# Patient Record
Sex: Male | Born: 1970 | Race: Black or African American | Hispanic: No | Marital: Single | Smoking: Current every day smoker
Health system: Southern US, Community
[De-identification: ages and names within clinical notes are randomized; demographics above are authoritative.]

## PROBLEM LIST (undated history)

## (undated) ENCOUNTER — Emergency Department (HOSPITAL_COMMUNITY): Payer: No Typology Code available for payment source

## (undated) DIAGNOSIS — F1910 Other psychoactive substance abuse, uncomplicated: Secondary | ICD-10-CM

## (undated) DIAGNOSIS — I10 Essential (primary) hypertension: Secondary | ICD-10-CM

## (undated) DIAGNOSIS — F1010 Alcohol abuse, uncomplicated: Secondary | ICD-10-CM

## (undated) HISTORY — DX: Essential (primary) hypertension: I10

## (undated) HISTORY — DX: Other psychoactive substance abuse, uncomplicated: F19.10

---

## 1999-04-01 ENCOUNTER — Encounter: Payer: Self-pay | Admitting: Emergency Medicine

## 1999-04-01 ENCOUNTER — Emergency Department (HOSPITAL_COMMUNITY): Admission: EM | Admit: 1999-04-01 | Discharge: 1999-04-01 | Payer: Self-pay | Admitting: Emergency Medicine

## 1999-04-02 ENCOUNTER — Ambulatory Visit (HOSPITAL_BASED_OUTPATIENT_CLINIC_OR_DEPARTMENT_OTHER): Admission: RE | Admit: 1999-04-02 | Discharge: 1999-04-02 | Payer: Self-pay | Admitting: Oral Surgery

## 2004-02-14 ENCOUNTER — Emergency Department: Payer: Self-pay | Admitting: Emergency Medicine

## 2008-06-18 ENCOUNTER — Emergency Department (HOSPITAL_COMMUNITY): Admission: EM | Admit: 2008-06-18 | Discharge: 2008-06-18 | Payer: Self-pay | Admitting: Emergency Medicine

## 2008-09-09 ENCOUNTER — Emergency Department (HOSPITAL_COMMUNITY): Admission: EM | Admit: 2008-09-09 | Discharge: 2008-09-09 | Payer: Self-pay | Admitting: Emergency Medicine

## 2009-12-02 ENCOUNTER — Emergency Department (HOSPITAL_COMMUNITY): Admission: AC | Admit: 2009-12-02 | Discharge: 2009-12-02 | Payer: Self-pay

## 2010-05-01 LAB — COMPREHENSIVE METABOLIC PANEL
ALT: 21 U/L (ref 0–53)
AST: 24 U/L (ref 0–37)
Albumin: 4.2 g/dL (ref 3.5–5.2)
Alkaline Phosphatase: 54 U/L (ref 39–117)
CO2: 23 meq/L (ref 19–32)
Chloride: 111 meq/L (ref 96–112)
Creatinine, Ser: 1.01 mg/dL (ref 0.4–1.5)
GFR calc Af Amer: >60 mL/min (ref >60)
GFR calc non Af Amer: >60 mL/min (ref >60)
Potassium: 3.5 meq/L (ref 3.5–5.1)
Total Bilirubin: 0.5 mg/dL (ref 0.3–1.2)

## 2010-05-01 LAB — POCT I-STAT, CHEM 8
BUN: 9 mg/dL (ref 6–23)
Calcium, Ion: 1.06 mmol/L — ABNORMAL LOW (ref 1.12–1.32)
Chloride: 110 meq/L (ref 96–112)
Creatinine, Ser: 1.3 mg/dL (ref 0.4–1.5)
Glucose, Bld: 101 mg/dL — ABNORMAL HIGH (ref 70–99)
HCT: 39 % (ref 39.0–52.0)
Potassium: 3.6 meq/L (ref 3.5–5.1)

## 2010-05-01 LAB — TYPE AND SCREEN
ABO/RH(D): O POS
Antibody Screen: NEGATIVE
Unit division: 0
Unit division: 0

## 2010-05-01 LAB — CBC
Hemoglobin: 12.9 g/dL — ABNORMAL LOW (ref 13.0–17.0)
MCH: 29.7 pg (ref 26.0–34.0)
Platelets: 287 10*3/uL (ref 150–400)
RBC: 4.35 MIL/uL (ref 4.22–5.81)
WBC: 4.2 10*3/uL (ref 4.0–10.5)

## 2010-05-26 LAB — RAPID URINE DRUG SCREEN, HOSP PERFORMED
Amphetamines: NOT DETECTED
Benzodiazepines: NOT DETECTED
Cocaine: POSITIVE — AB

## 2010-05-26 LAB — POCT CARDIAC MARKERS
CKMB, poc: 1.6 ng/mL (ref 1.0–8.0)
Myoglobin, poc: 233 ng/mL (ref 12–200)

## 2010-05-28 LAB — DIFFERENTIAL
Basophils Relative: 1 % (ref 0–1)
Monocytes Relative: 8 % (ref 3–12)
Neutro Abs: 3.4 10*3/uL (ref 1.7–7.7)
Neutrophils Relative %: 62 % (ref 43–77)

## 2010-05-28 LAB — CBC
MCHC: 35.2 g/dL (ref 30.0–36.0)
RBC: 4.53 MIL/uL (ref 4.22–5.81)
WBC: 5.4 10*3/uL (ref 4.0–10.5)

## 2010-05-28 LAB — POCT CARDIAC MARKERS
CKMB, poc: 1.4 ng/mL (ref 1.0–8.0)
Myoglobin, poc: 305 ng/mL (ref 12–200)
Troponin i, poc: <0.05 ng/mL (ref 0.00–0.09)

## 2010-05-28 LAB — POCT I-STAT, CHEM 8
Chloride: 109 mEq/L (ref 96–112)
Glucose, Bld: 105 mg/dL — ABNORMAL HIGH (ref 70–99)
HCT: 39 % (ref 39.0–52.0)
Potassium: 3.7 mEq/L (ref 3.5–5.1)

## 2010-05-28 LAB — CK TOTAL AND CKMB (NOT AT ARMC): Relative Index: 0.4 (ref 0.0–2.5)
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
## 2010-07-05 NOTE — Op Note (Signed)
[place]  Patient:    Mark Thornton, Mark Thornton                    MRN: [id number] Proc. Date: 04/02/99 Adm. Date:  [id number] Attending:  Rebeca Alert                           Operative Report  PREOPERATIVE DIAGNOSES: 1. Fracture of right body of mandible. 2. Three abscessed teeth, #21, #30, and the fractured tooth in the fractured site,    #32.  PROCEDURES: 1. Surgical removal of teeth #21, #30, and #32. 2. Closed reduction of fractured right body of mandible with Erich arch bars and    intermaxillary fixation.  ANESTHESIA:  General.  SURGEON:  Hinton Dyer, D.D.S.  ESTIMATED BLOOD LOSS:  Approximately 100 cc.  CONDITION AT THE END OF SURGERY:  Good.  DESCRIPTION OF PROCEDURE:  Following preoperative medication, the patient was brought to the operating room in a supine position, which he remained in throughout the whole procedure.  He was intubated by a left nasal endotracheal tube and then prepped and draped in the usual fashion for an oral procedure.  The mouth was suctioned.  A 4 x 4, open, moist gauze was placed around the endotracheal tube, and the mouth was prepared with Betadine scrub and paint.  Then 1.5 cc of 2% Xylocaine with 1:100,000 epinephrine was infiltrated in the medial aspect of the mandible on the right and left side, and attention was then turned to tooth #21.  A flap was elevated as the tooth was below the gingival margin.  Some bone was removed using a round bur and copious irrigation, and a purchase point was placed into the root. The tooth was gently removed from the socket using a ______ elevator.  The socket was curetted out, including a large, periapical area beneath it.  After copious  irrigation, the area was closed with 3-0 chromic sutures.  Attention was then turned to the right side.  A ______ elevator was placed around tooth #30, and it was removed from the socket with gentle pressure,  irrigating the socket out, and then curettaging the large periapical defects below the roots.  Then copious irrigation was used, and it was closed with 3-0 chromic sutures. Attention was hen turned to the mandibular third molar, which had been split through the fracture  site, and a the lower universal forceps were used to remove the tooth gently. he tooth came out in two pieces, removing the mesial root and then the distal root, then the area was irrigated out, and closed with multiple 3-0 chromic sutures. The mandible was check for integrity and found that the fracture had not displaced.  Attention was then turned to the maxilla, and a 26 gauge stainless steel wire and two curved clamps were used to measure the arch.  An Erich arch bar was then cut to fit the area from tooth #3 to tooth #14.  The Erich arch bar was then fitted to the area, and 24 gauge stainless steel wires were passed around teeth #3, #5, #6, #7, #8, #9, #10, #11, #12, #13, #14, and around the [facility] arch bar, and the tails were rotated into placed, turning them clockwise in the mucobuccal fold.  At that point, attention was then turned to the mandible, and again, a stainless steel wire was used to measure the mandibular arch, extending from tooth #31 to tooth #  18. Once fitted to the area, the wire was passed around teeth #18, #21, #22, #23, #24, #25, #26, #27, #28, #29, and #31.  The wires were then tied around the [facility] arch bar, turned into the mucobuccal fold, and cut appropriately.  The arch bar was checked for stability, and the patient was placed into intermaxillary fixation after removing the throat pack.  Four intermaxillary fixation wires were used, and the mouth was irrigated out.  Also, a [place] sump was placed just to check the integrity that the patient was n.p.o., and then the patient was then extubated on the table, and returned to the recovery room in good condition.  He is being  discharged to  home with a prescription for Lortab elixir x 300 cc, 1-2 tsp q.4h. p.r.n. pain nd Keflex elixir 250 mg/5 cc x 300 cc 2 tsp q.i.d.  He will be followed up by me in my office and will have postoperative x-rays in the office. DD:  04/02/99 TD:  04/02/99 Job: [id number]

## 2012-09-26 ENCOUNTER — Emergency Department (HOSPITAL_COMMUNITY)
Admission: EM | Admit: 2012-09-26 | Discharge: 2012-09-26 | Disposition: A | Discharge status: Home / Self Care | Payer: Self-pay | Attending: Emergency Medicine | Admitting: Emergency Medicine

## 2012-09-26 ENCOUNTER — Encounter (HOSPITAL_COMMUNITY): Payer: Self-pay | Admitting: Emergency Medicine

## 2012-09-26 DIAGNOSIS — K1370 Unspecified lesions of oral mucosa: Secondary | ICD-10-CM | POA: Insufficient documentation

## 2012-09-26 DIAGNOSIS — F17200 Nicotine dependence, unspecified, uncomplicated: Secondary | ICD-10-CM | POA: Insufficient documentation

## 2012-09-26 DIAGNOSIS — K047 Periapical abscess without sinus: Secondary | ICD-10-CM | POA: Insufficient documentation

## 2012-09-26 MED ORDER — AMOXICILLIN 500 MG PO CAPS: 500.0000 mg | ORAL_CAPSULE | Freq: Three times a day (TID) | ORAL | Status: DC

## 2012-09-26 MED ORDER — OXYCODONE-ACETAMINOPHEN 5-325 MG PO TABS
1.0000 | ORAL_TABLET | Freq: Once | ORAL | Status: AC
  Administered 2012-09-26: 1 via ORAL
  Filled 2012-09-26: qty 1

## 2012-09-26 MED ORDER — AMOXICILLIN 500 MG PO CAPS
500.0000 mg | ORAL_CAPSULE | Freq: Once | ORAL | Status: AC
  Administered 2012-09-26: 500 mg via ORAL
  Filled 2012-09-26: qty 1

## 2012-09-26 MED ORDER — OXYCODONE-ACETAMINOPHEN 5-325 MG PO TABS: ORAL_TABLET | ORAL | Status: DC

## 2012-09-26 NOTE — ED Notes (Signed)
Patient presents to ED with c/o left sided mouth pain for 2 days now.

## 2012-09-26 NOTE — ED Provider Notes (Signed)
CSN: [id number]     Arrival date & time 09/26/12  1037 History     First MD Initiated Contact with Patient 09/26/12 1114     Chief Complaint  Patient presents with  . Dental Pain   (Consider location/radiation/quality/duration/timing/severity/associated sxs/prior Treatment) HPI  Mark Thornton is a 42 y.o. male complaining of left-sided mouth pain with swelling worsening over the course of 2 days. Patient has had multiple issues with his teeth and multiple dental abscesses he says that he thinks he is an abscess right now. Patient denies fever, trismus, difficulty swallowing secretions, shortness of breath. Wheezes pain at 10 out of 10, and is exacerbated by chewing and opening jaw. Has been taking Motrin at home with no relief.  History reviewed. No pertinent past medical history. History reviewed. No pertinent past surgical history. History reviewed. No pertinent family history. History  Substance Use Topics  . Smoking status: Current Every Day Smoker    Types: Cigarettes  . Smokeless tobacco: Not on file  . Alcohol Use: Yes    Review of Systems 10 systems reviewed and found to be negative, except as noted in the HPI  Allergies  Review of patient's allergies indicates no known allergies.  Home Medications  No current outpatient prescriptions on file. BP 163/107  Pulse 93  Temp(Src) 98.1 F (36.7 C) (Oral)  Resp 20  SpO2 96% Physical Exam  Nursing note and vitals reviewed. Constitutional: He is oriented to person, place, and time. He appears well-developed and well-nourished. No distress.  HENT:  Head: Normocephalic.  Mouth/Throat: Oropharynx is clear and moist.  Generally very poor dentition, gross abscess to left sided hard palate. Patient is handling their secretions. There is no tenderness to palpation or firmness underneath tongue bilaterally. No trismus.    Eyes: Conjunctivae and EOM are normal. Pupils are equal, round, and reactive to light.  Neck: Normal  range of motion.  Cardiovascular: Normal rate.   Pulmonary/Chest: Effort normal. No stridor.  Abdominal: Soft.  Musculoskeletal: Normal range of motion.  Neurological: He is alert and oriented to person, place, and time.  Psychiatric: He has a normal mood and affect.    ED Course   Procedures (including critical care time)  INCISION AND DRAINAGE Performed by: Wynetta Emery Consent: Verbal consent obtained. Risks and benefits: risks, benefits and alternatives were discussed Type: abscess  Body area: Left heart pallet  Anesthesia: local infiltration  Incision was made with a scalpel.  Local anesthetic: lidocaine 2% with epinephrine  Anesthetic total: 2 ml  Complexity: complex Blunt dissection to break up loculations  Drainage: purulent  Drainage amount: 2 mils   Packing material: None   Patient tolerance: Patient tolerated the procedure well with no immediate complications.     Labs Reviewed - No data to display No results found. 1. Dental abscess     MDM   Filed Vitals:   09/26/12 1047  BP: 163/107  Pulse: 93  Temp: 98.1 F (36.7 C)  TempSrc: Oral  Resp: 20  SpO2: 96%     Mark Thornton is a 42 y.o. male Patient with toothache.  In gross abscess, incision and drainage performed.  Exam unconcerning for Ludwig's angina or spread of infection.  Will treat with penicillin and pain medicine.  Urged patient to follow-up with dentist.     Medications  oxyCODONE-acetaminophen (PERCOCET/ROXICET) 5-325 MG per tablet 1 tablet (1 tablet Oral Given 09/26/12 1153)  amoxicillin (AMOXIL) capsule 500 mg (500 mg Oral Given 09/26/12 1153)    Pt  is hemodynamically stable, appropriate for, and amenable to discharge at this time. Pt verbalized understanding and agrees with care plan. All questions answered. Outpatient follow-up and specific return precautions discussed.    Discharge Medication List as of 09/26/2012 11:48 AM    START taking these medications    Details  amoxicillin (AMOXIL) 500 MG capsule Take 1 capsule (500 mg total) by mouth 3 (three) times daily., Starting 09/26/2012, Until Discontinued, Print    oxyCODONE-acetaminophen (PERCOCET/ROXICET) 5-325 MG per tablet 1 to 2 tabs PO q6hrs  PRN for pain, Print        Note: Portions of this report may have been transcribed using voice recognition software. Every effort was made to ensure accuracy; however, inadvertent computerized transcription errors may be present    Wynetta Emery, PA-C 09/27/12 1104

## 2012-09-27 NOTE — ED Provider Notes (Signed)
Medical screening examination/treatment/procedure(s) were performed by non-physician practitioner and as supervising physician I was immediately available for consultation/collaboration.  Flint Melter, MD 09/27/12 1110

## 2013-02-27 ENCOUNTER — Encounter (HOSPITAL_COMMUNITY): Payer: Self-pay | Admitting: Emergency Medicine

## 2013-02-27 ENCOUNTER — Emergency Department (HOSPITAL_COMMUNITY)
Admission: EM | Admit: 2013-02-27 | Discharge: 2013-02-27 | Disposition: A | Discharge status: Home / Self Care | Payer: Self-pay | Attending: Emergency Medicine | Admitting: Emergency Medicine

## 2013-02-27 DIAGNOSIS — K047 Periapical abscess without sinus: Secondary | ICD-10-CM | POA: Insufficient documentation

## 2013-02-27 DIAGNOSIS — F17200 Nicotine dependence, unspecified, uncomplicated: Secondary | ICD-10-CM | POA: Insufficient documentation

## 2013-02-27 MED ORDER — HYDROMORPHONE HCL PF 1 MG/ML IJ SOLN
2.0000 mg | Freq: Once | INTRAMUSCULAR | Status: AC
  Administered 2013-02-27: 2 mg via INTRAMUSCULAR
  Filled 2013-02-27: qty 2

## 2013-02-27 MED ORDER — PENICILLIN V POTASSIUM 500 MG PO TABS: 500.0000 mg | ORAL_TABLET | Freq: Three times a day (TID) | ORAL | Status: AC

## 2013-02-27 MED ORDER — PENICILLIN G BENZATHINE 1200000 UNIT/2ML IM SUSP
1.2000 10*6.[IU] | Freq: Once | INTRAMUSCULAR | Status: AC
  Administered 2013-02-27: 1.2 10*6.[IU] via INTRAMUSCULAR
  Filled 2013-02-27: qty 2

## 2013-02-27 MED ORDER — OXYCODONE-ACETAMINOPHEN 10-325 MG PO TABS: 0.5000 | ORAL_TABLET | ORAL | Status: DC | PRN

## 2013-02-27 NOTE — ED Notes (Signed)
Paged Dr. Barbette MerinoJensen to [phone]

## 2013-02-27 NOTE — Discharge Instructions (Signed)
You have been diagnosed with an abscess of your hard palate. I have spoken with Dr. Barbette MerinoJensen who is the oral surgeon on call.  He asked that he be given antibiotics.  He was given a shot of penicillin here in the emergency department.  He must fill a prescription for penicillin.  And begin taking it tomorrow morning.  He may also fill the prescription for pain medicine.  Please call Dr. Randa EvensJensen's office first thing tomorrow morning to set up an appointment for the afternoon.  He will see you in his office to drain your abscess.  Please return to the emergency department if you've (increasing swelling, difficulty swallowing, pain with swallowing, difficulty breathing.  Dental Abscess A dental abscess is a collection of infected fluid (pus) from a bacterial infection in the inner part of the tooth (pulp). It usually occurs at the end of the tooth's root.  CAUSES   Severe tooth decay.  Trauma to the tooth that allows bacteria to enter into the pulp, such as a broken or chipped tooth. SYMPTOMS   Severe pain in and around the infected tooth.  Swelling and redness around the abscessed tooth or in the mouth or face.  Tenderness.  Pus drainage.  Bad breath.  Bitter taste in the mouth.  Difficulty swallowing.  Difficulty opening the mouth.  Nausea.  Vomiting.  Chills.  Swollen neck glands. DIAGNOSIS   A medical and dental history will be taken.  An examination will be performed by tapping on the abscessed tooth.  X-rays may be taken of the tooth to identify the abscess. TREATMENT The goal of treatment is to eliminate the infection. You may be prescribed antibiotic medicine to stop the infection from spreading. A root canal may be performed to save the tooth. If the tooth cannot be saved, it may be pulled (extracted) and the abscess may be drained.  HOME CARE INSTRUCTIONS  Only take over-the-counter or prescription medicines for pain, fever, or discomfort as directed by your  caregiver.  Rinse your mouth (gargle) often with salt water ( tsp salt in 8 oz [250 ml] of warm water) to relieve pain or swelling.  Do not drive after taking pain medicine (narcotics).  Do not apply heat to the outside of your face.  Return to your dentist for further treatment as directed. SEEK MEDICAL CARE IF:  Your pain is not helped by medicine.  Your pain is getting worse instead of better. SEEK IMMEDIATE MEDICAL CARE IF:  You have a fever or persistent symptoms for more than 2 3 days.  You have a fever and your symptoms suddenly get worse.  You have chills or a very bad headache.  You have problems breathing or swallowing.  You have trouble opening your mouth.  You have swelling in the neck or around the eye. Document Released: 02/03/2005 Document Revised: 10/29/2011 Document Reviewed: 05/14/2010 [facility] Patient Information 2014 [place], [place].

## 2013-02-27 NOTE — ED Notes (Signed)
Pt is here with left upper dental pain and thinks has abscess.  Pt reports drinking last nite and thinks he may be dehydrated from it.  No vomiting

## 2013-02-27 NOTE — ED Provider Notes (Signed)
CSN: [id number]     Arrival date & time 02/27/13  0848 History   First MD Initiated Contact with Patient 02/27/13 1114     Chief Complaint  Patient presents with  . Dental Problem   (Consider location/radiation/quality/duration/timing/severity/associated sxs/prior Treatment) HPI Mr. Langenbach is a 43 year old male who presents the emergency department chief complaint of dental abscess.  The patient has had these previously.  He states that yesterday he began to notice swelling in his palate.  It has progressively worsened and gotten progressively more swollen.  The patient states that his pain is 10 out of 10.  He's had decreased oral intake.  He denies any pain with swallowing or difficulty breathing.  He is a current daily smoker.  He denies any fever, chills, nausea, myalgias.  History reviewed. No pertinent past medical history. History reviewed. No pertinent past surgical history. No family history on file. History  Substance Use Topics  . Smoking status: Current Every Day Smoker    Types: Cigarettes  . Smokeless tobacco: Not on file  . Alcohol Use: Yes     Comment: daily    Review of Systems Ten systems reviewed and are negative for acute change, except as noted in the HPI. \  Allergies  Review of patient's allergies indicates no known allergies.  Home Medications   Current Outpatient Rx  Name  Route  Sig  Dispense  Refill  . acetaminophen (TYLENOL) 500 MG tablet   Oral   Take 1,000 mg by mouth every 6 (six) hours as needed for moderate pain.          BP 181/101  Pulse 103  Temp(Src) 99.7 F (37.6 C) (Oral)  Resp 18  Wt 195 lb (88.451 kg)  SpO2 99% Physical Exam  Nursing note and vitals reviewed. Constitutional: He appears well-developed and well-nourished. No distress.  Appears older than stated age  HENT:  Head: Normocephalic and atraumatic.  Mouth/Throat: Oropharynx is clear and moist. No oropharyngeal exudate.    Eyes: Conjunctivae and EOM are normal.  Pupils are equal, round, and reactive to light. No scleral icterus.  Neck: Normal range of motion. Neck supple.  Cardiovascular: Normal rate, regular rhythm and normal heart sounds.   Pulmonary/Chest: Effort normal and breath sounds normal. No respiratory distress.  Abdominal: Soft. There is no tenderness.  Musculoskeletal: He exhibits no edema.  Lymphadenopathy:    He has no cervical adenopathy.  Neurological: He is alert.  Skin: Skin is warm and dry. He is not diaphoretic.  Psychiatric: His behavior is normal.    ED Course  Procedures (including critical care time) Labs Review Labs Reviewed - No data to display Imaging Review No results found.  EKG Interpretation   None       MDM   1. Dental abscess    Patient seen in shared visit with Dr. Redgie Grayer.  Large palatine abscess. I have placed call to oral surgery.    1:11 PM Filed Vitals:   02/27/13 0855 02/27/13 0857 02/27/13 1211  BP:  178/115 181/101  Pulse: 105  103  Temp: 98 F (36.7 C)  99.7 F (37.6 C)  TempSrc: Oral  Oral  Resp: 20  18  Weight: 195 lb (88.451 kg)    SpO2: 100%  99%  I've spoken with Dr. Barbette Merino who recommends that the patient be given IM penicillin.  Discharged on amoxicillin.  He is to followup at Dr. Randa Evens office tomorrow afternoon for drainage of the abscess. The patient is given strong warning  precautions to include increasing swelling, pain with swallowing, difficulty breathing.  If any of these occur he is to return to the emergency department immediately.  Patient is without any airway compromise at this time.      Arthor CaptainAbigail Blease Capaldi, PA-C 03/02/13 1100

## 2013-03-02 NOTE — ED Provider Notes (Signed)
Medical screening examination/treatment/procedure(s) were conducted as a shared visit with non-physician practitioner(s) and myself.  I personally evaluated the patient during the encounter.  EKG Interpretation   None       43 year old male with a dental abscess which is on the hard palate.  This is an odontogenic abscess. Patient has poor dentition. Vital signs are stable. He is afebrile. He has no airway compromise. Is oropharynx is otherwise unremarkable for soft tissue mass, abscess.  The floor of his mouth is soft. His tongue is normal. Tonsils and uvula are unremarkable. No voice issues. No airway issues. Patient is to followup with dental and the next morning based on discussion with dental.  Darlys Galesavid Branden Shallenberger, MD 03/02/13 [phone]

## 2013-07-05 ENCOUNTER — Emergency Department (HOSPITAL_COMMUNITY): Payer: No Typology Code available for payment source

## 2013-07-05 ENCOUNTER — Encounter (HOSPITAL_COMMUNITY): Payer: Self-pay | Admitting: Emergency Medicine

## 2013-07-05 ENCOUNTER — Emergency Department (HOSPITAL_COMMUNITY)
Admission: EM | Admit: 2013-07-05 | Discharge: 2013-07-06 | Disposition: A | Discharge status: Home / Self Care | Payer: No Typology Code available for payment source | Attending: Emergency Medicine | Admitting: Emergency Medicine

## 2013-07-05 DIAGNOSIS — IMO0002 Reserved for concepts with insufficient information to code with codable children: Secondary | ICD-10-CM | POA: Insufficient documentation

## 2013-07-05 DIAGNOSIS — S199XXA Unspecified injury of neck, initial encounter: Principal | ICD-10-CM

## 2013-07-05 DIAGNOSIS — Y9389 Activity, other specified: Secondary | ICD-10-CM | POA: Insufficient documentation

## 2013-07-05 DIAGNOSIS — R9389 Abnormal findings on diagnostic imaging of other specified body structures: Secondary | ICD-10-CM | POA: Insufficient documentation

## 2013-07-05 DIAGNOSIS — F17200 Nicotine dependence, unspecified, uncomplicated: Secondary | ICD-10-CM | POA: Insufficient documentation

## 2013-07-05 DIAGNOSIS — Y92410 Unspecified street and highway as the place of occurrence of the external cause: Secondary | ICD-10-CM | POA: Insufficient documentation

## 2013-07-05 DIAGNOSIS — M542 Cervicalgia: Secondary | ICD-10-CM

## 2013-07-05 DIAGNOSIS — S0993XA Unspecified injury of face, initial encounter: Secondary | ICD-10-CM | POA: Insufficient documentation

## 2013-07-05 MED ORDER — OXYCODONE-ACETAMINOPHEN 5-325 MG PO TABS
1.0000 | ORAL_TABLET | Freq: Once | ORAL | Status: AC
  Administered 2013-07-05: 1 via ORAL
  Filled 2013-07-05: qty 1
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
## 2013-07-05 NOTE — ED Notes (Signed)
Pt returned to room from CT

## 2013-07-06 MED ORDER — OXYCODONE-ACETAMINOPHEN 5-325 MG PO TABS: 1.0000 | ORAL_TABLET | ORAL | Status: DC | PRN

## 2013-07-06 MED ORDER — CYCLOBENZAPRINE HCL 10 MG PO TABS: 10.0000 mg | ORAL_TABLET | Freq: Two times a day (BID) | ORAL | Status: DC | PRN

## 2013-07-06 NOTE — Discharge Instructions (Signed)
Cervical Sprain °A cervical sprain is an injury in the neck in which the strong, fibrous tissues (ligaments) that connect your neck bones stretch or tear. Cervical sprains can range from mild to severe. Severe cervical sprains can cause the neck vertebrae to be unstable. This can lead to damage of the spinal cord and can result in serious nervous system problems. The amount of time it takes for a cervical sprain to get better depends on the cause and extent of the injury. Most cervical sprains heal in 1 to 3 weeks. °CAUSES  °Severe cervical sprains may be caused by:  °· Contact sport injuries (such as from football, rugby, wrestling, hockey, auto racing, gymnastics, diving, martial arts, or boxing).   °· Motor vehicle collisions.   °· Whiplash injuries. This is an injury from a sudden forward-and backward whipping movement of the head and neck.  °· Falls.   °Mild cervical sprains may be caused by:  °· Being in an awkward position, such as while cradling a telephone between your ear and shoulder.   °· Sitting in a chair that does not offer proper support.   °· Working at a poorly designed computer station.   °· Looking up or down for long periods of time.   °SYMPTOMS  °· Pain, soreness, stiffness, or a burning sensation in the front, back, or sides of the neck. This discomfort may develop immediately after the injury or slowly, 24 hours or more after the injury.   °· Pain or tenderness directly in the middle of the back of the neck.   °· Shoulder or upper back pain.   °· Limited ability to move the neck.   °· Headache.   °· Dizziness.   °· Weakness, numbness, or tingling in the hands or arms.   °· Muscle spasms.   °· Difficulty swallowing or chewing.   °· Tenderness and swelling of the neck.   °DIAGNOSIS  °Most of the time your health care provider can diagnose a cervical sprain by taking your history and doing a physical exam. Your health care provider will ask about previous neck injuries and any known neck  problems, such as arthritis in the neck. X-rays may be taken to find out if there are any other problems, such as with the bones of the neck. Other tests, such as a CT scan or MRI, may also be needed.  °TREATMENT  °Treatment depends on the severity of the cervical sprain. Mild sprains can be treated with rest, keeping the neck in place (immobilization), and pain medicines. Severe cervical sprains are immediately immobilized. Further treatment is done to help with pain, muscle spasms, and other symptoms and may include: °· Medicines, such as pain relievers, numbing medicines, or muscle relaxants.   °· Physical therapy. This may involve stretching exercises, strengthening exercises, and posture training. Exercises and improved posture can help stabilize the neck, strengthen muscles, and help stop symptoms from returning.   °HOME CARE INSTRUCTIONS  °· Put ice on the injured area.   °· Put ice in a plastic bag.   °· Place a towel between your skin and the bag.   °· Leave the ice on for 15 20 minutes, 3 4 times a day.   °· If your injury was severe, you may have been given a cervical collar to wear. A cervical collar is a two-piece collar designed to keep your neck from moving while it heals. °· Do not remove the collar unless instructed by your health care provider. °· If you have long hair, keep it outside of the collar. °· Ask your health care provider before making any adjustments to your collar.   Minor adjustments may be required over time to improve comfort and reduce pressure on your chin or on the back of your head.  Ifyou are allowed to remove the collar for cleaning or bathing, follow your health care provider's instructions on how to do so safely.  Keep your collar clean by wiping it with mild soap and water and drying it completely. If the collar you have been given includes removable pads, remove them every 1 2 days and hand wash them with soap and water. Allow them to air dry. They should be completely  dry before you wear them in the collar.  If you are allowed to remove the collar for cleaning and bathing, wash and dry the skin of your neck. Check your skin for irritation or sores. If you see any, tell your health care provider.  Do not drive while wearing the collar.   Only take over-the-counter or prescription medicines for pain, discomfort, or fever as directed by your health care provider.   Keep all follow-up appointments as directed by your health care provider.   Keep all physical therapy appointments as directed by your health care provider.   Make any needed adjustments to your workstation to promote good posture.   Avoid positions and activities that make your symptoms worse.   Warm up and stretch before being active to help prevent problems.  SEEK MEDICAL CARE IF:   Your pain is not controlled with medicine.   You are unable to decrease your pain medicine over time as planned.   Your activity level is not improving as expected.  SEEK IMMEDIATE MEDICAL CARE IF:   You develop any bleeding.  You develop stomach upset.  You have signs of an allergic reaction to your medicine.   Your symptoms get worse.   You develop new, unexplained symptoms.   You have numbness, tingling, weakness, or paralysis in any part of your body.  MAKE SURE YOU:   Understand these instructions.  Will watch your condition.  Will get help right away if you are not doing well or get worse. Document Released: 12/01/2006 Document Revised: 11/24/2012 Document Reviewed: 08/11/2012 [facility] Patient Information 2014 [place], [place]. Cryotherapy Cryotherapy means treatment with cold. Ice or gel packs can be used to reduce both pain and swelling. Ice is the most helpful within the first 24 to 48 hours after an injury or flareup from overusing a muscle or joint. Sprains, strains, spasms, burning pain, shooting pain, and aches can all be eased with ice. Ice can also be used when  recovering from surgery. Ice is effective, has very few side effects, and is safe for most people to use. PRECAUTIONS  Ice is not a safe treatment option for people with:  Raynaud's phenomenon. This is a condition affecting small blood vessels in the extremities. Exposure to cold may cause your problems to return.  Cold hypersensitivity. There are many forms of cold hypersensitivity, including:  Cold urticaria. Red, itchy hives appear on the skin when the tissues begin to warm after being iced.  Cold erythema. This is a red, itchy rash caused by exposure to cold.  Cold hemoglobinuria. Red blood cells break down when the tissues begin to warm after being iced. The hemoglobin that carry oxygen are passed into the urine because they cannot combine with blood proteins fast enough.  Numbness or altered sensitivity in the area being iced. If you have any of the following conditions, do not use ice until you have discussed cryotherapy with your  your caregiver: °· Heart conditions, such as arrhythmia, angina, or chronic heart disease. °· High blood pressure. °· Healing wounds or open skin in the area being iced. °· Current infections. °· Rheumatoid arthritis. °· Poor circulation. °· Diabetes. °Ice slows the blood flow in the region it is applied. This is beneficial when trying to stop inflamed tissues from spreading irritating chemicals to surrounding tissues. However, if you expose your skin to cold temperatures for too long or without the proper protection, you can damage your skin or nerves. Watch for signs of skin damage due to cold. °HOME CARE INSTRUCTIONS °Follow these tips to use ice and cold packs safely. °· Place a dry or damp towel between the ice and skin. A damp towel will cool the skin more quickly, so you may need to shorten the time that the ice is used. °· For a more rapid response, add gentle compression to the ice. °· Ice for no more than 10 to 20 minutes at a time. The bonier the area you are  icing, the less time it will take to get the benefits of ice. °· Check your skin after 5 minutes to make sure there are no signs of a poor response to cold or skin damage. °· Rest 20 minutes or more in between uses. °· Once your skin is numb, you can end your treatment. You can test numbness by very lightly touching your skin. The touch should be so light that you do not see the skin dimple from the pressure of your fingertip. When using ice, most people will feel these normal sensations in this order: cold, burning, aching, and numbness. °· Do not use ice on someone who cannot communicate their responses to pain, such as small children or people with dementia. °HOW TO MAKE AN ICE PACK °Ice packs are the most common way to use ice therapy. Other methods include ice massage, ice baths, and cryo-sprays. Muscle creams that cause a cold, tingly feeling do not offer the same benefits that ice offers and should not be used as a substitute unless recommended by your caregiver. °To make an ice pack, do one of the following: °· Place crushed ice or a bag of frozen vegetables in a sealable plastic bag. Squeeze out the excess air. Place this bag inside another plastic bag. Slide the bag into a pillowcase or place a damp towel between your skin and the bag. °· Mix 3 parts water with 1 part rubbing alcohol. Freeze the mixture in a sealable plastic bag. When you remove the mixture from the freezer, it will be slushy. Squeeze out the excess air. Place this bag inside another plastic bag. Slide the bag into a pillowcase or place a damp towel between your skin and the bag. °SEEK MEDICAL CARE IF: °· You develop white spots on your skin. This may give the skin a blotchy (mottled) appearance. °· Your skin turns blue or pale. °· Your skin becomes waxy or hard. °· Your swelling gets worse. °MAKE SURE YOU:  °· Understand these instructions. °· Will watch your condition. °· Will get help right away if you are not doing well or get  worse. °Document Released: 09/30/2010 Document Revised: 04/28/2011 Document Reviewed: 09/30/2010 °[employer]® Patient Information ©2014 [employer]. ° °

## 2013-07-06 NOTE — ED Provider Notes (Signed)
Medical screening examination/treatment/procedure(s) were performed by non-physician practitioner and as supervising physician I was immediately available for consultation/collaboration.   EKG Interpretation None        [place] S Jaymes Revels, MD 07/06/13 2101 

## 2013-10-20 ENCOUNTER — Emergency Department (HOSPITAL_COMMUNITY)
Admission: EM | Admit: 2013-10-20 | Discharge: 2013-10-20 | Disposition: A | Discharge status: Home / Self Care | Payer: No Typology Code available for payment source | Attending: Emergency Medicine | Admitting: Emergency Medicine

## 2013-10-20 ENCOUNTER — Encounter (HOSPITAL_COMMUNITY): Payer: Self-pay | Admitting: Emergency Medicine

## 2013-10-20 DIAGNOSIS — K029 Dental caries, unspecified: Secondary | ICD-10-CM | POA: Insufficient documentation

## 2013-10-20 DIAGNOSIS — K089 Disorder of teeth and supporting structures, unspecified: Secondary | ICD-10-CM | POA: Insufficient documentation

## 2013-10-20 DIAGNOSIS — K122 Cellulitis and abscess of mouth: Secondary | ICD-10-CM | POA: Insufficient documentation

## 2013-10-20 DIAGNOSIS — F17200 Nicotine dependence, unspecified, uncomplicated: Secondary | ICD-10-CM | POA: Insufficient documentation

## 2013-10-20 MED ORDER — OXYCODONE-ACETAMINOPHEN 5-325 MG PO TABS
2.0000 | ORAL_TABLET | Freq: Once | ORAL | Status: AC
  Administered 2013-10-20: 2 via ORAL
  Filled 2013-10-20: qty 2

## 2013-10-20 MED ORDER — HYDROCODONE-ACETAMINOPHEN 5-325 MG PO TABS: 1.0000 | ORAL_TABLET | Freq: Four times a day (QID) | ORAL | Status: DC | PRN

## 2013-10-20 MED ORDER — AMOXICILLIN-POT CLAVULANATE 875-125 MG PO TABS: 1.0000 | ORAL_TABLET | Freq: Two times a day (BID) | ORAL | Status: DC

## 2013-10-20 NOTE — Discharge Instructions (Signed)
Abscessed Tooth °An abscessed tooth is an infection around your tooth. It may be caused by holes or damage to the tooth (cavity) or a dental disease. An abscessed tooth causes mild to very bad pain in and around the tooth. See your dentist right away if you have tooth or gum pain. °HOME CARE °· Take your medicine as told. Finish it even if you start to feel better. °· Do not drive after taking pain medicine. °· Rinse your mouth (gargle) often with salt water (¼ teaspoon salt in 8 ounces of warm water). °· Do not apply heat to the outside of your face. °GET HELP RIGHT AWAY IF:  °· You have a temperature by mouth above 102° F (38.9° C), not controlled by medicine. °· You have chills and a very bad headache. °· You have problems breathing or swallowing. °· Your mouth will not open. °· You develop puffiness (swelling) on the neck or around the eye. °· Your pain is not helped by medicine. °· Your pain is getting worse instead of better. °MAKE SURE YOU:  °· Understand these instructions. °· Will watch your condition. °· Will get help right away if you are not doing well or get worse. °Document Released: 07/23/2007 Document Revised: 04/28/2011 Document Reviewed: 05/14/2010 °ExitCare® Patient Information ©2015 [employer]. This information is not intended to replace advice given to you by your health care provider. Make sure you discuss any questions you have with your health care provider. ° °Emergency Department Resource Guide °1) Find a Doctor and Pay Out of Pocket °Although you won't have to find out who is covered by your insurance plan, it is a good idea to ask around and get recommendations. You will then need to call the office and see if the doctor you have chosen will accept you as a new patient and what types of options they offer for patients who are self-pay. Some doctors offer discounts or will set up payment plans for their patients who do not have insurance, but you will need to ask so you aren't surprised  when you get to your appointment. ° °2) Contact Your Local Health Department °Not all health departments have doctors that can see patients for sick visits, but many do, so it is worth a call to see if yours does. If you don't know where your local health department is, you can check in your phone book. The CDC also has a tool to help you locate your state's health department, and many state websites also have listings of all of their local health departments. ° °3) Find a Walk-in Clinic °If your illness is not likely to be very severe or complicated, you may want to try a walk in clinic. These are popping up all over the country in pharmacies, drugstores, and shopping centers. They're usually staffed by nurse practitioners or physician assistants that have been trained to treat common illnesses and complaints. They're usually fairly quick and inexpensive. However, if you have serious medical issues or chronic medical problems, these are probably not your best option. ° °No Primary Care Doctor: °- Call [facility] at  832-8000 - they can help you locate a primary care doctor that  accepts your insurance, provides certain services, etc. °- Physician Referral Service- 1-800-533-3463 ° °Chronic Pain Problems: °Organization         Address  Phone   Notes  °[facility]  (336) 297-2271 Patients need to be referred by their primary care doctor.  ° °  Medication Assistance: °Organization         Address  Phone   Notes  °[place] [facility] [address] °[place], [place] [address] (336) 641-8030 --Must be a resident of [place] °-- Must have NO insurance coverage whatsoever (no Medicaid/ Medicare, etc.) °-- The pt. MUST have a primary care doctor that directs their care regularly and follows them in the community °  °[facility]  (866) 331-1348   °[employer]  (888) 892-1162   ° °Agencies that provide inexpensive medical care: °Organization          Address  Phone   Notes  °[facility]  (336) 832-8035   °[facility]    (336) 832-7272   °[facility] [address] °[place], [place] [address] (336) 832-4777   °[facility] [address], °[place] (336) 271-4999   °[facility]    (336) 373-0678   °[facility]    (336) 272-1050   °[facility] ° [address], [place] Phone:  (336) 832-4444, Fax:  (336) 832-4440 Hours of Operation:  9 am - 6 pm, M-F.  Also accepts Medicaid/Medicare and self-pay.  °[facility] ° [address], [place] Phone: (336) 832-3150, Fax: (336) 832-3151. Hours of Operation:  8:30 am - 5:30 pm, M-F.  Also accepts Medicaid and self-pay.  °[facility] [place] [address], [place] Phone: (336) 878-6027   °[facility] [address], [place], [place] (336)723-1848, Ext. 123 Mondays & Thursdays: 7-9 AM.  First 15 patients are seen on a first come, first serve basis. °  ° °Medicaid-accepting [place] Providers: ° °Organization         Address  Phone   Notes  °[facility] [address], [place] (336) 641-2100 Also accepts self-pay patients.  °[facility] [address], [place] ° (336) 856-9996   °[facility] [address], [place] (336) 288-8857   °[facility] [address] [place] [address], [place] (336) 299-7000   °Veita Bland [address], [place]  ° (336) 373-1557 Only accepts [place] Access Medicaid patients after they have their name applied to their card.  ° °Self-Pay (no insurance) in [place]: ° °Organization         Address  Phone   Notes  °Sickle Cell Patients, [facility] [address], [place] (336) 832-1970   °[facility] [address], [place] (336) 832-4400    °[facility] ° [address], [place] (336) 992-4800   °[facility]/Dr. Osei-Bonsu ° [address] [place] [address], [place] or [address], [place] (336) 841-8500 Phone number for both [place] and [place] locations is the same.  °[facility] [address], [place] (336) 299-0000   °[facility] [address] [place] [address], [place] or [address] (336) 852-7530 °(336) 878-2260   °[facility] [address], [place] (336) 350-1642, phone; (336) 294-5005, fax Sees patients 1st and 3rd Saturday of every month.  Must not qualify for public or private insurance (i.e. Medicaid, Medicare, [place] Health Choice, Veterans' Benefits) • Household income should be no more than 200% of the poverty level •The   clinic cannot treat you if you are pregnant or think you are pregnant • Sexually transmitted diseases are not treated at the clinic.  ° ° °Dental Care: °Organization         Address  Phone  Notes  °[facility] [address], [place] (336) 641-6152 Accepts children up to age 21 who are enrolled in Medicaid or [place] Health Choice; pregnant women with a Medicaid card; and children who have applied for Medicaid or [place] Health Choice, but were declined, whose parents can pay a reduced fee at time of service.  °[facility] [place]  [address], [place] (336) 641-7733 Accepts children up to age 21 who are enrolled in Medicaid or [place] Health Choice; pregnant women with a Medicaid card; and children who have applied for Medicaid or [place] Health Choice, but were declined, whose parents can pay a reduced fee at time of service.  °[facility] ° [address], [place] (336) 641-4533 Patients are seen by appointment only. Walk-ins are not accepted. [facility] will see patients 18  years of age and older. °Monday - Tuesday (8am-5pm) °Most Wednesdays (8:30-5pm) °$30 per visit, cash only  °[facility] ° [address], [place] (336) 641-4533 Patients are seen by appointment only. Walk-ins are not accepted. [facility] will see patients 18 years of age and older. °One Wednesday Evening (Monthly: Volunteer Based).  $30 per visit, cash only  °[facility]  (919) 537-3737 for adults; Children under age 4, call Graduate Pediatric Dentistry at (919) [phone]. Children aged 4-14, please call (919) [phone] to request a pediatric application. ° Dental services are provided in all areas of dental care including fillings, crowns and bridges, complete and partial dentures, implants, gum treatment, root canals, and extractions. Preventive care is also provided. Treatment is provided to both adults and children. °Patients are selected via a lottery and there is often a waiting list. °  °[facility] [address], °[place] ° (336) 763-8833 [email or web address] °  °[facility] [address], [place], [place] (336)723-1848, Ext. 123 Second and Fourth Thursday of each month, opens at 6:30 AM; Clinic ends at 9 AM.  Patients are seen on a first-come first-served basis, and a limited number are seen during each clinic.  ° °[facility] ° [address], [place], [place] (336) 723-7904   Eligibility Requirements °You must have lived in [place], [place], or [place] counties for at least the last three months. °  You cannot be eligible for state or federal sponsored healthcare insurance, including Veterans Administration, Medicaid, or Medicare. °  You generally cannot be eligible for healthcare insurance through your employer.  °  How to apply: °Eligibility screenings are held every Tuesday and Wednesday afternoon from 1:00 pm until 4:00 pm. You do not need an appointment for the interview!  °[facility]  [address], [place], [place] 336-631-2330   °[facility]  336-342-8273   °[facility]  336-703-3100   °[facility]  336-570-6415   ° °Behavioral Health Resources in the Community: °Intensive Outpatient Programs °Organization         Address  Phone  Notes  °[facility] [address], [place], [place] 336-878-6098   °[facility] [address], [place],   [place] 336-832-9800   °[facility] [address], [place], [place] ° 336-882-2125   °[facility] [address],  °[place], [place] 1-800-853-5163 or 336-641-4981   °Substance Abuse Resources °Organization         Address  Phone  Notes  °[facility]  336-882-2125   °[facility]  336-784-9470   °[facility]  336-285-9073   °[facility]  336-845-3988   °Residential & Outpatient Substance Abuse Program  1-800-659-3381   °Psychological Services °Organization         Address  Phone  Notes  °[facility]  336- 832-9600   °[facility]  336- 378-7881   °[facility] [address], [place] 1-800-853-5163 or 336-641-4981   ° °Mobile Crisis Teams °Organization         Address  Phone  Notes  °[facility], Mobile Crisis Care Unit  1-877-626-1772   °Assertive °[facility] ° [address] [place], [place] 336-834-9664   °Sharon DeEsch [address] °[place] 336-554-5454   ° °Self-Help/Support Groups °Organization         Address  Phone             Notes  °[facility] - variety of support groups  336- 373-1402 Call for more information  °Narcotics Anonymous (NA), [facility] [address], °[place]  2 meetings at this location  ° °Residential Treatment Programs °Organization         Address  Phone  Notes  °[facility] [address],    °[place]   1-866-801-8205   °[facility] ° [address], [place], [place] 704-293-8524   °[facility] [address], [place] 336-845-3988 Admissions: 8am-3pm M-F  °[facility] [address]    °[place], [place] 336-841-1104   °[facility] [address], [place], [place] 336-379-7146   °[facility] [address]  °[place], [place] 336-285-9073   °[facility] Intensive Outpatient [address], [place], [place] 336-852-3033   °[facility] [address]  °[place], [place] 1-877-615-2722 or 336-784-9470   °[facility] [address] [place], [place] 336-227-7417 Accepts Medicaid  °[facility] [address]  °[place] 1-800-659-3381 Substance Abuse/Addiction Treatment  ° °[place] Behavioral Health Resources °Organization         Address  Phone  Notes  °[facility]  (888) 581-9988   °Julie Brannon, PhD [address], [place]   (336) 349-5553 or (336) 951-0000   °[facility]   [address] °[place], [place] (336) 349-4454   °[facility] [address], [place], [place] (336) 342-8316 Insurance/Medicaid/sponsorship through [facility]  °[facility] [address]                                    [place], [place] (336) 342-8316 Therapy/tele-psych/case  °[facility] [address]  ° [place], [place] (336) 349-2233    °Dr. Arfeen  (336) 349-4544   °[facility] [place]  [employer] [place] [facility] 1) [address], [place] °2) [address], [place] °3)  [address], [place] (336) 349-3220 °(336) 342-7768 ° °(  336) 342-8140   °[place] [facility] (336) 342-1394 or (336) 342-3537 (After Hours)    ° ° °

## 2013-10-20 NOTE — ED Provider Notes (Signed)
CSN: [id number]     Arrival date & time 10/20/13  2157 History   First MD Initiated Contact with Patient 10/20/13 2234     Chief Complaint  Patient presents with  . Dental Pain     (Consider location/radiation/quality/duration/timing/severity/associated sxs/prior Treatment) HPI Comments: Them reporting return of oral abscess on his hard palate, which has been seen here twice previously for same complaint.  After his last visit.  He was given antibiotics and advised to followup with dental/oral surgery.  He reports taking the antibiotics, but not following up with dentistry as his pain resolved.  His symptoms are completely resolved until yesterday morning when he began noticing return of similar pain that progressed today with return of hard palate swelling.   Patient is a 43 y.o. male presenting with tooth pain. The history is provided by the patient.  Dental Pain Location:  Upper Quality:  Pulsating Severity:  Severe Onset quality:  Gradual Duration:  1 day Progression:  Worsening Chronicity:  Recurrent Context: abscess, dental caries and poor dentition   Relieved by:  Nothing Worsened by:  Cold food/drink, touching and pressure Ineffective treatments:  Acetaminophen and NSAIDs Associated symptoms: no congestion, no difficulty swallowing, no drooling, no facial pain, no facial swelling, no fever, no headaches, no neck pain, no neck swelling and no trismus   Risk factors: periodontal disease   Risk factors: no immunosuppression     History reviewed. No pertinent past medical history. History reviewed. No pertinent past surgical history. History reviewed. No pertinent family history. History  Substance Use Topics  . Smoking status: Current Every Day Smoker    Types: Cigarettes  . Smokeless tobacco: Not on file  . Alcohol Use: Yes     Comment: daily    Review of Systems  Constitutional: Negative for fever and chills.  HENT: Positive for dental problem. Negative for  congestion, drooling, facial swelling and trouble swallowing.   Respiratory: Negative for choking, shortness of breath and stridor.   Musculoskeletal: Negative for neck pain.  Neurological: Negative for headaches.      Allergies  Review of patient's allergies indicates no known allergies.  Home Medications   Prior to Admission medications   Medication Sig Start Date End Date Taking? Authorizing Provider  amoxicillin-clavulanate (AUGMENTIN) 875-125 MG per tablet Take 1 tablet by mouth 2 (two) times daily. 10/20/13   Jamal Collin, MD  cyclobenzaprine (FLEXERIL) 10 MG tablet Take 1 tablet (10 mg total) by mouth 2 (two) times daily as needed for muscle spasms. 07/06/13   Shari A Upstill, PA-C  HYDROcodone-acetaminophen (NORCO) 5-325 MG per tablet Take 1 tablet by mouth every 6 (six) hours as needed for moderate pain. 10/20/13   Jamal Collin, MD  oxyCODONE-acetaminophen (PERCOCET/ROXICET) 5-325 MG per tablet Take 1-2 tablets by mouth every 4 (four) hours as needed for severe pain. 07/06/13   Shari A Upstill, PA-C   BP 147/87  Pulse 81  Temp(Src) 97.3 F (36.3 C)  Resp 20  Wt 186 lb 6 oz (84.539 kg)  SpO2 100% Physical Exam  HENT:  Nose: Nose normal.  Mouth/Throat: No trismus in the jaw. Abnormal dentition. Dental abscesses and dental caries present. No uvula swelling.    Neck: Neck supple.  Cardiovascular: Normal rate and regular rhythm.   Pulmonary/Chest: Effort normal. No stridor.    ED Course  Procedures (including critical care time) Labs Review Labs Reviewed - No data to display  Imaging Review No results found.   EKG Interpretation None  MDM   Final diagnoses:  Abscess of oral tissue   Hard palate abscess - Augmentin x 7 days - Norco # 30 for pain - Advised to follow up with a dentist this week as previously recommended - Resources provided    Jamal Collin, MD 10/20/13 2304

## 2013-10-20 NOTE — ED Notes (Signed)
Pt in c/o toothache since yesterday, denies fever

## 2013-10-22 NOTE — ED Provider Notes (Signed)
I saw and evaluated the patient, reviewed the resident's note and I agree with the findings and plan. Patient is a 43 year old male who presents with complaints of pain and swelling to the roof of his mouth. He has a history of a palate abscess that presented similar to this.  On exam, vitals are stable and the patient is afebrile. He is nontoxic-appearing. Head is atraumatic, normocephalic. Neck is supple without adenopathy. He does have a fluctuant, swollen area to the roof of his mouth. He has poor dentition throughout. There is no facial swelling.  This appears to be an abscess to the hard palate. I was able to apply pressure and expressed pus. He will be treated with antibiotics, pain medication, and followup with dentistry.       Geoffery Lyons, MD 10/22/13 [phone]

## 2013-11-30 ENCOUNTER — Encounter (HOSPITAL_COMMUNITY): Payer: Self-pay | Admitting: Emergency Medicine

## 2013-11-30 ENCOUNTER — Emergency Department (HOSPITAL_COMMUNITY)
Admission: EM | Admit: 2013-11-30 | Discharge: 2013-11-30 | Disposition: A | Discharge status: Home / Self Care | Payer: No Typology Code available for payment source | Attending: Emergency Medicine | Admitting: Emergency Medicine

## 2013-11-30 DIAGNOSIS — Z720 Tobacco use: Secondary | ICD-10-CM | POA: Insufficient documentation

## 2013-11-30 DIAGNOSIS — K602 Anal fissure, unspecified: Secondary | ICD-10-CM | POA: Insufficient documentation

## 2013-11-30 LAB — COMPREHENSIVE METABOLIC PANEL
ALT: 19 U/L (ref 0–53)
AST: 26 U/L (ref 0–37)
Albumin: 4 g/dL (ref 3.5–5.2)
Alkaline Phosphatase: 69 U/L (ref 39–117)
Anion gap: 17 — ABNORMAL HIGH (ref 5–15)
BUN: 8 mg/dL (ref 6–23)
CO2: 24 meq/L (ref 19–32)
Calcium: 9.3 mg/dL (ref 8.4–10.5)
Chloride: 96 mEq/L (ref 96–112)
Creatinine, Ser: 0.83 mg/dL (ref 0.50–1.35)
GFR calc Af Amer: >90 mL/min (ref >90)
GFR calc non Af Amer: >90 mL/min (ref >90)
Glucose, Bld: 131 mg/dL — ABNORMAL HIGH (ref 70–99)
Potassium: 3.5 mEq/L — ABNORMAL LOW (ref 3.7–5.3)
SODIUM: 137 meq/L (ref 137–147)
TOTAL PROTEIN: 8.9 g/dL — AB (ref 6.0–8.3)
Total Bilirubin: 0.4 mg/dL (ref 0.3–1.2)

## 2013-11-30 LAB — CBC WITH DIFFERENTIAL/PLATELET
BASOS ABS: 0 10*3/uL (ref 0.0–0.1)
Basophils Relative: 0 % (ref 0–1)
EOS PCT: 1 % (ref 0–5)
Eosinophils Absolute: 0.1 10*3/uL (ref 0.0–0.7)
HCT: 36.5 % — ABNORMAL LOW (ref 39.0–52.0)
Hemoglobin: 13.2 g/dL (ref 13.0–17.0)
LYMPHS PCT: 13 % (ref 12–46)
Lymphs Abs: 1.3 10*3/uL (ref 0.7–4.0)
MCH: 30 pg (ref 26.0–34.0)
MCHC: 36.2 g/dL — ABNORMAL HIGH (ref 30.0–36.0)
MCV: 83 fL (ref 78.0–100.0)
Monocytes Absolute: 1.1 10*3/uL — ABNORMAL HIGH (ref 0.1–1.0)
Monocytes Relative: 12 % (ref 3–12)
NEUTROS PCT: 74 % (ref 43–77)
Neutro Abs: 7.4 10*3/uL (ref 1.7–7.7)
PLATELETS: 232 10*3/uL (ref 150–400)
RBC: 4.4 MIL/uL (ref 4.22–5.81)
RDW: 11.6 % (ref 11.5–15.5)
WBC: 10 10*3/uL (ref 4.0–10.5)

## 2013-11-30 MED ORDER — HYDROCODONE-ACETAMINOPHEN 5-325 MG PO TABS
1.0000 | ORAL_TABLET | Freq: Once | ORAL | Status: AC
  Administered 2013-11-30: 1 via ORAL
  Filled 2013-11-30: qty 1

## 2013-11-30 MED ORDER — POTASSIUM CHLORIDE CRYS ER 20 MEQ PO TBCR
40.0000 meq | EXTENDED_RELEASE_TABLET | Freq: Once | ORAL | Status: AC
  Administered 2013-11-30: 40 meq via ORAL
  Filled 2013-11-30: qty 2

## 2013-11-30 MED ORDER — HYDROCODONE-ACETAMINOPHEN 5-325 MG PO TABS: 1.0000 | ORAL_TABLET | Freq: Four times a day (QID) | ORAL | Status: DC | PRN

## 2013-11-30 MED ORDER — DOCUSATE SODIUM 100 MG PO CAPS: 100.0000 mg | ORAL_CAPSULE | Freq: Two times a day (BID) | ORAL | Status: DC

## 2013-11-30 NOTE — ED Provider Notes (Signed)
CSN: [id number]     Arrival date & time 11/30/13  2029 History   First MD Initiated Contact with Patient 11/30/13 2130     Chief Complaint  Patient presents with  . Rectal Pain     (Consider location/radiation/quality/duration/timing/severity/associated sxs/prior Treatment) HPI Patient complains of painful bowel movements for the past 2 days.pain is at anus, he denies any blood per rectum since 2 months no abdominal pain no other associated symptoms. No treatment prior to coming here. Pain worse with bowel movements oral worse with lying on her not improved by anything History reviewed. No pertinent past medical history. History reviewed. No pertinent past surgical history. No family history on file. History  Substance Use Topics  . Smoking status: Current Every Day Smoker    Types: Cigarettes  . Smokeless tobacco: Not on file  . Alcohol Use: Yes     Comment: daily   positive marijuana use  Review of Systems  Gastrointestinal: Positive for rectal pain.  All other systems reviewed and are negative.     Allergies  Review of patient's allergies indicates no known allergies.  Home Medications   Prior to Admission medications   Not on File   BP 141/79  Pulse 100  Temp(Src) 98.6 F (37 C) (Oral)  Resp 18  Ht 5\' 10"  (1.778 m)  Wt 205 lb (92.987 kg)  BMI 29.41 kg/m2  SpO2 97% Physical Exam  Nursing note and vitals reviewed. Constitutional: He appears well-developed and well-nourished.  HENT:  Head: Normocephalic and atraumatic.  Eyes: Conjunctivae are normal. Pupils are equal, round, and reactive to light.  Neck: Neck supple. No tracheal deviation present. No thyromegaly present.  Cardiovascular: Normal rate and regular rhythm.   No murmur heard. Pulmonary/Chest: Effort normal and breath sounds normal.  Abdominal: Soft. Bowel sounds are normal. He exhibits no distension. There is no tenderness.  Genitourinary: Penis normal.  Anal fissure noted at 12:00   Musculoskeletal: Normal range of motion. He exhibits no edema and no tenderness.  Neurological: He is alert. Coordination normal.  Skin: Skin is warm and dry. No rash noted.  Psychiatric: He has a normal mood and affect.    ED Course  Procedures (including critical care time) Labs Review Labs Reviewed  CBC WITH DIFFERENTIAL - Abnormal; Notable for the following:    HCT 36.5 (*)    MCHC 36.2 (*)    Monocytes Absolute 1.1 (*)    All other components within normal limits  COMPREHENSIVE METABOLIC PANEL    Imaging Review No results found.   EKG Interpretation None     Results for orders placed during the hospital encounter of 11/30/13  CBC WITH DIFFERENTIAL      Result Value Ref Range   WBC 10.0  4.0 - 10.5 K/uL   RBC 4.40  4.22 - 5.81 MIL/uL   Hemoglobin 13.2  13.0 - 17.0 g/dL   HCT 82.936.5 (*) 56.239.0 - 13.052.0 %   MCV 83.0  78.0 - 100.0 fL   MCH 30.0  26.0 - 34.0 pg   MCHC 36.2 (*) 30.0 - 36.0 g/dL   RDW 86.511.6  78.411.5 - 69.615.5 %   Platelets 232  150 - 400 K/uL   Neutrophils Relative % 74  43 - 77 %   Neutro Abs 7.4  1.7 - 7.7 K/uL   Lymphocytes Relative 13  12 - 46 %   Lymphs Abs 1.3  0.7 - 4.0 K/uL   Monocytes Relative 12  3 - 12 %  Monocytes Absolute 1.1 (*) 0.1 - 1.0 K/uL   Eosinophils Relative 1  0 - 5 %   Eosinophils Absolute 0.1  0.0 - 0.7 K/uL   Basophils Relative 0  0 - 1 %   Basophils Absolute 0.0  0.0 - 0.1 K/uL  COMPREHENSIVE METABOLIC PANEL      Result Value Ref Range   Sodium 137  137 - 147 mEq/L   Potassium 3.5 (*) 3.7 - 5.3 mEq/L   Chloride 96  96 - 112 mEq/L   CO2 24  19 - 32 mEq/L   Glucose, Bld 131 (*) 70 - 99 mg/dL   BUN 8  6 - 23 mg/dL   Creatinine, Ser 1.910.83  0.50 - 1.35 mg/dL   Calcium 9.3  8.4 - 47.810.5 mg/dL   Total Protein 8.9 (*) 6.0 - 8.3 g/dL   Albumin 4.0  3.5 - 5.2 g/dL   AST 26  0 - 37 U/L   ALT 19  0 - 53 U/L   Alkaline Phosphatase 69  39 - 117 U/L   Total Bilirubin 0.4  0.3 - 1.2 mg/dL   GFR calc non Af Amer >90  >90 mL/min   GFR calc Af  Amer >90  >90 mL/min   Anion gap 17 (*) 5 - 15   No results found.  MDM  Plan sitz baths, prescriptions Colace, Norco. Gastroenterology referral if not better in a week.referral resource guide to get primary md  Final diagnoses:  None   diagnosis #1anal fissure #2 hypokalemia #3hypergycemia     Doug SouSam Brondon Wann, MD 11/30/13 2207

## 2013-11-30 NOTE — ED Notes (Signed)
Pt. reports rectal pain for several days  with intermittent bleeding  , denies injury , no fever or chills/ no constipation .

## 2013-11-30 NOTE — Discharge Instructions (Signed)
Anal Fissure, Adult Sit in warm bathtub 4 times daily for 30 minutes at a time to help with discomfort. Call [facility] office at [phone] to schedule an office visit if you're not better in a week. Your blood sugar was mildly elevated today at 131. You may be borderline diabetic. Call and he of the numbers on the resource guide to get a primary care physician. Ask your new physician to order a test called a hemoglobin A1c to check for diabetes An anal fissure is a small tear or crack in the skin around the anus. Bleeding from a fissure usually stops on its own within a few minutes. However, bleeding will often reoccur with each bowel movement until the crack heals.  CAUSES   Passing large, hard stools.  Frequent diarrheal stools.  Constipation.  Inflammatory bowel disease (Crohn's disease or ulcerative colitis).  Infections.  Anal sex. SYMPTOMS   Small amounts of blood seen on your stools, on toilet paper, or in the toilet after a bowel movement.  Rectal bleeding.  Painful bowel movements.  Itching or irritation around the anus. DIAGNOSIS Your caregiver will examine the anal area. An anal fissure can usually be seen with careful inspection. A rectal exam may be performed and a short tube (anoscope) may be used to examine the anal canal. TREATMENT   You may be instructed to take fiber supplements. These supplements can soften your stool to help make bowel movements easier.  Sitz baths may be recommended to help heal the tear. Do not use soap in the sitz baths.  A medicated cream or ointment may be prescribed to lessen discomfort. HOME CARE INSTRUCTIONS   Maintain a diet high in fruits, whole grains, and vegetables. Avoid constipating foods like bananas and dairy products.  Take sitz baths as directed by your caregiver.  Drink enough fluids to keep your urine clear or pale yellow.  Only take over-the-counter or prescription medicines for pain, discomfort, or fever  as directed by your caregiver. Do not take aspirin as this may increase bleeding.  Do not use ointments containing numbing medications (anesthetics) or hydrocortisone. They could slow healing. SEEK MEDICAL CARE IF:   Your fissure is not completely healed within 3 days.  You have further bleeding.  You have a fever.  You have diarrhea mixed with blood.  You have pain.  Your problem is getting worse rather than better. MAKE SURE YOU:   Understand these instructions.  Will watch your condition.  Will get help right away if you are not doing well or get worse. Document Released: 02/03/2005 Document Revised: 04/28/2011 Document Reviewed: 07/21/2010 [facility] Patient Information 2015 [place], [place]. This information is not intended to replace advice given to you by your health care provider. Make sure you discuss any questions you have with your health care provider.  Emergency Department Resource Guide 1) Find a Doctor and Pay Out of Pocket Although you won't have to find out who is covered by your insurance plan, it is a good idea to ask around and get recommendations. You will then need to call the office and see if the doctor you have chosen will accept you as a new patient and what types of options they offer for patients who are self-pay. Some doctors offer discounts or will set up payment plans for their patients who do not have insurance, but you will need to ask so you aren't surprised when you get to your appointment.  2) Contact Your Local Health Department Not all  health departments have doctors that can see patients for sick visits, but many do, so it is worth a call to see if yours does. If you don't know where your local health department is, you can check in your phone book. The CDC also has a tool to help you locate your state's health department, and many state websites also have listings of all of their local health departments.  3) Find a Walk-in Clinic If your illness  is not likely to be very severe or complicated, you may want to try a walk in clinic. These are popping up all over the country in pharmacies, drugstores, and shopping centers. They're usually staffed by nurse practitioners or physician assistants that have been trained to treat common illnesses and complaints. They're usually fairly quick and inexpensive. However, if you have serious medical issues or chronic medical problems, these are probably not your best option.  No Primary Care Doctor: - Call Health Connect at  [phone] - they can help you locate a primary care doctor that  accepts your insurance, provides certain services, etc. - Physician Referral Service- [phone]  Chronic Pain Problems: Organization         Address  Phone   Notes  [facility]  [phone] Patients need to be referred by their primary care doctor.   Medication Assistance: Organization         Address  Phone   Notes  [facility] Medication [facility] [address] [place], [place] [address] [phone]) [phone] --Must be a resident of [place] [place] -- Must have NO insurance coverage whatsoever (no Medicaid/ Medicare, etc.) -- The pt. MUST have a primary care doctor that directs their care regularly and follows them in the community   MedAssist  [phone]   [employer]  [phone]    Agencies that provide inexpensive medical care: Organization         Address  Phone   Notes  [facility]  [phone]   [facility]    [phone]) [phone]   [facility] [address] [place], [place] [address] [phone]) [phone]   [facility] [address], [place] [phone]   Planned Parenthood    [phone]) [phone]   [facility]    [phone]   [facility]  [address], [place] Phone:  [phone], Fax:  [phone]) [phone] Hours of Operation:  9 am - 6  pm, M-F.  Also accepts Medicaid/Medicare and self-pay.  [facility]  [address], [place] Phone: [phone], Fax: [phone]. Hours of Operation:  8:30 am - 5:30 pm, M-F.  Also accepts Medicaid and self-pay.  [facility] [place] [address], [place] Phone: [phone]) [phone]   [facility] [address], [place], [place] [phone], Ext. [phone] Mondays & Thursdays: 7-9 AM.  First 15 patients are seen on a first come, first serve basis.    Medicaid-accepting [facility] Providers:  Organization         Address  Phone   Notes  [facility] [address], [place] [phone] Also accepts self-pay patients.  [facility] [address], [place]  [phone]   [facility] [address], [place] [phone]) [phone]   [facility] [address], [place] [phone]   Renaye Rakers [address], [place]   [phone] Only accepts [place] patients after they have their name applied to their card.   Self-Pay (no insurance) in [facility]:  Organization         Address  Phone   Notes  Sickle Cell Patients, [facility] [address] [place], [place] [phone]   [facility] [address] [place], [place] [phone]   [facility] [place]  [address], [place] [phone]   [facility]/Dr. Osei-Bonsu  [address] [place] or [address], [place] [phone] Phone number for both [place] and [place] locations is the same.  [facility] [address] [place] [phone]   [facility] [address], [place] or [address] [phone]   [facility] [address] [place] [phone], phone; [phone], fax Sees patients 1st and 3rd Saturday of every month.  Must not qualify for public or private insurance (i.e. Medicaid, Medicare, [place] Health Choice, Veterans' Benefits)  Household income should be no more than 200% of the poverty level The clinic cannot treat you if you are pregnant or think you are pregnant  Sexually transmitted diseases are not treated at the clinic.    Dental Care: Organization         Address  Phone  Notes  [facility] [address] [place], [place] [phone] Accepts children up to age 58 who are enrolled in [place] or [place] Health Choice; pregnant women with a Medicaid card; and children who have applied for Medicaid or [place] Health Choice, but were declined, whose parents can pay a reduced fee at time of service.  [facility] Department of [facility]  [address], [place] [phone] Accepts children up to age 28 who are enrolled in [place] or [place] Health Choice; pregnant women with a Medicaid card; and children who have applied for Medicaid or [place] Health Choice, but were declined, whose parents can pay a reduced fee at time of service.  [facility]  [address] [place], [place] [phone] Patients are seen by appointment only. Walk-ins are not accepted. [facility] will see patients 38 years of age and older. Monday - Tuesday (8am-5pm) Most Wednesdays (8:30-5pm) $30 per visit, cash only  [facility] Adult Dental Access PROGRAM  [address], [facility] [phone] Patients are seen by appointment only. Walk-ins are not accepted. [facility] will see patients 68 years of age and older. One Wednesday Evening (Monthly: Volunteer Based).  $30 per visit, cash only  [employer]  [phone] for adults; Children under age 55, call Graduate Pediatric Dentistry at [phone]. Children aged 21-14, please call [phone] to request a pediatric application.  Dental services are provided in all areas of dental care including fillings, crowns and bridges, complete and partial dentures, implants, gum treatment, root canals, and extractions. Preventive care is also provided. Treatment is provided to both adults and children. Patients are selected via a lottery and there is often a waiting list.   [facility] [address] [place]  [phone] [email or web address]   [facility] [address], [place], [place] [phone], Ext. [phone] Second and Fourth Thursday of each month, opens at 6:30 AM; Clinic ends at 9 AM.  Patients are seen on a first-come first-served basis, and a limited number are seen during each clinic.   [facility]  [address], [place], [place] [phone]) [phone]   Eligibility Requirements You must have lived in [place], [place], or [place] counties for at least the last three months.   You cannot be eligible for state or federal sponsored National Cityhealthcare insurance, including CIGNAVeterans Administration, [place]icaid, or [employer]icare.   You generally cannot be eligible for healthcare insurance through your employer.    How to apply: Eligibility screenings are held every Tuesday and Wednesday afternoon from 1:00 pm until 4:00 pm. You do not need an appointment for the interview!  [facility] [address], [place], [place] [phone]   [facility]  [phone]   [facility]  [phone]   [facility]  [phone]    Behavioral Health Resources in the Community: Intensive Outpatient Programs Organization         Address  Phone  Notes  [facility] [address], [place], [place] [phone]   [facility] [address], [place], [place] [phone]   [facility]: [facility] Drug Sv[facility]  [address], [place], [place]  [phone]   [facility] [address],  [place], [place] [phone] or [phone]   Substance Abuse Resources Organization         Address  Phone  Notes  [facility]  [phone]   [facility]  [phone]   [facility]  [phone]   [facility]  [phone]   Residential & Outpatient Substance Abuse Program  [phone]   Psychological Services Organization         Address  Phone  Notes  [facility]  [phone]- [phone]   [facility]  [phone]- [phone]   [facility] [address], [place] [phone] or [phone]    Mobile Crisis Teams Organization         Address  Phone  Notes  [facility], Mobile Crisis Care Unit  [phone]   [facility]  [address] [place], [place] [phone]   Doristine LocksSharon DeEsch [address] [place] [phone]    Self-Help/Support Groups Organization         Address  Phone             Notes  [facility] [place] - variety of support groups  [phone] Call for more information  Narcotics Anonymous (NA), [facility] [address], [place]  2 meetings at this location   Statisticianesidential Treatment Programs Organization         Address  Phone  Notes  [facility] [address],    [place]  [phone]   [facility]  [address], [place] [phone], [place], [place] [phone]   [facility] [address], [place] [phone] Admissions: 8am-3pm M-F  [facility] [address]    [place], [place] [phone]   [facility] [address], [place], [place] [phone]   [facility] [address]  [place], [place] [phone]   [facility] - Intensive Outpatient [phone], [place], [place]  [phone]   [facility] (Addiction Recovery [facility] [address] [place].,  [place], [place] [phone] or [phone]   [facility] ([facility]) [address] [place], [place] [phone] Accepts Medicaid  [facility] [address]  [place] [phone] Substance Abuse/Addiction Treatment   [facility] Organization         Address  Phone  Notes  [facility]  [phone]   Angie Fava, PhD [address] [place], [place]   [phone] or [phone]   [facility]   [address] [place], [place] [phone]   [facility] [address], [place], [place] [phone] Insurance/Medicaid/sponsorship through [facility] and Families [address]                                    [place], [place] [phone] Therapy/tele-psych/case  [facility] [address], [place] [phone]    Dr. Lolly Mustache  [phone]   [facility] [place]  [employer] [facility] Dept. 1) [address], [place] 2) [address], [place] 3)  [address], [place] [phone]  [phone]   [facility] Child Abuse Hotline [phone] or [phone] (After Hours)

## 2015-01-08 ENCOUNTER — Emergency Department (HOSPITAL_COMMUNITY)
Admission: EM | Admit: 2015-01-08 | Discharge: 2015-01-08 | Disposition: A | Discharge status: Home / Self Care | Payer: Self-pay | Attending: Emergency Medicine | Admitting: Emergency Medicine

## 2015-01-08 ENCOUNTER — Encounter (HOSPITAL_COMMUNITY): Payer: Self-pay | Admitting: Emergency Medicine

## 2015-01-08 DIAGNOSIS — L03114 Cellulitis of left upper limb: Secondary | ICD-10-CM | POA: Insufficient documentation

## 2015-01-08 DIAGNOSIS — F17210 Nicotine dependence, cigarettes, uncomplicated: Secondary | ICD-10-CM | POA: Insufficient documentation

## 2015-01-08 MED ORDER — SULFAMETHOXAZOLE-TRIMETHOPRIM 800-160 MG PO TABS: 1.0000 | ORAL_TABLET | Freq: Two times a day (BID) | ORAL | Status: AC

## 2015-01-08 MED ORDER — ACIDOPHILUS PROBIOTIC 10 MG PO TABS: 10.0000 mg | ORAL_TABLET | Freq: Three times a day (TID) | ORAL | Status: DC

## 2015-01-08 MED ORDER — CEPHALEXIN 500 MG PO CAPS: 500.0000 mg | ORAL_CAPSULE | Freq: Four times a day (QID) | ORAL | Status: DC

## 2015-01-08 NOTE — ED Provider Notes (Signed)
CSN: [id number]     Arrival date & time 01/08/15  1135 History  By signing my name below, I, Murriel Hopper, attest that this documentation has been prepared under the direction and in the presence of Will Erice Ahles, PA-C Electronically Signed: Murriel Hopper, ED Scribe. 01/08/2015. 1:32 PM.    Chief Complaint  Patient presents with  . Recurrent Skin Infections      The history is provided by the patient. No language interpreter was used.   HPI Comments: Mark Thornton is a 44 y.o. male who presents to the Emergency Department complaining of constant, worsening recurrent skin infections that have been present for about a month. Pt complains today of a skin infection to his left cubital fossa that appeared a few days ago. Pt states he thinks it could be from shooting up cocaine a month or so ago, and states that the past two days he has had swelling and worsening pain. Pt also reports the area is tender to touch. Pt denies discharge from the area or a fever.   History reviewed. No pertinent past medical history. History reviewed. No pertinent past surgical history. No family history on file. Social History  Substance Use Topics  . Smoking status: Current Every Day Smoker    Types: Cigarettes  . Smokeless tobacco: None  . Alcohol Use: Yes     Comment: daily    Review of Systems  Constitutional: Negative for fever and chills.  Gastrointestinal: Negative for nausea, vomiting, abdominal pain and diarrhea.  Skin: Positive for color change and wound.  Neurological: Negative for weakness and numbness.      Allergies  Review of patient's allergies indicates no known allergies.  Home Medications   Prior to Admission medications   Medication Sig Start Date End Date Taking? Authorizing Provider  cephALEXin (KEFLEX) 500 MG capsule Take 1 capsule (500 mg total) by mouth 4 (four) times daily. 01/08/15   Everlene Farrier, PA-C  docusate sodium (COLACE) 100 MG capsule Take 1 capsule (100 mg  total) by mouth every 12 (twelve) hours. 11/30/13   Doug Sou, MD  HYDROcodone-acetaminophen (NORCO) 5-325 MG per tablet Take 1 tablet by mouth every 6 (six) hours as needed for severe pain. 11/30/13   Doug Sou, MD  Lactobacillus (ACIDOPHILUS PROBIOTIC) 10 MG TABS Take 10 mg by mouth 3 (three) times daily. 01/08/15   Everlene Farrier, PA-C  sulfamethoxazole-trimethoprim (BACTRIM DS,SEPTRA DS) 800-160 MG tablet Take 1 tablet by mouth 2 (two) times daily. 01/08/15 01/15/15  Everlene Farrier, PA-C   BP 149/86 mmHg  Pulse 85  Temp(Src) 98.3 F (36.8 C) (Oral)  Resp 16  Ht  (1.803 m)  Wt 92.987 kg  BMI 28.60 kg/m2  SpO2 98% Physical Exam  Constitutional: He appears well-developed and well-nourished. No distress.  Nontoxic appearing.  HENT:  Head: Normocephalic and atraumatic.  Eyes: Right eye exhibits no discharge. Left eye exhibits no discharge.  Cardiovascular: Normal rate, regular rhythm and intact distal pulses.   Bilateral radial pulses are intact.  Pulmonary/Chest: Effort normal. No respiratory distress.  Musculoskeletal:  Patient has a 5 cm area of induration to his left antecubital fossa. No overlying skin changes. Good range of motion of his elbow. No area of fluctuance or discharge.  Neurological: He is alert. Coordination normal.  Skin: Skin is warm and dry. No rash noted. He is not diaphoretic. No erythema. No pallor.  Psychiatric: He has a normal mood and affect. His behavior is normal.  Nursing note and vitals reviewed.  ED Course  Procedures (including critical care time)   DIAGNOSTIC STUDIES: Oxygen Saturation is 98% on room air, normal by my interpretation.    COORDINATION OF CARE: 1:10 PM Discussed treatment plan with pt at bedside and pt agreed to plan.  EMERGENCY DEPARTMENT US SOFT TISSUE INTERPRETATION "Study: Limited Ultrasound of the noted body part in comments below"  INDICATIONS: Soft tissue infection Multiple views of the body part are  obtained with a multi-frequency linear probe  PERFORMED BY:  Myself  IMAGES ARCHIVED?: Yes  SIDE:Left  BODY PART:Upper extremity  FINDINGS: Cellulitis present  LIMITATIONS:  Body Habitus  INTERPRETATION:  Cellulitis present  COMMENT:  Area of induration present to left AC. No large area of fluctuance on ultrasound. Just large area of cobblestoning.    Labs Review Labs Reviewed - No data to display  Imaging Review No results found. I have personally reviewed and evaluated these images as part of my medical decision-making.   EKG Interpretation None      Filed Vitals:   01/08/15 1231  BP: 149/86  Pulse: 85  Temp: 98.3 F (36.8 C)  TempSrc: Oral  Resp: 16  Height: 5\' 11"  (1.803 m)  Weight: 92.987 kg  SpO2: 98%     1:41 PM Ultrasound:     MDM   Meds given in ED:  Medications - No data to display  New Prescriptions   CEPHALEXIN (KEFLEX) 500 MG CAPSULE    Take 1 capsule (500 mg total) by mouth 4 (four) times daily.   LACTOBACILLUS (ACIDOPHILUS PROBIOTIC) 10 MG TABS    Take 10 mg by mouth 3 (three) times daily.   SULFAMETHOXAZOLE-TRIMETHOPRIM (BACTRIM DS,SEPTRA DS) 800-160 MG TABLET    Take 1 tablet by mouth 2 (two) times daily.    Final diagnoses:  Cellulitis of left upper extremity   This is a 44 year old male presenting to the emergency department complaining of pain to his left before meals after shooting up one month ago. On exam the patient is afebrile nontoxic appearing. Patient has a 5 cm area of induration to his left before meals. On ultrasound there is no area of abscess that is clearly demarcated. Mostly just area of cobblestoning. Will not perform an incision and drainage at this time. We'll place the patient on Keflex and Bactrim and have him follow-up closely with primary care back in the emergency department. I also gave the patient follow-up with orthopedic hand surgeon Dr. Melvyn Novasrtmann. I advised strict return precautions. I advised the patient to  follow-up with their primary care provider this week. I advised the patient to return to the emergency department with new or worsening symptoms or new concerns. The patient verbalized understanding and agreement with plan.    I personally performed the services described in this documentation, which was scribed in my presence. The recorded information has been reviewed and is accurate.     Everlene FarrierWilliam Jaycee Mckellips, PA-C 01/08/15 1342  Leta BaptistEmily Roe Nguyen, MD 01/08/15 2218

## 2015-01-08 NOTE — Discharge Instructions (Signed)
Cellulitis °Cellulitis is an infection of the skin and the tissue beneath it. The infected area is usually red and tender. Cellulitis occurs most often in the arms and lower legs.  °CAUSES  °Cellulitis is caused by bacteria that enter the skin through cracks or cuts in the skin. The most common types of bacteria that cause cellulitis are staphylococci and streptococci. °SIGNS AND SYMPTOMS  °· Redness and warmth. °· Swelling. °· Tenderness or pain. °· Fever. °DIAGNOSIS  °Your health care provider can usually determine what is wrong based on a physical exam. Blood tests may also be done. °TREATMENT  °Treatment usually involves taking an antibiotic medicine. °HOME CARE INSTRUCTIONS  °· Take your antibiotic medicine as directed by your health care provider. Finish the antibiotic even if you start to feel better. °· Keep the infected arm or leg elevated to reduce swelling. °· Apply a warm cloth to the affected area up to 4 times per day to relieve pain. °· Take medicines only as directed by your health care provider. °· Keep all follow-up visits as directed by your health care provider. °SEEK MEDICAL CARE IF:  °· You notice red streaks coming from the infected area. °· Your red area gets larger or turns dark in color. °· Your bone or joint underneath the infected area becomes painful after the skin has healed. °· Your infection returns in the same area or another area. °· You notice a swollen bump in the infected area. °· You develop new symptoms. °· You have a fever. °SEEK IMMEDIATE MEDICAL CARE IF:  °· You feel very sleepy. °· You develop vomiting or diarrhea. °· You have a general ill feeling (malaise) with muscle aches and pains. °  °This information is not intended to replace advice given to you by your health care provider. Make sure you discuss any questions you have with your health care provider. °  °Document Released: 11/13/2004 Document Revised: 10/25/2014 Document Reviewed: 04/21/2011 °Elsevier Interactive  Patient Education ©2016 [employer] ° °Abscess °An abscess is an infected area that contains a collection of pus and debris. It can occur in almost any part of the body. An abscess is also known as a furuncle or boil. °CAUSES  °An abscess occurs when tissue gets infected. This can occur from blockage of oil or sweat glands, infection of hair follicles, or a minor injury to the skin. As the body tries to fight the infection, pus collects in the area and creates pressure under the skin. This pressure causes pain. People with weakened immune systems have difficulty fighting infections and get certain abscesses more often.  °SYMPTOMS °Usually an abscess develops on the skin and becomes a painful mass that is red, warm, and tender. If the abscess forms under the skin, you may feel a moveable soft area under the skin. Some abscesses break open (rupture) on their own, but most will continue to get worse without care. The infection can spread deeper into the body and eventually into the bloodstream, causing you to feel ill.  °DIAGNOSIS  °Your caregiver will take your medical history and perform a physical exam. A sample of fluid may also be taken from the abscess to determine what is causing your infection. °TREATMENT  °Your caregiver may prescribe antibiotic medicines to fight the infection. However, taking antibiotics alone usually does not cure an abscess. Your caregiver may need to make a small cut (incision) in the abscess to drain the pus. In some cases, gauze is packed into the abscess to reduce   pain and to continue draining the area. °HOME CARE INSTRUCTIONS  °· Only take over-the-counter or prescription medicines for pain, discomfort, or fever as directed by your caregiver. °· If you were prescribed antibiotics, take them as directed. Finish them even if you start to feel better. °· If gauze is used, follow your caregiver's directions for changing the gauze. °· To avoid spreading the infection: °¨ Keep your  draining abscess covered with a bandage. °¨ Wash your hands well. °¨ Do not share personal care items, towels, or whirlpools with others. °¨ Avoid skin contact with others. °· Keep your skin and clothes clean around the abscess. °· Keep all follow-up appointments as directed by your caregiver. °SEEK MEDICAL CARE IF:  °· You have increased pain, swelling, redness, fluid drainage, or bleeding. °· You have muscle aches, chills, or a general ill feeling. °· You have a fever. °MAKE SURE YOU:  °· Understand these instructions. °· Will watch your condition. °· Will get help right away if you are not doing well or get worse. °  °This information is not intended to replace advice given to you by your health care provider. Make sure you discuss any questions you have with your health care provider. °  °Document Released: 11/13/2004 Document Revised: 08/05/2011 Document Reviewed: 04/18/2011 °Elsevier Interactive Patient Education ©2016 [employer] ° °

## 2015-01-08 NOTE — ED Notes (Signed)
Left arm infection after "shooting up" no fever or other complaints, A/O X4 and in NAD

## 2017-02-16 ENCOUNTER — Other Ambulatory Visit: Payer: Self-pay

## 2017-02-16 ENCOUNTER — Emergency Department (HOSPITAL_COMMUNITY)
Admission: EM | Admit: 2017-02-16 | Discharge: 2017-02-16 | Disposition: A | Discharge status: Home / Self Care | Payer: No Typology Code available for payment source

## 2017-02-16 NOTE — ED Notes (Signed)
No answer for triage x1 

## 2019-12-07 ENCOUNTER — Emergency Department (HOSPITAL_COMMUNITY)
Admission: EM | Admit: 2019-12-07 | Discharge: 2019-12-07 | Disposition: A | Discharge status: Home / Self Care | Payer: Self-pay | Attending: Emergency Medicine | Admitting: Emergency Medicine

## 2019-12-07 ENCOUNTER — Encounter (HOSPITAL_COMMUNITY): Payer: Self-pay | Admitting: Emergency Medicine

## 2019-12-07 ENCOUNTER — Emergency Department (HOSPITAL_COMMUNITY): Payer: Self-pay

## 2019-12-07 DIAGNOSIS — F17210 Nicotine dependence, cigarettes, uncomplicated: Secondary | ICD-10-CM | POA: Insufficient documentation

## 2019-12-07 DIAGNOSIS — W132XXA Fall from, out of or through roof, initial encounter: Secondary | ICD-10-CM | POA: Insufficient documentation

## 2019-12-07 DIAGNOSIS — S2241XA Multiple fractures of ribs, right side, initial encounter for closed fracture: Secondary | ICD-10-CM | POA: Insufficient documentation

## 2019-12-07 DIAGNOSIS — M546 Pain in thoracic spine: Secondary | ICD-10-CM | POA: Insufficient documentation

## 2019-12-07 MED ORDER — LIDOCAINE 5 % EX PTCH
1.0000 | MEDICATED_PATCH | CUTANEOUS | Status: DC
  Administered 2019-12-07: 1 via TRANSDERMAL
  Filled 2019-12-07: qty 1

## 2019-12-07 MED ORDER — OXYCODONE HCL 5 MG PO TABS: 5.0000 mg | ORAL_TABLET | ORAL | 0 refills | Status: DC | PRN

## 2019-12-07 MED ORDER — OXYCODONE HCL 5 MG PO TABS
5.0000 mg | ORAL_TABLET | Freq: Once | ORAL | Status: AC
Start: 2019-12-07 — End: 2019-12-07
  Administered 2019-12-07: 5 mg via ORAL
  Filled 2019-12-07: qty 1

## 2019-12-07 MED ORDER — CYCLOBENZAPRINE HCL 10 MG PO TABS
10.0000 mg | ORAL_TABLET | Freq: Once | ORAL | Status: AC
  Administered 2019-12-07: 10 mg via ORAL
  Filled 2019-12-07: qty 1

## 2019-12-07 MED ORDER — CYCLOBENZAPRINE HCL 10 MG PO TABS: 10.0000 mg | ORAL_TABLET | Freq: Two times a day (BID) | ORAL | 0 refills | Status: DC | PRN

## 2019-12-07 NOTE — ED Provider Notes (Signed)
[facility] EMERGENCY DEPARTMENT Provider Note   CSN: [id number] Arrival date & time: 12/07/19  1102     History Chief Complaint  Patient presents with  . Fall    Mark Thornton is a 49 y.o. male.  The history is provided by the patient.  Fall This is a new problem. The current episode started 2 days ago. The problem occurs constantly. Associated symptoms include chest pain. Pertinent negatives include no abdominal pain, no headaches and no shortness of breath. Associated symptoms comments: Right side front and back rib pain . Nothing aggravates the symptoms. Nothing relieves the symptoms. He has tried acetaminophen for the symptoms. The treatment provided no relief.       History reviewed. No pertinent past medical history.  There are no problems to display for this patient.   No past surgical history on file.     No family history on file.  Social History   Tobacco Use  . Smoking status: Current Every Day Smoker    Types: Cigarettes  Substance Use Topics  . Alcohol use: Yes    Comment: daily  . Drug use: Yes    Types: IV, Cocaine    Home Medications Prior to Admission medications   Medication Sig Start Date End Date Taking? Authorizing Provider  cephALEXin (KEFLEX) 500 MG capsule Take 1 capsule (500 mg total) by mouth 4 (four) times daily. 01/08/15   Everlene Farrier, PA-C  cyclobenzaprine (FLEXERIL) 10 MG tablet Take 1 tablet (10 mg total) by mouth 2 (two) times daily as needed for up to 20 doses for muscle spasms. 12/07/19   Ryne Mctigue, DO  docusate sodium (COLACE) 100 MG capsule Take 1 capsule (100 mg total) by mouth every 12 (twelve) hours. 11/30/13   Doug Sou, MD  HYDROcodone-acetaminophen (NORCO) 5-325 MG per tablet Take 1 tablet by mouth every 6 (six) hours as needed for severe pain. 11/30/13   Doug Sou, MD  Lactobacillus (ACIDOPHILUS PROBIOTIC) 10 MG TABS Take 10 mg by mouth 3 (three) times daily. 01/08/15    Everlene Farrier, PA-C  oxyCODONE (ROXICODONE) 5 MG immediate release tablet Take 1 tablet (5 mg total) by mouth every 4 (four) hours as needed for up to 20 doses for severe pain. 12/07/19   Virgina Norfolk, DO    Allergies    Patient has no known allergies.  Review of Systems   Review of Systems  Constitutional: Negative for chills and fever.  HENT: Negative for ear pain and sore throat.   Eyes: Negative for pain and visual disturbance.  Respiratory: Negative for cough and shortness of breath.   Cardiovascular: Positive for chest pain. Negative for palpitations.  Gastrointestinal: Negative for abdominal pain and vomiting.  Genitourinary: Negative for dysuria and hematuria.  Musculoskeletal: Positive for back pain. Negative for arthralgias.  Skin: Negative for color change and rash.  Neurological: Negative for seizures, syncope and headaches.  All other systems reviewed and are negative.   Physical Exam Updated Vital Signs BP (!) 183/103 (BP Location: Right Arm)   Pulse (!) 105   Temp 98.3 F (36.8 C) (Oral)   Resp 18   SpO2 95%   Physical Exam Vitals and nursing note reviewed.  Constitutional:      General: He is not in acute distress.    Appearance: He is well-developed. He is not ill-appearing.  HENT:     Head: Normocephalic and atraumatic.  Eyes:     Extraocular Movements: Extraocular movements intact.     Conjunctiva/sclera:  Conjunctivae normal.     Pupils: Pupils are equal, round, and reactive to light.  Cardiovascular:     Rate and Rhythm: Normal rate and regular rhythm.     Pulses: Normal pulses.     Heart sounds: No murmur heard.   Pulmonary:     Effort: No respiratory distress.     Comments: Overall mediocre respiratory effort secondary to pain with coarse breath sounds throughout Abdominal:     Palpations: Abdomen is soft.     Tenderness: There is no abdominal tenderness.  Musculoskeletal:        General: Tenderness present. Normal range of motion.      Cervical back: Normal range of motion and neck supple. No tenderness.     Comments: No specific midline spinal tenderness, tenderness to right anterior ribs and posterior right-sided ribs  Skin:    General: Skin is warm and dry.     Capillary Refill: Capillary refill takes less than 2 seconds.  Neurological:     General: No focal deficit present.     Mental Status: He is alert and oriented to person, place, and time.     Cranial Nerves: No cranial nerve deficit.     Sensory: No sensory deficit.     Motor: No weakness.     Coordination: Coordination normal.     ED Results / Procedures / Treatments   Labs (all labs ordered are listed, but only abnormal results are displayed) Labs Reviewed - No data to display  EKG None  Radiology DG Chest 2 View  Result Date: 12/07/2019 CLINICAL DATA:  Fall EXAM: CHEST - 2 VIEW COMPARISON:  2011 FINDINGS: Mild elevation of the left hemidiaphragm. No consolidation or edema. Normal heart size. Mildly displaced fractures of the posterior right third and fourth ribs. Possible posterolateral right first and second rib fractures. No pneumothorax. Right apical density. IMPRESSION: Mildly displaced fractures of the posterior right third and fourth ribs. Possible posterolateral right first and second rib fractures. No pneumothorax. Right apical density, which could reflect chronic pleural thickening or hematoma. Electronically Signed   By: Guadlupe Spanish M.D.   On: 12/07/2019 12:15   DG Thoracic Spine 2 View  Result Date: 12/07/2019 CLINICAL DATA:  Fall off of a roof. Landed on the right side of his back. EXAM: THORACIC SPINE 2 VIEWS COMPARISON:  CT chest 12/02/2009, chest radiograph 09/09/2008 FINDINGS: Mild height loss of a lower thoracic vertebral body on the lateral radiograph. Mild broad levocurvature of the thoracic spine. No substantial subluxation. No significant focal degenerative change. IMPRESSION: 1. Mild height loss of a lower thoracic vertebral  body on the lateral radiograph, which may relate to obliquity but acute compression fracture is not excluded in the setting of trauma. CT of the thoracic spine could further evaluate if clinically indicated. 2. No malalignment. Electronically Signed   By: Feliberto Harts MD   On: 12/07/2019 12:16    Procedures Procedures (including critical care time)  Medications Ordered in ED Medications  lidocaine (LIDODERM) 5 % 1 patch (1 patch Transdermal Patch Applied 12/07/19 1221)  oxyCODONE (Oxy IR/ROXICODONE) immediate release tablet 5 mg (5 mg Oral Given 12/07/19 1133)  cyclobenzaprine (FLEXERIL) tablet 10 mg (10 mg Oral Given 12/07/19 1133)    ED Course  I have reviewed the triage vital signs and the nursing notes.  Pertinent labs & imaging results that were available during my care of the patient were reviewed by me and considered in my medical decision making (see chart for details).  MDM Rules/Calculators/A&P                          Santez Woodcox is a 49 year old male with no significant medical history presents the ED with right-sided rib pain after a fall 2 days ago.  Patient fell from about 6 feet off a roof.  Landed on his right side.  Felt fine after the initial fall but the next morning he felt pain.  Pain when he takes a deep breath or moves.  Tylenol has not provided much relief.  He has coarse breath sounds throughout.  Tenderness to the right anterior and posterior ribs.  No shoulder tenderness.  No specific midline spinal tenderness.  Neurologically he is intact.  He is not on blood thinners.  Suspect rib fractures.  Will get x-rays.  Will give oxycodone, Flexeril.  Incentive spirometer given as well.  Patient likely with third and fourth posterior rib fractures as well as first and second posterior lateral rib fractures.  No pneumothorax.  Right apical density which could be chronic pleural thickening as he is a smoker but could be a mild hematoma.  However patient with  normal room air oxygenation.  Did well with incentive spirometer.  Felt better after some pain medications.  Will trial pain medications at home and understands return precautions including fever, worsening shortness of breath.  Thoracic spine x-ray showed may be mild height loss in the lower thoracic vertebral body but he does not have tenderness in this area.  Will treat with Roxicodone, Flexeril and encourage buying over-the-counter lidocaine patches.  Discharged in good condition.  This chart was dictated using voice recognition software.  Despite best efforts to proofread,  errors can occur which can change the documentation meaning.    Final Clinical Impression(s) / ED Diagnoses Final diagnoses:  Closed fracture of multiple ribs of right side, initial encounter    Rx / DC Orders ED Discharge Orders         Ordered    oxyCODONE (ROXICODONE) 5 MG immediate release tablet  Every 4 hours PRN        12/07/19 1234    cyclobenzaprine (FLEXERIL) 10 MG tablet  2 times daily PRN        12/07/19 1234           Virgina Norfolk, DO 12/07/19 1235

## 2019-12-07 NOTE — ED Notes (Signed)
IS given to pt , pt showed how to use it , returned demonstration

## 2019-12-07 NOTE — Discharge Instructions (Signed)
In addition to Roxicodone and Flexeril I recommend using 1000 mg of Tylenol 4 times a day as well as 600 mg of ibuprofen every 8 hours.  Consider buying over-the-counter lidocaine patches to place on the right side of ribs daily.  Please be careful with Roxicodone and Flexeril as they have sedating medications.  Do not use other alcohol or drugs or drive or do any dangerous work while taking these medications.  Please return if symptoms worsen including fever, shortness of breath

## 2019-12-07 NOTE — ED Triage Notes (Signed)
Pt arrives to ED ambulatory with c/o of a fall off a roof on Monday  10/18 approximately 6 ft. He landed on his right side of his back. He is having pain in right rib cage area and mid upper back. Pain worse with deep breath, he states since fall has been congested with cough.

## 2019-12-11 ENCOUNTER — Emergency Department (HOSPITAL_COMMUNITY)
Admission: EM | Admit: 2019-12-11 | Discharge: 2019-12-12 | Disposition: A | Discharge status: Home / Self Care | Payer: Self-pay | Attending: Emergency Medicine | Admitting: Emergency Medicine

## 2019-12-11 ENCOUNTER — Other Ambulatory Visit: Payer: Self-pay

## 2019-12-11 ENCOUNTER — Encounter (HOSPITAL_COMMUNITY): Payer: Self-pay | Admitting: Emergency Medicine

## 2019-12-11 DIAGNOSIS — R000 Tachycardia, unspecified: Secondary | ICD-10-CM | POA: Insufficient documentation

## 2019-12-11 DIAGNOSIS — Z20822 Contact with and (suspected) exposure to covid-19: Secondary | ICD-10-CM | POA: Insufficient documentation

## 2019-12-11 DIAGNOSIS — F39 Unspecified mood [affective] disorder: Secondary | ICD-10-CM

## 2019-12-11 DIAGNOSIS — F17210 Nicotine dependence, cigarettes, uncomplicated: Secondary | ICD-10-CM | POA: Insufficient documentation

## 2019-12-11 DIAGNOSIS — R4689 Other symptoms and signs involving appearance and behavior: Secondary | ICD-10-CM

## 2019-12-11 DIAGNOSIS — R451 Restlessness and agitation: Secondary | ICD-10-CM | POA: Insufficient documentation

## 2019-12-11 DIAGNOSIS — R456 Violent behavior: Secondary | ICD-10-CM | POA: Insufficient documentation

## 2019-12-11 DIAGNOSIS — F1010 Alcohol abuse, uncomplicated: Secondary | ICD-10-CM

## 2019-12-11 DIAGNOSIS — F1020 Alcohol dependence, uncomplicated: Secondary | ICD-10-CM | POA: Insufficient documentation

## 2019-12-11 DIAGNOSIS — Y900 Blood alcohol level of less than 20 mg/100 ml: Secondary | ICD-10-CM | POA: Insufficient documentation

## 2019-12-11 DIAGNOSIS — F6381 Intermittent explosive disorder: Secondary | ICD-10-CM

## 2019-12-11 LAB — RAPID URINE DRUG SCREEN, HOSP PERFORMED
Amphetamines: NOT DETECTED
Barbiturates: NOT DETECTED
Benzodiazepines: POSITIVE — AB
Cocaine: NOT DETECTED
Opiates: NOT DETECTED
Tetrahydrocannabinol: NOT DETECTED

## 2019-12-11 LAB — BASIC METABOLIC PANEL
Anion gap: 15 (ref 5–15)
BUN: 17 mg/dL (ref 6–20)
CO2: 26 mmol/L (ref 22–32)
Calcium: 10 mg/dL (ref 8.9–10.3)
Chloride: 91 mmol/L — ABNORMAL LOW (ref 98–111)
Creatinine, Ser: 0.89 mg/dL (ref 0.61–1.24)
GFR, Estimated: >60 mL/min (ref >60)
Glucose, Bld: 109 mg/dL — ABNORMAL HIGH (ref 70–99)
Potassium: 3.4 mmol/L — ABNORMAL LOW (ref 3.5–5.1)
Sodium: 132 mmol/L — ABNORMAL LOW (ref 135–145)

## 2019-12-11 LAB — CBC WITH DIFFERENTIAL/PLATELET
Abs Immature Granulocytes: 0.03 10*3/uL (ref 0.00–0.07)
Basophils Absolute: 0.1 10*3/uL (ref 0.0–0.1)
Basophils Relative: 1 %
Eosinophils Absolute: 0.2 10*3/uL (ref 0.0–0.5)
Eosinophils Relative: 2 %
HCT: 32.6 % — ABNORMAL LOW (ref 39.0–52.0)
Hemoglobin: 11.6 g/dL — ABNORMAL LOW (ref 13.0–17.0)
Immature Granulocytes: 0 %
Lymphocytes Relative: 18 %
Lymphs Abs: 1.4 10*3/uL (ref 0.7–4.0)
MCH: 30.9 pg (ref 26.0–34.0)
MCHC: 35.6 g/dL (ref 30.0–36.0)
MCV: 86.7 fL (ref 80.0–100.0)
Monocytes Absolute: 1.3 10*3/uL — ABNORMAL HIGH (ref 0.1–1.0)
Monocytes Relative: 17 %
Neutro Abs: 4.8 10*3/uL (ref 1.7–7.7)
Neutrophils Relative %: 62 %
Platelets: 180 10*3/uL (ref 150–400)
RBC: 3.76 MIL/uL — ABNORMAL LOW (ref 4.22–5.81)
RDW: 11.8 % (ref 11.5–15.5)
WBC: 7.8 10*3/uL (ref 4.0–10.5)
nRBC: 0 % (ref 0.0–0.2)

## 2019-12-11 LAB — RESPIRATORY PANEL BY RT PCR (FLU A&B, COVID)
Influenza A by PCR: NEGATIVE
Influenza B by PCR: NEGATIVE
SARS Coronavirus 2 by RT PCR: NEGATIVE

## 2019-12-11 LAB — ETHANOL: Alcohol, Ethyl (B): <10 mg/dL (ref <10)

## 2019-12-11 MED ORDER — POTASSIUM CHLORIDE CRYS ER 20 MEQ PO TBCR
40.0000 meq | EXTENDED_RELEASE_TABLET | Freq: Once | ORAL | Status: AC
  Administered 2019-12-11: 40 meq via ORAL
  Filled 2019-12-11: qty 2

## 2019-12-11 MED ORDER — OLANZAPINE 10 MG PO TABS
10.0000 mg | ORAL_TABLET | Freq: Two times a day (BID) | ORAL | Status: DC
  Administered 2019-12-11 – 2019-12-12 (×3): 10 mg via ORAL
  Filled 2019-12-11 (×3): qty 1

## 2019-12-11 NOTE — ED Notes (Signed)
Assumed care of pt at this time. Pt sleeping in chair, breathing even and unlabored. NAD. Wakes easily to verbal stimuli. Given breakfast tray at this time.
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
## 2019-12-11 NOTE — ED Provider Notes (Signed)
[facility]-EMERGENCY DEPT Provider Note   CSN: [id number] Arrival date & time: 12/11/19  1594     History Chief Complaint  Patient presents with  . Medical Clearance    Mark Thornton is a 49 y.o. male.  HPI     This is a 49 year old male who presents for medical clearance.  He was IVC by his mother.  I have reviewed IVC paperwork.  IVC paperwork indicates that he has been increasingly agitated.  Reports that he is seeing snakes and lizards in the house (which are not there) and attempting to kill them.  Also reports substance abuse.  Patient on my evaluation is cooperative.  He denies suicidal or homicidal ideation.  He reports that he has called for snakes and 2 lizards in the house.  He does not believe he is seeing things that are not there or hearing things that are not there.  No known history of psychiatric diagnoses.  Denies any hospitalizations for the same.  He does report alcohol use.  He reports that he will drink a 12 pack every couple of days.  Physically he reports that he is thirsty but otherwise reports some left shoulder pain from a recent fall.  He was evaluated for that followed an outside hospital.  History reviewed. No pertinent past medical history.  There are no problems to display for this patient.   History reviewed. No pertinent surgical history.     No family history on file.  Social History   Tobacco Use  . Smoking status: Current Every Day Smoker    Types: Cigarettes  Substance Use Topics  . Alcohol use: Yes    Comment: daily  . Drug use: Yes    Types: IV, Cocaine    Home Medications Prior to Admission medications   Medication Sig Start Date End Date Taking? Authorizing Provider  cyclobenzaprine (FLEXERIL) 10 MG tablet Take 1 tablet (10 mg total) by mouth 2 (two) times daily as needed for up to 20 doses for muscle spasms. 12/07/19  Yes Curatolo, Adam, DO  docusate sodium (COLACE) 100 MG capsule Take 1 capsule (100  mg total) by mouth every 12 (twelve) hours. 11/30/13  Yes Doug Sou, MD  oxyCODONE (ROXICODONE) 5 MG immediate release tablet Take 1 tablet (5 mg total) by mouth every 4 (four) hours as needed for up to 20 doses for severe pain. 12/07/19  Yes Curatolo, Adam, DO  cephALEXin (KEFLEX) 500 MG capsule Take 1 capsule (500 mg total) by mouth 4 (four) times daily. Patient not taking: Reported on 12/11/2019 01/08/15   Everlene Farrier, PA-C  HYDROcodone-acetaminophen [facility]) 5-325 MG per tablet Take 1 tablet by mouth every 6 (six) hours as needed for severe pain. Patient not taking: Reported on 12/11/2019 11/30/13   Doug Sou, MD  Lactobacillus (ACIDOPHILUS PROBIOTIC) 10 MG TABS Take 10 mg by mouth 3 (three) times daily. Patient not taking: Reported on 12/11/2019 01/08/15   Everlene Farrier, PA-C    Allergies    Patient has no known allergies.  Review of Systems   Review of Systems  Constitutional: Negative for fever.  Respiratory: Negative for shortness of breath.   Cardiovascular: Negative for chest pain.  Gastrointestinal: Negative for abdominal pain, nausea and vomiting.  Musculoskeletal:       Shoulder pain  All other systems reviewed and are negative.   Physical Exam Updated Vital Signs BP (!) 133/114 (BP Location: Right Arm)   Pulse (!) 110   Temp 98.9 F (37.2 C) (  Oral)   Resp 18   Ht 1.791 m (5' 10.5")   Wt 81.6 kg   SpO2 95%   BMI 25.46 kg/m   Physical Exam Vitals and nursing note reviewed.  Constitutional:      Appearance: He is well-developed. He is not ill-appearing.  HENT:     Head: Normocephalic and atraumatic.     Nose: Nose normal.     Mouth/Throat:     Mouth: Mucous membranes are moist.  Eyes:     Pupils: Pupils are equal, round, and reactive to light.  Cardiovascular:     Rate and Rhythm: Regular rhythm. Tachycardia present.  Pulmonary:     Effort: Pulmonary effort is normal. No respiratory distress.  Abdominal:     General: Bowel sounds are  normal.     Palpations: Abdomen is soft.  Musculoskeletal:     Cervical back: Neck supple.     Right lower leg: No edema.     Left lower leg: No edema.  Lymphadenopathy:     Cervical: No cervical adenopathy.  Skin:    General: Skin is warm and dry.  Neurological:     Mental Status: He is alert and oriented to person, place, and time.  Psychiatric:     Comments: Cooperative     ED Results / Procedures / Treatments   Labs (all labs ordered are listed, but only abnormal results are displayed) Labs Reviewed  CBC WITH DIFFERENTIAL/PLATELET - Abnormal; Notable for the following components:      Result Value   RBC 3.76 (*)    Hemoglobin 11.6 (*)    HCT 32.6 (*)    Monocytes Absolute 1.3 (*)    All other components within normal limits  BASIC METABOLIC PANEL - Abnormal; Notable for the following components:   Sodium 132 (*)    Potassium 3.4 (*)    Chloride 91 (*)    Glucose, Bld 109 (*)    All other components within normal limits  RAPID URINE DRUG SCREEN, HOSP PERFORMED - Abnormal; Notable for the following components:   Benzodiazepines POSITIVE (*)    All other components within normal limits  ETHANOL    EKG None  Radiology No results found.  Procedures Procedures (including critical care time)  Medications Ordered in ED Medications - No data to display  ED Course  I have reviewed the triage vital signs and the nursing notes.  Pertinent labs & imaging results that were available during my care of the patient were reviewed by me and considered in my medical decision making (see chart for details).    MDM Rules/Calculators/A&P                           Patient presents after being IVC by his mother.  He is cooperative on my evaluation and without significant complaint.  He does report some shoulder pain.  Labs reviewed.  Patient has mild hyponatremia and hypokalemia.  This will be replaced.  Otherwise his lab work is reassuring.  EtOH negative.  UDS positive for  benzodiazepines.  Patient medically cleared for TTS evaluation.  Final Clinical Impression(s) / ED Diagnoses Final diagnoses:  Aggressive behavior    Rx / DC Orders ED Discharge Orders    None       Angelus Hoopes, Mayer Masker, MD 12/11/19 [phone]

## 2019-12-11 NOTE — ED Triage Notes (Addendum)
Pt BIB [facility]. Pt being IVC'd by mother due to reports of risk of self harm, aggressive behavior, and substance abuse.

## 2019-12-11 NOTE — ED Notes (Signed)
Pt calm and cooperative in triage at this time. Pt denies SI/HI.

## 2019-12-11 NOTE — BH Assessment (Signed)
Phone call from mother who petitioned for IVC. Mother reports pt can be difficult when drinking alcohol, "but never in my life have I seen him like that. He was very different from himself." Mother is very concerned and would like updates on his disposition.

## 2019-12-11 NOTE — ED Notes (Signed)
Pt given some food and juice at this time.

## 2019-12-11 NOTE — ED Notes (Signed)
Pt changed into wine colored scrubs, wanded by security and his belongings were placed in one white belongings' bag placed in the triage area.

## 2019-12-11 NOTE — Progress Notes (Addendum)
Patient seen via telepsych by this provider; chart reviewed and consulted with Mark Thornton on 12/11/19.  On evaluation Mark Thornton reports, "I did see snakes in my mother's house, I cut the head off and showed them to my mother.  [name]. My mother is cool and all but she is the one with the problem"   He was IVC by his mother for visual hallucinations and agitation.  He initially denies use of alcohol but later endorses drinking "12 pack of Bud Ice" on Thursday October 21st.  He denies use of other illicit substances.  With the exception of the benzodiazepines that were given to him at the hospital, his UDS was negative; BAL was negative as well. It is possible that other illicit substances could have been consumed that are not detected on UDS. He gives permission to contact his sister Mark Thornton at  [phone]; and his grandmother, Mark Thornton [phone] for collateral information.  Unfortunately, this writer could not reach either to validate patient information.   During evaluation Mark Thornton is seated in an upright poistion on the gurney He is alert/oriented x 4; dysphoric mood but cooperative; and mood congruent with affect.  Patient is speaking in a clear tone at moderate volume, and normal pace; with good eye contact. His thought process is coherent but of rumination; There is no indication that he is currently responding to internal/external stimuli but he is guarded and delusional.  Patient denies suicidal/self-harm/homicidal ideation, psychosis, and paranoia.  Patient has remained calm throughout assessment and has answered questions appropriately.    Disposition: Patient does not meet criteria for psychiatric inpatient admission. He would benefit from 24 hours observation that would allow [place] to start antipsychotic medication and continue to evaluate for medication side effects and mood stabilization.  Will order EKG for  baseline before starting zyprexa; BMI 25. This information is discussed with the patient who agrees to the plan.    Start zyprexa 10mg  BID for psychosis  , PMHNP Patient seen via tele health for psychiatric evaluation, chart reviewed and case discussed with the physician extender and developed treatment plan. Reviewed the information documented and agree with the treatment plan. Mark Shoulder, MD

## 2019-12-12 ENCOUNTER — Encounter (HOSPITAL_COMMUNITY): Payer: Self-pay | Admitting: Registered Nurse

## 2019-12-12 MED ORDER — OXYCODONE HCL 5 MG PO TABS
5.0000 mg | ORAL_TABLET | Freq: Four times a day (QID) | ORAL | Status: DC | PRN
  Administered 2019-12-12 (×2): 5 mg via ORAL
  Filled 2019-12-12 (×2): qty 1

## 2019-12-12 MED ORDER — OLANZAPINE 10 MG PO TABS: 10.0000 mg | ORAL_TABLET | Freq: Two times a day (BID) | ORAL | 1 refills | Status: DC

## 2019-12-12 NOTE — BH Assessment (Addendum)
[facility] Assessment Progress Note  Per Shuvon Rankin, NP, this pt does not require psychiatric hospitalization at this time.  Pt presents under IVC initiated by pt's mother on 10/24, however, a First Examination was not completed within 24 hours of pt's arrival at [facility] and it has expired.  Pt is psychiatrically cleared.  Discharge instructions advise pt to follow up with [facility].  EDP Alvester Chou, MD and pt's nurse, Waynetta Sandy, have been notified.  Doylene Canning, MA Triage Specialist [phone]

## 2019-12-12 NOTE — ED Notes (Signed)
Pt reports had a fall last Monday injuried  Right shoulder at that time and it has been causing him problems since then. Pt is currently requesting something for pain.

## 2019-12-12 NOTE — Discharge Instructions (Addendum)
Our psychiatry team saw you and recommended that you take Zyprexa 10 mg twice per day (once in the morning and once at night).  You will need follow up with a mental health specialist at the number below.  Please call to make an appointment.  For your behavioral health needs, you are advised to follow up with [facility]:       [facility]      [address]      [place], [place] [address]      [phone]      They offer psychiatry/medication management and therapy.  New patients are being seen in their walk-in clinic.  Walk-in hours are Monday - Thursday from 8:00 am - 11:00 am for psychiatry, and Friday from 1:00 pm - 4:00 pm for therapy.  Walk-in patients are seen on a first come, first served basis, so try to arrive as early as possible for the best chance of being seen the same day.

## 2019-12-12 NOTE — [facility] Suicide Risk Assessment (Cosign Needed)
Suicide Risk Assessment  Discharge Assessment   [facility] Discharge Suicide Risk Assessment   Principal Problem: Intermittent explosive disorder Discharge Diagnoses: Principal Problem:   Intermittent explosive disorder Active Problems:   Psychosis, affective (HCC)   Alcohol abuse   Total Time spent with patient: 30 minutes  Musculoskeletal: Strength & Muscle Tone: within normal limits Gait & Station: normal Patient leans: N/A  Psychiatric Specialty Exam: Review of Systems  Psychiatric/Behavioral: Negative for memory loss. Depression: Denies. Hallucinations: Denies at this time. Substance abuse: Denies. Suicidal ideas: Denies. The patient does not have insomnia. Nervous/anxious: Stable.   All other systems reviewed and are negative.    Blood pressure (!) 142/92, pulse 83, temperature (!) 97.4 F (36.3 C), temperature source Oral, resp. rate 18, height 5' 10.5" (1.791 m), weight 81.6 kg, SpO2 95 %.Body mass index is 25.46 kg/m.  General Appearance: Casual  Eye Contact::  Good  Speech:  Clear and Coherent and Normal Rate409  Volume:  Normal  Mood:  "Fine"  Affect:  Appropriate and Congruent  Thought Process:  Coherent, Goal Directed and Descriptions of Associations: Intact  Orientation:  Full (Time, Place, and Person)  Thought Content:  WDL  Suicidal Thoughts:  No  Homicidal Thoughts:  No  Memory:  Immediate;   Good Recent;   Good  Judgement:  Intact  Insight:  Present  Psychomotor Activity:  Normal  Concentration:  Good  Recall:  Good  Fund of Knowledge:Good  Language: Good  Akathisia:  No  Handed:  Right  AIMS (if indicated):     Assets:  Communication Skills Desire for Improvement Housing Social Support  Sleep:     Cognition: WNL  ADL's:  Intact   Mental Status Per Nursing Assessment::   On Admission:    Mark Thornton, 49 y.o., male patient seen via tele psych by this provider, Dr. Lucianne Muss; and chart reviewed on 12/12/19.  On evaluation Mark Thornton  reports He is feeling fine this morning. Reporting he was restarted on medication last night and tolerated it well with no adverse reaction.  Patient reports that he slept well and ate breakfast without difficulty.  Patient wanting to know when he would be able to be discharged.  Discussed setting him up with outpatient psychiatric services for medication management, understanding voiced and agreed.     During evaluation Mark Thornton is alert/oriented x 4; calm/cooperative; and mood is congruent with affect.  He does not appear to be responding to internal/external stimuli or delusional thoughts.  Patient denies suicidal/self-harm/homicidal ideation, psychosis, and paranoia.  Patient answered question appropriately.   Message sent to social work Mark Thornton) to set up outpatient services for medication management and also spoke to the EDP that patient would need a prescription for the Zyprexa.    Demographic Factors:  Male  Loss Factors: None  Historical Factors: Impulsivity  Risk Reduction Factors:   Religious beliefs about death, Living with another person, especially a relative and Positive social support  Continued Clinical Symptoms:  Previous Psychiatric Diagnoses and Treatments  Cognitive Features That Contribute To Risk:  None    Suicide Risk:  Minimal: No identifiable suicidal ideation.  Patients presenting with no risk factors but with morbid ruminations; may be classified as minimal risk based on the severity of the depressive symptoms  Plan Of Care/Follow-up recommendations:  Activity:  As tolerated Diet:  Heart healthy   Disposition:  Psychiatrically cleared No evidence of imminent risk to self or others at present.   Patient does not meet criteria  for psychiatric inpatient admission. Supportive therapy provided about ongoing stressors. Discussed crisis plan, support from social network, calling 911, coming to the Emergency Department, and calling Suicide  Hotline.  Mark Ragone, NP 12/12/2019, 8:45 AM

## 2019-12-12 NOTE — ED Provider Notes (Signed)
Emergency Medicine Observation Re-evaluation Note  Mark Thornton is a 49 y.o. male, seen on rounds today.  Pt initially presented to the ED for complaints of IVC Currently, the patient is resting comfortably.  Patient had psych evaluation yesterday and per psychiatrist does not meet inpatient psychiatric criteria.  Plan for re-evaluation today.  Per Dr Gloris Manchester recommendation yesterday:  "Patient does not meet criteria for psychiatric inpatient admission. He would benefit from 24 hours observation that would allow [place] to start antipsychotic medication and continue to evaluate for medication side effects and mood stabilization.  Will order EKG for baseline before starting zyprexa; BMI 25. This information is discussed with the patient who agrees to the plan.    Start zyprexa 10mg  BID for psychosis."  Physical Exam  BP (!) 142/92 (BP Location: Right Arm)    Pulse 83    Temp (!) 97.4 F (36.3 C) (Oral)    Resp 18    Ht 5' 10.5" (1.791 m)    Wt 81.6 kg    SpO2 95%    BMI 25.46 kg/m  Physical Exam General: NAD Cardiac: RRR Lungs: No respiratory distress Psych: Stable  ED Course / MDM  EKG:EKG Interpretation  Date/Time:  Sunday December 11 2019 11:27:08 EDT Ventricular Rate:  89 PR Interval:    QRS Duration: 110 QT Interval:  398 QTC Calculation: 485 R Axis:   28 Text Interpretation: Sinus rhythm Probable left atrial enlargement Minimal ST elevation, anterior leads Borderline prolonged QT interval 12 Lead; Mason-Likar since last tracing no significant change Confirmed by 02-21-2002 [phone]) on 12/11/2019 1:22:53 PM    I have reviewed the labs performed to date as well as medications administered while in observation.    Plan  Current plan is for re-evaluation today. Patient is under full IVC at this time.   12/13/2019, MD 12/12/19 [phone]

## 2019-12-12 NOTE — ED Notes (Signed)
Pt DCd off unit to home per provider. Pt alert, calm, cooperative, no s/s of distress. DC information given to and reviewed with pt, pt acknowledged understanding. Belongings given to pt, pt ambulatory off unit, escorted by NT. Pt transported by family.

## 2019-12-12 NOTE — ED Provider Notes (Signed)
Patient re-evaluated by [facility] psych team, per note today:  "Disposition:  Psychiatrically cleared No evidence of imminent risk to self or others at present.   Patient does not meet criteria for psychiatric inpatient admission. Supportive therapy provided about ongoing stressors. Discussed crisis plan, support from social network, calling 911, coming to the Emergency Department, and calling Suicide Hotline"  Medically stable for discharge.   Terald Sleeper, MD 12/12/19 [phone]

## 2019-12-19 ENCOUNTER — Emergency Department (HOSPITAL_COMMUNITY)
Admission: EM | Admit: 2019-12-19 | Discharge: 2019-12-20 | Disposition: A | Discharge status: Home / Self Care | Payer: Self-pay | Attending: Emergency Medicine | Admitting: Emergency Medicine

## 2019-12-19 DIAGNOSIS — S2242XA Multiple fractures of ribs, left side, initial encounter for closed fracture: Secondary | ICD-10-CM | POA: Insufficient documentation

## 2019-12-19 DIAGNOSIS — F17210 Nicotine dependence, cigarettes, uncomplicated: Secondary | ICD-10-CM | POA: Insufficient documentation

## 2019-12-19 DIAGNOSIS — W19XXXD Unspecified fall, subsequent encounter: Secondary | ICD-10-CM

## 2019-12-19 DIAGNOSIS — S2241XA Multiple fractures of ribs, right side, initial encounter for closed fracture: Secondary | ICD-10-CM | POA: Insufficient documentation

## 2019-12-19 DIAGNOSIS — R079 Chest pain, unspecified: Secondary | ICD-10-CM | POA: Insufficient documentation

## 2019-12-19 DIAGNOSIS — W1839XA Other fall on same level, initial encounter: Secondary | ICD-10-CM | POA: Insufficient documentation

## 2019-12-19 DIAGNOSIS — S2243XD Multiple fractures of ribs, bilateral, subsequent encounter for fracture with routine healing: Secondary | ICD-10-CM

## 2019-12-19 NOTE — ED Triage Notes (Signed)
Pt BIB [facility] for ongoing Right back and shoulder pain from a fall 10/18. Seen multiple times for the same.  VSS  BP 161/115 HR 88 RR 18 97% RA

## 2019-12-20 ENCOUNTER — Emergency Department (HOSPITAL_COMMUNITY): Payer: Self-pay

## 2019-12-20 MED ORDER — KETOROLAC TROMETHAMINE 15 MG/ML IJ SOLN
15.0000 mg | Freq: Once | INTRAMUSCULAR | Status: AC
  Administered 2019-12-20: 15 mg via INTRAMUSCULAR
  Filled 2019-12-20: qty 1

## 2019-12-20 MED ORDER — ACETAMINOPHEN 500 MG PO TABS
1000.0000 mg | ORAL_TABLET | Freq: Once | ORAL | Status: AC
  Administered 2019-12-20: 1000 mg via ORAL
  Filled 2019-12-20: qty 2

## 2019-12-20 NOTE — Discharge Instructions (Addendum)
You can take 600 mg of ibuprofen every 6 hours, you can take 1000 mg of Tylenol every 6 hours, you can alternate these every 3 or you can take them together.  Continue to do incentive spirometry, follow up with your primary care provider for long term management of the pain related to your injuries

## 2019-12-20 NOTE — ED Provider Notes (Addendum)
[facility] EMERGENCY DEPARTMENT Provider Note   CSN: [id number] Arrival date & time: 12/19/19  1729     History Chief Complaint  Patient presents with  . Fall    Mark Thornton is a 49 y.o. male.   Fall Chronicity: 12/07/19. The current episode started more than 1 week ago. The problem occurs constantly. The problem has been gradually improving. Associated symptoms include chest pain. Pertinent negatives include no headaches and no shortness of breath. Exacerbated by: cough. Nothing relieves the symptoms. Treatments tried: NSAID. The treatment provided mild relief.       No past medical history on file.  Patient Active Problem List   Diagnosis Date Noted  . Intermittent explosive disorder 12/11/2019  . Psychosis, affective (HCC) 12/11/2019  . Alcohol abuse 12/11/2019    No past surgical history on file.     No family history on file.  Social History   Tobacco Use  . Smoking status: Current Every Day Smoker    Types: Cigarettes  Substance Use Topics  . Alcohol use: Yes    Comment: daily  . Drug use: Yes    Types: IV, Cocaine    Home Medications Prior to Admission medications   Medication Sig Start Date End Date Taking? Authorizing Provider  cyclobenzaprine (FLEXERIL) 10 MG tablet Take 1 tablet (10 mg total) by mouth 2 (two) times daily as needed for up to 20 doses for muscle spasms. 12/07/19   Curatolo, Adam, DO  OLANZapine (ZYPREXA) 10 MG tablet Take 1 tablet (10 mg total) by mouth in the morning and at bedtime. 12/12/19 01/11/20  Terald Sleeper, MD  oxyCODONE (ROXICODONE) 5 MG immediate release tablet Take 1 tablet (5 mg total) by mouth every 4 (four) hours as needed for up to 20 doses for severe pain. 12/07/19   Virgina Norfolk, DO    Allergies    Patient has no known allergies.  Review of Systems   Review of Systems  Constitutional: Negative for chills and fever.  HENT: Negative for congestion and rhinorrhea.   Respiratory:  Negative for cough and shortness of breath.   Cardiovascular: Positive for chest pain. Negative for palpitations.  Gastrointestinal: Negative for diarrhea, nausea and vomiting.  Genitourinary: Negative for difficulty urinating and dysuria.  Musculoskeletal: Positive for back pain. Negative for arthralgias.  Skin: Negative for color change and rash.  Neurological: Negative for light-headedness and headaches.    Physical Exam Updated Vital Signs BP (!) 169/99   Pulse 84   Temp 98.4 F (36.9 C) (Oral)   Resp 18   SpO2 99%   Physical Exam Vitals and nursing note reviewed.  Constitutional:      General: He is not in acute distress.    Appearance: Normal appearance.  HENT:     Head: Normocephalic and atraumatic.     Nose: No rhinorrhea.  Eyes:     General:        Right eye: No discharge.        Left eye: No discharge.     Conjunctiva/sclera: Conjunctivae normal.  Cardiovascular:     Rate and Rhythm: Normal rate and regular rhythm.  Pulmonary:     Effort: Pulmonary effort is normal. No respiratory distress.     Breath sounds: No stridor. No wheezing or rhonchi.  Chest:     Chest wall: Tenderness present.  Abdominal:     General: Abdomen is flat. There is no distension.     Palpations: Abdomen is soft.  Musculoskeletal:  General: No deformity or signs of injury.     Cervical back: Normal range of motion. No rigidity or tenderness.     Comments: Mid to upper thoracic tenderness to palpation, no crepitus no deformity.   Skin:    General: Skin is warm and dry.  Neurological:     General: No focal deficit present.     Mental Status: He is alert. Mental status is at baseline.     Motor: No weakness.  Psychiatric:        Mood and Affect: Mood normal.        Behavior: Behavior normal.        Thought Content: Thought content normal.     ED Results / Procedures / Treatments   Labs (all labs ordered are listed, but only abnormal results are displayed) Labs Reviewed -  No data to display  EKG EKG Interpretation  Date/Time:  Tuesday December 20 2019 05:22:50 EDT Ventricular Rate:  77 PR Interval:  174 QRS Duration: 92 QT Interval:  418 QTC Calculation: 473 R Axis:   70 Text Interpretation: Normal sinus rhythm Cannot rule out Anterior infarct , age undetermined Abnormal ECG Confirmed by Cherlynn Perches ([id number]) on 12/20/2019 5:29:12 AM   Radiology DG Chest 2 View  Result Date: 12/20/2019 CLINICAL DATA:  History of rib fracture with persistent pain EXAM: CHEST - 2 VIEW COMPARISON:  12/07/2019 FINDINGS: Right first through fifth rib fractures with mild displacement at the third through fifth fractures. There is pleural based thickening again suggesting extrapleural hemorrhage in this setting. No visible pneumothorax. Mild atelectasis at the lung bases. Normal heart size and aortic contours. IMPRESSION: 1. Right first through fifth rib fractures with displacement and presumed extrapleural hemorrhage. 2. Mild atelectasis. 3. No visible pneumothorax. Electronically Signed   By: Marnee Spring M.D.   On: 12/20/2019 04:57    Procedures Procedures (including critical care time)  Medications Ordered in ED Medications  ketorolac (TORADOL) 15 MG/ML injection 15 mg (15 mg Intramuscular Given 12/20/19 0428)  acetaminophen (TYLENOL) tablet 1,000 mg (1,000 mg Oral Given 12/20/19 2376)    ED Course  I have reviewed the triage vital signs and the nursing notes.  Pertinent labs & imaging results that were available during my care of the patient were reviewed by me and considered in my medical decision making (see chart for details).    MDM Rules/Calculators/A&P                          Several days after fall, patient still having pain, pain worse with cough or deep inspiration.  No shortness of breath, no recent fevers chills.  Tolerating p.o., normal bowel function normal urinary function.  Patient is having breakthrough pain.  He believes the muscle relaxers or  narcotic made him have hallucinations and does not want to take medication the trunk.  Tylenol and Toradol are provided.  We will repeat chest x-ray.  Patient is feeling better after ibuprofen and Toradol.  He says he does not like to take medication because he is afraid of the side effects.  I advised him that he would likely need to schedule Tylenol and ibuprofen to avoid getting his pain out of control.  He continues to do incentive spirometry at home at least 10 times a day reaching up to 1.5 L easily, I advised him to try and increase this goal each day.  He agrees.  Chest x-ray reviewed by myself rib fractures and  possible extrapleural hemorrhage, patient is hemodynamically stable, breathing comfortably, just has pain control issues. I feel that due to the chronicity of this injury and his ability to do incentive spirometry breathe comfortably at home, he does not need further traumatic work-up at this time. He is tolerating p.o. with normal bowel function abdominal pain. He has no neck pain no head pain. Feel that any injuries that we may not evaluate this time would likely be stable and not need intervention. Patient agrees and is discharged home  In discussing patient's discharge instructions, he elicited that this chest is still uncomfortable, he is unclear whether or not this is new pain or the pain he has felt since the fall, is reproducible however we will get an EKG to make sure not missing any new ischemic changes.  The EKG is without ischemic change interval abnormality or arrhythmia.  Patient is safe for discharge home, pain is likely secondary to chest wall injury.  Outpatient medications recommended outpatient follow-up recommended.  Final Clinical Impression(s) / ED Diagnoses Final diagnoses:  Fall, subsequent encounter  Multiple closed fractures of ribs of both sides with routine healing, subsequent encounter    Rx / DC Orders ED Discharge Orders    None       Sabino Donovan,  MD 12/20/19 0505    Sabino Donovan, MD 12/20/19 [phone]

## 2020-08-04 ENCOUNTER — Other Ambulatory Visit: Payer: Self-pay

## 2020-08-04 ENCOUNTER — Emergency Department (HOSPITAL_COMMUNITY)
Admission: EM | Admit: 2020-08-04 | Discharge: 2020-08-04 | Disposition: A | Discharge status: Home / Self Care | Payer: Self-pay | Attending: Emergency Medicine | Admitting: Emergency Medicine

## 2020-08-04 ENCOUNTER — Encounter (HOSPITAL_COMMUNITY): Payer: Self-pay

## 2020-08-04 DIAGNOSIS — R531 Weakness: Secondary | ICD-10-CM | POA: Insufficient documentation

## 2020-08-04 DIAGNOSIS — F17210 Nicotine dependence, cigarettes, uncomplicated: Secondary | ICD-10-CM | POA: Insufficient documentation

## 2020-08-04 DIAGNOSIS — E860 Dehydration: Secondary | ICD-10-CM | POA: Insufficient documentation

## 2020-08-04 DIAGNOSIS — R197 Diarrhea, unspecified: Secondary | ICD-10-CM | POA: Insufficient documentation

## 2020-08-04 DIAGNOSIS — R5383 Other fatigue: Secondary | ICD-10-CM | POA: Insufficient documentation

## 2020-08-04 DIAGNOSIS — Z20822 Contact with and (suspected) exposure to covid-19: Secondary | ICD-10-CM | POA: Insufficient documentation

## 2020-08-04 LAB — COMPREHENSIVE METABOLIC PANEL
ALT: 89 U/L — ABNORMAL HIGH (ref 0–44)
AST: 192 U/L — ABNORMAL HIGH (ref 15–41)
Albumin: 3.6 g/dL (ref 3.5–5.0)
Alkaline Phosphatase: 45 U/L (ref 38–126)
Anion gap: 9 (ref 5–15)
BUN: 16 mg/dL (ref 6–20)
CO2: 27 mmol/L (ref 22–32)
Calcium: 8.7 mg/dL — ABNORMAL LOW (ref 8.9–10.3)
Chloride: 93 mmol/L — ABNORMAL LOW (ref 98–111)
Creatinine, Ser: 1.15 mg/dL (ref 0.61–1.24)
GFR, Estimated: >60 mL/min (ref >60)
Glucose, Bld: 140 mg/dL — ABNORMAL HIGH (ref 70–99)
Potassium: 3.4 mmol/L — ABNORMAL LOW (ref 3.5–5.1)
Sodium: 129 mmol/L — ABNORMAL LOW (ref 135–145)
Total Bilirubin: 0.8 mg/dL (ref 0.3–1.2)
Total Protein: 7.8 g/dL (ref 6.5–8.1)

## 2020-08-04 LAB — CBC WITH DIFFERENTIAL/PLATELET
Abs Immature Granulocytes: 0.02 10*3/uL (ref 0.00–0.07)
Basophils Absolute: 0.1 10*3/uL (ref 0.0–0.1)
Basophils Relative: 2 %
Eosinophils Absolute: 0 10*3/uL (ref 0.0–0.5)
Eosinophils Relative: 0 %
HCT: 42 % (ref 39.0–52.0)
Hemoglobin: 14.8 g/dL (ref 13.0–17.0)
Immature Granulocytes: 1 %
Lymphocytes Relative: 73 %
Lymphs Abs: 2.6 10*3/uL (ref 0.7–4.0)
MCH: 30.2 pg (ref 26.0–34.0)
MCHC: 35.2 g/dL (ref 30.0–36.0)
MCV: 85.7 fL (ref 80.0–100.0)
Monocytes Absolute: 0.4 10*3/uL (ref 0.1–1.0)
Monocytes Relative: 11 %
Neutro Abs: 0.5 10*3/uL — ABNORMAL LOW (ref 1.7–7.7)
Neutrophils Relative %: 13 %
Platelets: UNDETERMINED 10*3/uL (ref 150–400)
RBC: 4.9 MIL/uL (ref 4.22–5.81)
RDW: 12.6 % (ref 11.5–15.5)
WBC: 3.5 10*3/uL — ABNORMAL LOW (ref 4.0–10.5)
nRBC: 0 % (ref 0.0–0.2)

## 2020-08-04 LAB — RESP PANEL BY RT-PCR (FLU A&B, COVID) ARPGX2
Influenza A by PCR: NEGATIVE
Influenza B by PCR: NEGATIVE
SARS Coronavirus 2 by RT PCR: NEGATIVE

## 2020-08-04 LAB — URINALYSIS, ROUTINE W REFLEX MICROSCOPIC
Bilirubin Urine: NEGATIVE
Glucose, UA: NEGATIVE mg/dL
Ketones, ur: NEGATIVE mg/dL
Leukocytes,Ua: NEGATIVE
Nitrite: NEGATIVE
Protein, ur: 100 mg/dL — AB
Specific Gravity, Urine: 1.02 (ref 1.005–1.030)
pH: 5 (ref 5.0–8.0)

## 2020-08-04 MED ORDER — SODIUM CHLORIDE 0.9 % IV BOLUS
1000.0000 mL | Freq: Once | INTRAVENOUS | Status: AC
  Administered 2020-08-04: 16:00:00 1000 mL via INTRAVENOUS

## 2020-08-04 NOTE — ED Provider Notes (Signed)
Emergency Medicine Provider Triage Evaluation Note  Mark Thornton , a 50 y.o. male  was evaluated in triage.  Pt complains of watery diarrhea since Monday with associated generalized weakness, fatigue, increased sleepiness. PT also endorses increased thirst and feels like he could be dehydrated. No increased urination. No recent sick contacts. Is unvaccinated for COVID. No fevers or chills. No abdominal pain. NO vomiting.   Review of Systems  Positive: + diarrhea, fatigue, weakness Negative: - abdominal pain, vomiting  Physical Exam  BP 119/87 (BP Location: Right Arm)   Pulse 82   Temp 98.5 F (36.9 C) (Oral)   Resp 17   SpO2 99%  Gen:   Awake, no distress   Resp:  Normal effort  MSK:   Moves extremities without difficulty  Other:    Medical Decision Making  Medically screening exam initiated at 8:47 AM.  Appropriate orders placed.  Mark Thornton was informed that the remainder of the evaluation will be completed by another provider, this initial triage assessment does not replace that evaluation, and the importance of remaining in the ED until their evaluation is complete.     Tanda Rockers, PA-C 08/04/20 4562    Maia Plan, MD 08/06/20 [phone]

## 2020-08-04 NOTE — Discharge Instructions (Addendum)
Return for any problem.  ?

## 2020-08-04 NOTE — ED Triage Notes (Signed)
Patient complains of 1 week of fatigue, weakness and diarrhea. States that he works outside and concerned that he may be dehydrated. Alert and oriented, denies fever

## 2020-08-04 NOTE — ED Notes (Signed)
Discharge instructions reviewed and explained, pt verbalized understanding. Community health and wellness resources provided.

## 2020-08-04 NOTE — ED Provider Notes (Signed)
[facility] EMERGENCY DEPARTMENT Provider Note   CSN: [id number] Arrival date & time: 08/04/20  0805     History No chief complaint on file.   Mark Thornton is a 50 y.o. male.  50 year old male with prior medical history as detailed below presents for evaluation.  Patient reports loose watery diarrhea for the last 4 to 5 days.  He denies vomiting.  He denies abdominal discomfort or pain.  He reports that he has been working outside through the heat of the last several days.  He is concerned that he is dehydrated.  He complains of generalized weakness and fatigue.  He is concerned that he has not been able to keep up with his fluid losses.  He denies blood in the stool.  He denies fever.  He denies significant sick contacts with similar symptoms.  The history is provided by the patient and medical records.  Diarrhea Quality:  Watery Severity:  Mild Onset quality:  Gradual Duration:  4 days Timing:  Constant Progression:  Partially resolved Relieved by:  Nothing Worsened by:  Nothing     History reviewed. No pertinent past medical history.  Patient Active Problem List   Diagnosis Date Noted   Intermittent explosive disorder 12/11/2019   Psychosis, affective (HCC) 12/11/2019   Alcohol abuse 12/11/2019    No past surgical history on file.     No family history on file.  Social History   Tobacco Use   Smoking status: Every Day    Pack years: 0.00    Types: Cigarettes  Substance Use Topics   Alcohol use: Yes    Comment: daily   Drug use: Yes    Types: IV, Cocaine    Home Medications Prior to Admission medications   Medication Sig Start Date End Date Taking? Authorizing Provider  cyclobenzaprine (FLEXERIL) 10 MG tablet Take 1 tablet (10 mg total) by mouth 2 (two) times daily as needed for up to 20 doses for muscle spasms. 12/07/19   Curatolo, Adam, DO  OLANZapine (ZYPREXA) 10 MG tablet Take 1 tablet (10 mg total) by mouth in the morning  and at bedtime. 12/12/19 01/11/20  Terald Sleeper, MD  oxyCODONE (ROXICODONE) 5 MG immediate release tablet Take 1 tablet (5 mg total) by mouth every 4 (four) hours as needed for up to 20 doses for severe pain. 12/07/19   Virgina Norfolk, DO    Allergies    Patient has no known allergies.  Review of Systems   Review of Systems  Gastrointestinal:  Positive for diarrhea.  All other systems reviewed and are negative.  Physical Exam Updated Vital Signs BP 107/72   Pulse 66   Temp 99 F (37.2 C) (Oral)   Resp 16   SpO2 100%   Physical Exam Vitals and nursing note reviewed.  Constitutional:      General: He is not in acute distress.    Appearance: Normal appearance. He is well-developed.  HENT:     Head: Normocephalic and atraumatic.     Nose: Nose normal.     Mouth/Throat:     Mouth: Mucous membranes are dry.  Eyes:     Conjunctiva/sclera: Conjunctivae normal.     Pupils: Pupils are equal, round, and reactive to light.  Cardiovascular:     Rate and Rhythm: Normal rate and regular rhythm.     Heart sounds: Normal heart sounds.  Pulmonary:     Effort: Pulmonary effort is normal. No respiratory distress.     Breath  sounds: Normal breath sounds.  Abdominal:     General: Abdomen is flat. There is no distension.     Palpations: Abdomen is soft.     Tenderness: There is no abdominal tenderness.  Musculoskeletal:        General: No deformity. Normal range of motion.     Cervical back: Normal range of motion and neck supple.  Skin:    General: Skin is warm and dry.  Neurological:     Mental Status: He is alert and oriented to person, place, and time.    ED Results / Procedures / Treatments   Labs (all labs ordered are listed, but only abnormal results are displayed) Labs Reviewed  COMPREHENSIVE METABOLIC PANEL - Abnormal; Notable for the following components:      Result Value   Sodium 129 (*)    Potassium 3.4 (*)    Chloride 93 (*)    Glucose, Bld 140 (*)     Calcium 8.7 (*)    AST 192 (*)    ALT 89 (*)    All other components within normal limits  CBC WITH DIFFERENTIAL/PLATELET - Abnormal; Notable for the following components:   WBC 3.5 (*)    Neutro Abs 0.5 (*)    All other components within normal limits  URINALYSIS, ROUTINE W REFLEX MICROSCOPIC - Abnormal; Notable for the following components:   Color, Urine AMBER (*)    APPearance HAZY (*)    Hgb urine dipstick MODERATE (*)    Protein, ur 100 (*)    Bacteria, UA RARE (*)    All other components within normal limits  RESP PANEL BY RT-PCR (FLU A&B, COVID) ARPGX2  PATHOLOGIST SMEAR REVIEW    EKG None  Radiology No results found.  Procedures Procedures   Medications Ordered in ED Medications  sodium chloride 0.9 % bolus 1,000 mL (has no administration in time range)    ED Course  I have reviewed the triage vital signs and the nursing notes.  Pertinent labs & imaging results that were available during my care of the patient were reviewed by me and considered in my medical decision making (see chart for details).    MDM Rules/Calculators/A&P                          MDM  MSE complete  Mark Thornton was evaluated in Emergency Department on 08/04/2020 for the symptoms described in the history of present illness. He was evaluated in the context of the global COVID-19 pandemic, which necessitated consideration that the patient might be at risk for infection with the SARS-CoV-2 virus that causes COVID-19. Institutional protocols and algorithms that pertain to the evaluation of patients at risk for COVID-19 are in a state of rapid change based on information released by regulatory bodies including the CDC and federal and state organizations. These policies and algorithms were followed during the patient's care in the ED.  Patient is presenting with complaint of weakness and feelings of dehydration.  Patient works outside in the last several days of and into the high  90s.  He also reports intermittent diarrhea and loose stools over the last several days as well.  He also reports alcohol intake last weekend which may have precipitated much of his symptoms.  Work-up in the ED is suggestive of mild to moderate dehydration.  Patient is significantly improved now after IV fluids.  He now desires discharge home.  He does understand need for close follow-up.  Strict return precautions given and understood.  Final Clinical Impression(s) / ED Diagnoses Final diagnoses:  Diarrhea, unspecified type  Dehydration    Rx / DC Orders ED Discharge Orders     None        Wynetta Fines, MD 08/04/20 1731

## 2020-08-06 LAB — PATHOLOGIST SMEAR REVIEW

## 2020-08-13 ENCOUNTER — Other Ambulatory Visit: Payer: Self-pay

## 2020-08-13 ENCOUNTER — Inpatient Hospital Stay (HOSPITAL_BASED_OUTPATIENT_CLINIC_OR_DEPARTMENT_OTHER)
Admission: EM | Admit: 2020-08-13 | Discharge: 2020-08-16 | DRG: 345 | Disposition: A | Discharge status: Home / Self Care | Payer: Self-pay | Attending: Internal Medicine | Admitting: Internal Medicine

## 2020-08-13 ENCOUNTER — Encounter (HOSPITAL_BASED_OUTPATIENT_CLINIC_OR_DEPARTMENT_OTHER): Payer: Self-pay | Admitting: Emergency Medicine

## 2020-08-13 ENCOUNTER — Emergency Department (HOSPITAL_BASED_OUTPATIENT_CLINIC_OR_DEPARTMENT_OTHER): Payer: Self-pay

## 2020-08-13 DIAGNOSIS — F17210 Nicotine dependence, cigarettes, uncomplicated: Secondary | ICD-10-CM | POA: Diagnosis present

## 2020-08-13 DIAGNOSIS — F1010 Alcohol abuse, uncomplicated: Secondary | ICD-10-CM | POA: Diagnosis present

## 2020-08-13 DIAGNOSIS — E279 Disorder of adrenal gland, unspecified: Secondary | ICD-10-CM | POA: Diagnosis present

## 2020-08-13 DIAGNOSIS — F6381 Intermittent explosive disorder: Secondary | ICD-10-CM | POA: Diagnosis present

## 2020-08-13 DIAGNOSIS — Z20822 Contact with and (suspected) exposure to covid-19: Secondary | ICD-10-CM | POA: Diagnosis present

## 2020-08-13 DIAGNOSIS — E871 Hypo-osmolality and hyponatremia: Secondary | ICD-10-CM | POA: Diagnosis present

## 2020-08-13 DIAGNOSIS — F29 Unspecified psychosis not due to a substance or known physiological condition: Secondary | ICD-10-CM | POA: Diagnosis present

## 2020-08-13 DIAGNOSIS — E876 Hypokalemia: Secondary | ICD-10-CM | POA: Diagnosis present

## 2020-08-13 DIAGNOSIS — E860 Dehydration: Secondary | ICD-10-CM | POA: Diagnosis present

## 2020-08-13 DIAGNOSIS — K625 Hemorrhage of anus and rectum: Secondary | ICD-10-CM | POA: Diagnosis present

## 2020-08-13 DIAGNOSIS — F39 Unspecified mood [affective] disorder: Secondary | ICD-10-CM | POA: Diagnosis present

## 2020-08-13 DIAGNOSIS — Z79899 Other long term (current) drug therapy: Secondary | ICD-10-CM

## 2020-08-13 DIAGNOSIS — K611 Rectal abscess: Principal | ICD-10-CM

## 2020-08-13 LAB — CBC WITH DIFFERENTIAL/PLATELET
Abs Immature Granulocytes: 0.08 10*3/uL — ABNORMAL HIGH (ref 0.00–0.07)
Basophils Absolute: 0.1 10*3/uL (ref 0.0–0.1)
Basophils Relative: 1 %
Eosinophils Absolute: 0 10*3/uL (ref 0.0–0.5)
Eosinophils Relative: 0 %
HCT: 36.3 % — ABNORMAL LOW (ref 39.0–52.0)
Hemoglobin: 12.9 g/dL — ABNORMAL LOW (ref 13.0–17.0)
Immature Granulocytes: 1 %
Lymphocytes Relative: 38 %
Lymphs Abs: 3.5 10*3/uL (ref 0.7–4.0)
MCH: 29.9 pg (ref 26.0–34.0)
MCHC: 35.5 g/dL (ref 30.0–36.0)
MCV: 84 fL (ref 80.0–100.0)
Monocytes Absolute: 2.1 10*3/uL — ABNORMAL HIGH (ref 0.1–1.0)
Monocytes Relative: 24 %
Neutro Abs: 3.3 10*3/uL (ref 1.7–7.7)
Neutrophils Relative %: 36 %
Platelets: 255 10*3/uL (ref 150–400)
RBC: 4.32 MIL/uL (ref 4.22–5.81)
RDW: 12.7 % (ref 11.5–15.5)
WBC Morphology: ABNORMAL
WBC: 9 10*3/uL (ref 4.0–10.5)
nRBC: 0 % (ref 0.0–0.2)

## 2020-08-13 LAB — MAGNESIUM: Magnesium: 2 mg/dL (ref 1.7–2.4)

## 2020-08-13 LAB — URINALYSIS, ROUTINE W REFLEX MICROSCOPIC
Bilirubin Urine: NEGATIVE
Glucose, UA: NEGATIVE mg/dL
Ketones, ur: NEGATIVE mg/dL
Leukocytes,Ua: NEGATIVE
Nitrite: NEGATIVE
Protein, ur: NEGATIVE mg/dL
Specific Gravity, Urine: <1.005 — ABNORMAL LOW (ref 1.005–1.030)
pH: 6 (ref 5.0–8.0)

## 2020-08-13 LAB — COMPREHENSIVE METABOLIC PANEL
ALT: 29 U/L (ref 0–44)
AST: 47 U/L — ABNORMAL HIGH (ref 15–41)
Albumin: 3.1 g/dL — ABNORMAL LOW (ref 3.5–5.0)
Alkaline Phosphatase: 43 U/L (ref 38–126)
Anion gap: 10 (ref 5–15)
BUN: 10 mg/dL (ref 6–20)
CO2: 27 mmol/L (ref 22–32)
Calcium: 8 mg/dL — ABNORMAL LOW (ref 8.9–10.3)
Chloride: 91 mmol/L — ABNORMAL LOW (ref 98–111)
Creatinine, Ser: 0.83 mg/dL (ref 0.61–1.24)
GFR, Estimated: >60 mL/min (ref >60)
Glucose, Bld: 101 mg/dL — ABNORMAL HIGH (ref 70–99)
Potassium: 3.2 mmol/L — ABNORMAL LOW (ref 3.5–5.1)
Sodium: 128 mmol/L — ABNORMAL LOW (ref 135–145)
Total Bilirubin: 0.4 mg/dL (ref 0.3–1.2)
Total Protein: 7.5 g/dL (ref 6.5–8.1)

## 2020-08-13 LAB — URINALYSIS, MICROSCOPIC (REFLEX): WBC, UA: NONE SEEN WBC/hpf (ref 0–5)

## 2020-08-13 LAB — RESP PANEL BY RT-PCR (FLU A&B, COVID) ARPGX2
Influenza A by PCR: NEGATIVE
Influenza B by PCR: NEGATIVE
SARS Coronavirus 2 by RT PCR: NEGATIVE

## 2020-08-13 LAB — LIPASE, BLOOD: Lipase: 50 U/L (ref 11–51)

## 2020-08-13 LAB — C DIFFICILE QUICK SCREEN W PCR REFLEX
C Diff antigen: NEGATIVE
C Diff interpretation: NOT DETECTED
C Diff toxin: NEGATIVE

## 2020-08-13 MED ORDER — POTASSIUM CHLORIDE IN NACL 40-0.9 MEQ/L-% IV SOLN
INTRAVENOUS | Status: AC
  Filled 2020-08-13: qty 1000

## 2020-08-13 MED ORDER — HYDROMORPHONE HCL 1 MG/ML IJ SOLN
1.0000 mg | Freq: Once | INTRAMUSCULAR | Status: AC
  Administered 2020-08-13: 1 mg via INTRAVENOUS
  Filled 2020-08-13: qty 1

## 2020-08-13 MED ORDER — SODIUM CHLORIDE 0.9 % IV BOLUS
1000.0000 mL | Freq: Once | INTRAVENOUS | Status: AC
  Administered 2020-08-13: 15:00:00 1000 mL via INTRAVENOUS

## 2020-08-13 MED ORDER — ONDANSETRON HCL 4 MG/2ML IJ SOLN: 4.0000 mg | Freq: Four times a day (QID) | INTRAMUSCULAR | Status: DC | PRN

## 2020-08-13 MED ORDER — ACETAMINOPHEN 325 MG PO TABS
650.0000 mg | ORAL_TABLET | Freq: Once | ORAL | Status: AC
  Administered 2020-08-13: 14:00:00 650 mg via ORAL
  Filled 2020-08-13: qty 2

## 2020-08-13 MED ORDER — ONDANSETRON HCL 4 MG/2ML IJ SOLN
4.0000 mg | Freq: Once | INTRAMUSCULAR | Status: AC
  Administered 2020-08-13: 4 mg via INTRAVENOUS
  Filled 2020-08-13: qty 2

## 2020-08-13 MED ORDER — PIPERACILLIN-TAZOBACTAM 3.375 G IVPB
3.3750 g | Freq: Three times a day (TID) | INTRAVENOUS | Status: DC
  Administered 2020-08-14 – 2020-08-16 (×8): 3.375 g via INTRAVENOUS
  Filled 2020-08-13 (×10): qty 50

## 2020-08-13 MED ORDER — SODIUM CHLORIDE 0.9 % IV BOLUS
500.0000 mL | Freq: Once | INTRAVENOUS | Status: AC
  Administered 2020-08-13: 500 mL via INTRAVENOUS

## 2020-08-13 MED ORDER — POTASSIUM CHLORIDE CRYS ER 20 MEQ PO TBCR
40.0000 meq | EXTENDED_RELEASE_TABLET | Freq: Once | ORAL | Status: AC
  Administered 2020-08-13: 40 meq via ORAL
  Filled 2020-08-13: qty 2

## 2020-08-13 MED ORDER — IOHEXOL 300 MG/ML  SOLN
100.0000 mL | Freq: Once | INTRAMUSCULAR | Status: AC | PRN
  Administered 2020-08-13: 100 mL via INTRAVENOUS

## 2020-08-13 MED ORDER — ACETAMINOPHEN 500 MG PO TABS: 1000.0000 mg | ORAL_TABLET | Freq: Four times a day (QID) | ORAL | Status: DC | PRN

## 2020-08-13 MED ORDER — HYDROMORPHONE HCL 1 MG/ML IJ SOLN
1.0000 mg | INTRAMUSCULAR | Status: DC | PRN
  Administered 2020-08-14 – 2020-08-15 (×7): 1 mg via INTRAVENOUS
  Filled 2020-08-13 (×9): qty 1

## 2020-08-13 MED ORDER — ONDANSETRON HCL 4 MG PO TABS: 4.0000 mg | ORAL_TABLET | Freq: Four times a day (QID) | ORAL | Status: DC | PRN

## 2020-08-13 MED ORDER — MORPHINE SULFATE (PF) 4 MG/ML IV SOLN
4.0000 mg | Freq: Once | INTRAVENOUS | Status: AC
  Administered 2020-08-13: 4 mg via INTRAVENOUS
  Filled 2020-08-13: qty 1

## 2020-08-13 MED ORDER — POTASSIUM CHLORIDE 20 MEQ PO PACK
40.0000 meq | PACK | Freq: Once | ORAL | Status: DC
  Filled 2020-08-13: qty 2

## 2020-08-13 MED ORDER — ACETAMINOPHEN 650 MG RE SUPP: 650.0000 mg | Freq: Four times a day (QID) | RECTAL | Status: DC | PRN

## 2020-08-13 NOTE — ED Provider Notes (Addendum)
  Physical Exam  BP (!) 145/81 (BP Location: Left Arm)   Pulse 84   Temp 99.2 F (37.3 C) (Oral)   Resp 16   Ht 5\' 11"  (1.803 m)   Wt 78.9 kg   SpO2 100%   BMI 24.27 kg/m   Physical Exam  ED Course/Procedures     Procedures  MDM  Patient transferred from [facility].  Perirectal abscess.  4 cm large.  Sitting on chair buttock angled up due to the pain.  Initially had fever.  Will give more pain meds here.  Patient has had some ice but otherwise NPO.  States he is thirsty.  Will discuss with general surgery  Discussed with general surgery, [facility]. [facility].  Likely have OR tomorrow not likely night.  There is a chance of him going to [facility].  With patient's comorbidities of the psychiatric issues and alcohol use Dr. [facility] feels the patient would benefit appearing admitted to a medicine service and they will consult.       Andrey Campanile, MD 08/13/20 08/15/20    Graylon Gunning, MD 08/13/20 [phone]

## 2020-08-13 NOTE — ED Notes (Signed)
Report given to [facility]. 

## 2020-08-13 NOTE — Plan of Care (Signed)
Poc initiated and pt's handbook in room, reviewed use of call bell for needs/safety

## 2020-08-13 NOTE — ED Triage Notes (Addendum)
Pt presents to ED Pov. Pt c/o rectal pain, diarrhea, and fatigue. Pt reports that his diarrhea has not stopped since he was seen on 6/18. Pt denies dark stools. Fever in triage. Hx of hemorrhoids

## 2020-08-13 NOTE — H&P (Signed)
History and Physical    Mark Thornton ZOX:[id number] DOB: 04/10/1970 DOA: 08/13/2020  PCP: Patient, No Pcp Per (Inactive)  Patient coming from: Home  I have personally briefly reviewed patient's old medical records in [place]  Chief Complaint: Rectal pain, diarrhea  HPI: Mark Thornton is a 50 y.o. male with medical history significant for polysubstance use disorder (alcohol, cocaine, tobacco) and affective psychosis who presented to the ED for evaluation of rectal pain.  Patient reports 2 weeks of rectal pain with severe worsening pain last couple days.  He has been having loose stools without evidence of purulent discharge from rectal bleeding.  He denies any subjective fevers, chills, diaphoresis, abdominal pain, chest pain, dyspnea.  He states that he is a current smoker of about a pack per day but has not smoked the last few days since his symptoms worsen.  He reports occasional alcohol use with last drink about 3 weeks ago.  He denies any recent illicit drug use.  He says he is not currently taking any medications.  He denies any known medical conditions in his immediate family.  ED Course:  Initial vitals showed BP 119/91, pulse 90, RR 18, temp 1 1.0 F, SPO2 97% on room air.  Labs showed WBC 9.0, hemoglobin 12.9, platelets 255,000, sodium 128, potassium 3.2, bicarb 27, BUN 10, creatinine 0.83, serum glucose 101, AST 47, ALT 29, alk phos 43, total bilirubin 0.4, lipase 50.  Urinalysis negative for UTI.  Blood cultures obtained and pending.  GI pathogen panel in process.  C. difficile negative.  SARS-CoV-2 PCR negative.  Influenza A/B negative.  CT abdomen/pelvis with contrast shows a posterior perirectal abscess.  Fatty liver with suspected hepatic cyst versus hemangioma within small left adrenal mass which may represent an adrenal adenoma also noted.  EDP discussed with on-call general surgery who recommended ED to ED transfer to [facility]  Patient was given 1.5  L NS, 40 mEq oral potassium, IV Dilaudid 1 mg x 3, and IV morphine 4 mg.  General surgery recommended medical admission with plan for potential OR tomorrow.  Review of Systems: All systems reviewed and are negative except as documented in history of present illness above.   History reviewed. No pertinent past medical history.  History reviewed. No pertinent surgical history.  Social History:  reports that he has been smoking cigarettes. He does not have any smokeless tobacco history on file. He reports current alcohol use. He reports current drug use. Drugs: IV and Cocaine.  No Known Allergies  History reviewed. No pertinent family history.   Prior to Admission medications   Medication Sig Start Date End Date Taking? Authorizing Provider  acetaminophen (TYLENOL) 500 MG tablet Take 1,000 mg by mouth every 6 (six) hours as needed for mild pain or headache.    [provider]  Multiple Vitamin (MULTIVITAMIN) capsule Take 1 capsule by mouth daily.    [provider]  OLANZapine (ZYPREXA) 10 MG tablet Take 1 tablet (10 mg total) by mouth in the morning and at bedtime. 12/12/19 01/11/20  Wyvonnia Dusky, MD  oxyCODONE (ROXICODONE) 5 MG immediate release tablet Take 1 tablet (5 mg total) by mouth every 4 (four) hours as needed for up to 20 doses for severe pain. Patient not taking: Reported on 08/04/2020 12/07/19   Lennice Sites, DO    Physical Exam: Vitals:   08/13/20 1937 08/13/20 2000 08/13/20 2100 08/13/20 2235  BP: (!) 145/81 (!) 153/97 (!) 159/99 (!) 167/95  Pulse: 84 84  79 90  Resp: 16 15 17 20   Temp:    99.4 F (37.4 C)  TempSrc:    Oral  SpO2: 100% 98% 99% 100%  Weight:      Height:       Constitutional: Resting in chair in the right lateral decubitus position, appears tired but in NAD, calm, comfortable Eyes: PERRL, lids and conjunctivae normal ENMT: Mucous membranes are moist. Posterior pharynx clear of any exudate or lesions.Normal dentition.  Neck:  normal, supple, no masses. Respiratory: clear to auscultation bilaterally, no wheezing, no crackles. Normal respiratory effort. No accessory muscle use.  Cardiovascular: Regular rate and rhythm, systolic murmur present. No extremity edema. 2+ pedal pulses. Abdomen: no tenderness, no masses palpated. No hepatosplenomegaly. Bowel sounds positive. Rectal: Some swelling/fluctuance and tenderness to palpation at the 2 o'clock position Musculoskeletal: no clubbing / cyanosis. No joint deformity upper and lower extremities. Good ROM, no contractures. Normal muscle tone.  Skin: no rashes, lesions, ulcers. No induration Neurologic: CN 2-12 grossly intact. Sensation intact. Strength 5/5 in all 4.  Psychiatric: Normal judgment and insight. Alert and oriented x 3. Normal mood.   Labs on Admission: I have personally reviewed following labs and imaging studies  CBC: Recent Labs  Lab 08/13/20 1431  WBC 9.0  NEUTROABS 3.3  HGB 12.9*  HCT 36.3*  MCV 84.0  PLT 725   Basic Metabolic Panel: Recent Labs  Lab 08/13/20 1431 08/13/20 2233  NA 128*  --   K 3.2*  --   CL 91*  --   CO2 27  --   GLUCOSE 101*  --   BUN 10  --   CREATININE 0.83  --   CALCIUM 8.0*  --   MG  --  2.0   GFR: Estimated Creatinine Clearance: 113.4 mL/min (by C-G formula based on SCr of 0.83 mg/dL). Liver Function Tests: Recent Labs  Lab 08/13/20 1431  AST 47*  ALT 29  ALKPHOS 43  BILITOT 0.4  PROT 7.5  ALBUMIN 3.1*   Recent Labs  Lab 08/13/20 1431  LIPASE 50   No results for input(s): AMMONIA in the last 168 hours. Coagulation Profile: No results for input(s): INR, PROTIME in the last 168 hours. Cardiac Enzymes: No results for input(s): CKTOTAL, CKMB, CKMBINDEX, TROPONINI in the last 168 hours. BNP (last 3 results) No results for input(s): PROBNP in the last 8760 hours. HbA1C: No results for input(s): HGBA1C in the last 72 hours. CBG: No results for input(s): GLUCAP in the last 168 hours. Lipid  Profile: No results for input(s): CHOL, HDL, LDLCALC, TRIG, CHOLHDL, LDLDIRECT in the last 72 hours. Thyroid Function Tests: No results for input(s): TSH, T4TOTAL, FREET4, T3FREE, THYROIDAB in the last 72 hours. Anemia Panel: No results for input(s): VITAMINB12, FOLATE, FERRITIN, TIBC, IRON, RETICCTPCT in the last 72 hours. Urine analysis:    Component Value Date/Time   COLORURINE YELLOW 08/13/2020 1525   APPEARANCEUR CLEAR 08/13/2020 1525   LABSPEC <1.005 (L) 08/13/2020 1525   PHURINE 6.0 08/13/2020 1525   GLUCOSEU NEGATIVE 08/13/2020 1525   HGBUR TRACE (A) 08/13/2020 1525   BILIRUBINUR NEGATIVE 08/13/2020 1525   KETONESUR NEGATIVE 08/13/2020 1525   PROTEINUR NEGATIVE 08/13/2020 1525   NITRITE NEGATIVE 08/13/2020 [place] 08/13/2020 1525    Radiological Exams on Admission: CT ABDOMEN PELVIS W CONTRAST  Result Date: 08/13/2020 CLINICAL DATA:  Rectal pain and diarrhea. EXAM: CT ABDOMEN AND PELVIS WITH CONTRAST TECHNIQUE: Multidetector CT imaging of the abdomen and pelvis was performed  using the standard protocol following bolus administration of intravenous contrast. CONTRAST:  148m OMNIPAQUE IOHEXOL 300 MG/ML  SOLN COMPARISON:  None. FINDINGS: Lower chest: No acute abnormality. Hepatobiliary: There is diffuse fatty infiltration of the liver parenchyma. A 1.4 cm x 1.1 cm focus of parenchymal low attenuation is seen within the anteromedial aspect of the right lobe of the liver. Additional subcentimeter foci of parenchymal low attenuation are seen within the posterior aspect of the right lobe and anterior aspect of the left lobe. No gallstones, gallbladder wall thickening, or biliary dilatation. Pancreas: Unremarkable. No pancreatic ductal dilatation or surrounding inflammatory changes. Spleen: Normal in size without focal abnormality. Adrenals/Urinary Tract: The right adrenal gland is normal in size and appearance. A 1.0 cm x 1.0 cm low-attenuation left adrenal mass is  seen. Kidneys are normal, without renal calculi, focal lesion, or hydronephrosis. Bladder is unremarkable. Stomach/Bowel: Stomach is within normal limits. Appendix appears normal. No evidence of bowel wall thickening, distention, or inflammatory changes. Vascular/Lymphatic: Aortic atherosclerosis. No enlarged abdominal or pelvic lymph nodes. Reproductive: The prostate gland is mildly enlarged Other: No abdominal wall hernia or abnormality. No abdominopelvic ascites. Musculoskeletal: No acute or significant osseous findings. IMPRESSION: 1. Findings consistent with a posterior perirectal abscess. 2. Fatty liver with suspected hepatic cysts versus hemangiomas. 3. Small left adrenal mass which may represent an adrenal adenoma. Electronically Signed   By: TVirgina NorfolkM.D.   On: 08/13/2020 15:26    EKG: Not performed.  Assessment/Plan Principal Problem:   Perirectal abscess Active Problems:   Alcohol abuse   Hyponatremia   Hypokalemia   Mark Thornton a 50y.o. male with medical history significant for polysubstance use disorder (alcohol, cocaine, tobacco) and affective psychosis who is admitted with a perirectal abscess.  Perirectal abscess: General surgery following, plan for potential OR on 6/28.  Started on IV Zosyn.  N.p.o. after midnight.  Analgesics as needed.  Hyponatremia: Mild, start IV NS@100  mL/hour overnight and repeat labs in AM.  Hypokalemia: Supplement and recheck labs in AM.  Affective psychosis: Mood currently stable.  Patient states he is not currently taking any medications for this.  Alcohol use disorder: Reports cutting back with last drink 3 weeks prior to this admission.  Adrenal mass: Small left adrenal mass which may represent an adrenal adenoma seen on CT imaging.  DVT prophylaxis: SCDs Code Status: Full code, confirmed with patient on admission Family Communication: Discussed with patient, he has discussed with family Disposition Plan: Pending  clinical progress Consults called: General surgery Level of care: Med-Surg Admission status:   Status is: Inpatient  Remains inpatient appropriate because:Ongoing active pain requiring inpatient pain management and general surgery intervention.  Dispo: The patient is from: Home              Anticipated d/c is to: Home              Patient currently is not medically stable to d/c.   Difficult to place patient No  VZada FindersMD [facility]  If 7PM-7AM, please contact night-coverage [email or web address]  08/13/2020, 11:10 PM

## 2020-08-13 NOTE — ED Provider Notes (Signed)
[facility] EMERGENCY DEPARTMENT Provider Note   CSN: [id number] Arrival date & time: 08/13/20  1319     History Chief Complaint  Patient presents with   Rectal Pain   Fatigue    Mark Thornton is a 50 y.o. male.  HPI Patient is a 50 year old male who presents to the emergency department due to persistent diarrhea for about 2 weeks.  Initially seen in the emergency department around the onset of his symptoms and was found to be dehydrated and was given IV fluids and discharged in stable condition.  He states that his symptoms have persisted.  Notes 2-3 episodes of watery brown diarrhea per day.  Associated weakness and fatigue.  Denies any fevers, body aches, URI symptoms, penile discharge but does note some mild intermittent dysuria from time to time.  Patient's main complaint is rectal pain.  He notes a history of hemorrhoids and states that this feels similar but much worse.  Denies any recent antibiotic use or drug use.  Denies history of receptive anal intercourse.    History reviewed. No pertinent past medical history.  Patient Active Problem List   Diagnosis Date Noted   Intermittent explosive disorder 12/11/2019   Psychosis, affective (HCC) 12/11/2019   Alcohol abuse 12/11/2019    History reviewed. No pertinent surgical history.     History reviewed. No pertinent family history.  Social History   Tobacco Use   Smoking status: Every Day    Pack years: 0.00    Types: Cigarettes  Substance Use Topics   Alcohol use: Yes    Comment: daily   Drug use: Yes    Types: IV, Cocaine    Home Medications Prior to Admission medications   Medication Sig Start Date End Date Taking? Authorizing Provider  acetaminophen (TYLENOL) 500 MG tablet Take 1,000 mg by mouth every 6 (six) hours as needed for mild pain or headache.    [provider]  Multiple Vitamin (MULTIVITAMIN) capsule Take 1 capsule by mouth daily.    [provider]  OLANZapine  (ZYPREXA) 10 MG tablet Take 1 tablet (10 mg total) by mouth in the morning and at bedtime. 12/12/19 01/11/20  Terald Sleeper, MD  oxyCODONE (ROXICODONE) 5 MG immediate release tablet Take 1 tablet (5 mg total) by mouth every 4 (four) hours as needed for up to 20 doses for severe pain. Patient not taking: Reported on 08/04/2020 12/07/19   Virgina Norfolk, DO    Allergies    Patient has no known allergies.  Review of Systems   Review of Systems  All other systems reviewed and are negative. Ten systems reviewed and are negative for acute change, except as noted in the HPI.   Physical Exam Updated Vital Signs BP (!) 141/77 (BP Location: Right Arm)   Pulse 74   Temp (!) 101 F (38.3 C) (Oral)   Resp 14   Ht 5\' 11"  (1.803 m)   Wt 78.9 kg   SpO2 98%   BMI 24.27 kg/m   Physical Exam Vitals and nursing note reviewed.  Constitutional:      General: He is not in acute distress.    Appearance: Normal appearance. He is normal weight. He is not ill-appearing, toxic-appearing or diaphoretic.  HENT:     Head: Normocephalic and atraumatic.     Right Ear: External ear normal.     Left Ear: External ear normal.     Nose: Nose normal.     Mouth/Throat:     Mouth:  Mucous membranes are moist.     Pharynx: Oropharynx is clear. No oropharyngeal exudate or posterior oropharyngeal erythema.  Eyes:     General: No scleral icterus.       Right eye: No discharge.        Left eye: No discharge.     Extraocular Movements: Extraocular movements intact.     Conjunctiva/sclera: Conjunctivae normal.  Cardiovascular:     Rate and Rhythm: Normal rate and regular rhythm.     Pulses: Normal pulses.     Heart sounds: Normal heart sounds. No murmur heard.   No friction rub. No gallop.  Pulmonary:     Effort: Pulmonary effort is normal. No respiratory distress.     Breath sounds: Normal breath sounds. No stridor. No wheezing, rhonchi or rales.  Abdominal:     General: Abdomen is flat.     Palpations:  Abdomen is soft.     Tenderness: There is no abdominal tenderness.     Comments: Protuberant abdomen that is soft and nontender in all 4 quadrants.  Genitourinary:    Comments: Male nursing chaperone present.  1 cm region of erythema with moderate tenderness noted along the 4 o'clock position of the anal region.  No visible or palpable external hemorrhoids noted.  No drainage from the site. Musculoskeletal:        General: Normal range of motion.     Cervical back: Normal range of motion and neck supple. No tenderness.  Skin:    General: Skin is warm and dry.  Neurological:     General: No focal deficit present.     Mental Status: He is alert and oriented to person, place, and time.  Psychiatric:        Mood and Affect: Mood normal.        Behavior: Behavior normal.    ED Results / Procedures / Treatments   Labs (all labs ordered are listed, but only abnormal results are displayed) Labs Reviewed  COMPREHENSIVE METABOLIC PANEL - Abnormal; Notable for the following components:      Result Value   Sodium 128 (*)    Potassium 3.2 (*)    Chloride 91 (*)    Glucose, Bld 101 (*)    Calcium 8.0 (*)    Albumin 3.1 (*)    AST 47 (*)    All other components within normal limits  CBC WITH DIFFERENTIAL/PLATELET - Abnormal; Notable for the following components:   Hemoglobin 12.9 (*)    HCT 36.3 (*)    Monocytes Absolute 2.1 (*)    Abs Immature Granulocytes 0.08 (*)    All other components within normal limits  URINALYSIS, ROUTINE W REFLEX MICROSCOPIC - Abnormal; Notable for the following components:   Specific Gravity, Urine <1.005 (*)    Hgb urine dipstick TRACE (*)    All other components within normal limits  URINALYSIS, MICROSCOPIC (REFLEX) - Abnormal; Notable for the following components:   Bacteria, UA RARE (*)    All other components within normal limits  RESP PANEL BY RT-PCR (FLU A&B, COVID) ARPGX2  GASTROINTESTINAL PANEL BY PCR, STOOL (REPLACES STOOL CULTURE)  C DIFFICILE  QUICK SCREEN W PCR REFLEX    CULTURE, BLOOD (ROUTINE X 2)  CULTURE, BLOOD (ROUTINE X 2)  LIPASE, BLOOD    EKG None  Radiology CT ABDOMEN PELVIS W CONTRAST  Result Date: 08/13/2020 CLINICAL DATA:  Rectal pain and diarrhea. EXAM: CT ABDOMEN AND PELVIS WITH CONTRAST TECHNIQUE: Multidetector CT imaging of the abdomen and pelvis  was performed using the standard protocol following bolus administration of intravenous contrast. CONTRAST:  100mL OMNIPAQUE IOHEXOL 300 MG/ML  SOLN COMPARISON:  None. FINDINGS: Lower chest: No acute abnormality. Hepatobiliary: There is diffuse fatty infiltration of the liver parenchyma. A 1.4 cm x 1.1 cm focus of parenchymal low attenuation is seen within the anteromedial aspect of the right lobe of the liver. Additional subcentimeter foci of parenchymal low attenuation are seen within the posterior aspect of the right lobe and anterior aspect of the left lobe. No gallstones, gallbladder wall thickening, or biliary dilatation. Pancreas: Unremarkable. No pancreatic ductal dilatation or surrounding inflammatory changes. Spleen: Normal in size without focal abnormality. Adrenals/Urinary Tract: The right adrenal gland is normal in size and appearance. A 1.0 cm x 1.0 cm low-attenuation left adrenal mass is seen. Kidneys are normal, without renal calculi, focal lesion, or hydronephrosis. Bladder is unremarkable. Stomach/Bowel: Stomach is within normal limits. Appendix appears normal. No evidence of bowel wall thickening, distention, or inflammatory changes. Vascular/Lymphatic: Aortic atherosclerosis. No enlarged abdominal or pelvic lymph nodes. Reproductive: The prostate gland is mildly enlarged Other: No abdominal wall hernia or abnormality. No abdominopelvic ascites. Musculoskeletal: No acute or significant osseous findings. IMPRESSION: 1. Findings consistent with a posterior perirectal abscess. 2. Fatty liver with suspected hepatic cysts versus hemangiomas. 3. Small left adrenal mass  which may represent an adrenal adenoma. Electronically Signed   By: Aram Candelahaddeus  Houston M.D.   On: 08/13/2020 15:26    Procedures Procedures   Medications Ordered in ED Medications  acetaminophen (TYLENOL) tablet 650 mg (650 mg Oral Given 08/13/20 1335)  sodium chloride 0.9 % bolus 1,000 mL (0 mLs Intravenous Stopped 08/13/20 1634)  iohexol (OMNIPAQUE) 300 MG/ML solution 100 mL (100 mLs Intravenous Contrast Given 08/13/20 1459)  sodium chloride 0.9 % bolus 500 mL (500 mLs Intravenous New Bag/Given 08/13/20 1634)  potassium chloride SA (KLOR-CON) CR tablet 40 mEq (40 mEq Oral Given 08/13/20 1609)  morphine 4 MG/ML injection 4 mg (4 mg Intravenous Given 08/13/20 1616)    ED Course  I have reviewed the triage vital signs and the nursing notes.  Pertinent labs & imaging results that were available during my care of the patient were reviewed by me and considered in my medical decision making (see chart for details).    MDM Rules/Calculators/A&P                           Pt is a 50 y.o. male who presents to the emergency department with a perirectal abscess.  Labs: CBC with a hemoglobin of 12.9, hematocrit of 36.3, monocytes of 2.1, absolute immature granulocytes of 0.08. CMP with a sodium of 128, potassium of 3.2, chloride of 91, glucose of 101, calcium of 8, albumin of 3.1, AST of 47.  Potassium repleted with Klor-Con.  Patient given 1.5 L of normal saline. Lipase within normal limits at 50. Respiratory panel is negative. UA with trace hemoglobin and rare bacteria.  Imaging: CT scan of the abdomen and pelvis showing findings consistent with a posterior perirectal abscess. 2. Fatty liver with suspected hepatic cysts versus hemangiomas. 3. Small left adrenal mass which may represent an adrenal adenoma.  I, Placido SouLogan Mahonri Seiden, PA-C, personally reviewed and evaluated these images and lab results as part of my medical decision-making.  Patient states he is not anticoagulated.  Denies any p.o.  intake today.  Discussed with Dr. Daphine DeutscherMartin with general surgery who [facility]  will [facility] patient in the ED.  Patient discussed with Dr. Madilyn Hook at [facility] who will accept the patient.  This was discussed with the patient and he is amenable.  Note: Portions of this report may have been transcribed using voice recognition software. Every effort was made to ensure accuracy; however, inadvertent computerized transcription errors may be present.   Final Clinical Impression(s) / ED Diagnoses Final diagnoses:  Perirectal abscess   Rx / DC Orders ED Discharge Orders     None        Placido Sou, PA-C 08/13/20 1656    Pricilla Loveless, MD 08/14/20 [phone]

## 2020-08-13 NOTE — Consult Note (Signed)
CC: my butt hurts  Requesting provider: dr Rubin Payor  HPI: Mark Thornton is an 50 y.o. male who is here for for worsening perirectal pain.  Patient states that he has had some loose stool for the past couple of weeks.  He was seen in the emergency room in June 18 and discharged.  He denies any recent antibiotic use.  He states that his perirectal pain got worse so he represented to the emergency room.  No fever, chills, nausea or vomiting.  No dysuria or trouble urinating.  Has a history of affective psychosis and intermittent explosive disorder.  He states that he has been cutting back on his alcohol and does not drink daily.  He does smoke cigarettes daily.  Denies chest pain, chest pressure, shortness of breath, blood thinners  Remote history of hemorrhoids and possible anal fissure  History reviewed. No pertinent past medical history- see above  History reviewed. No pertinent surgical history.  History reviewed. No pertinent family history.  Social:  reports that he has been smoking cigarettes. He does not have any smokeless tobacco history on file. He reports current alcohol use. He reports current drug use. Drugs: IV and Cocaine.  Allergies: No Known Allergies  Medications: I have reviewed the patient's current medications.   ROS - all of the below systems have been reviewed with the patient and positives are indicated with bold text General: chills, fever or night sweats Eyes: blurry vision or double vision ENT: epistaxis or sore throat Allergy/Immunology: itchy/watery eyes or nasal congestion Hematologic/Lymphatic: bleeding problems, blood clots or swollen lymph nodes Endocrine: temperature intolerance or unexpected weight changes Breast: new or changing breast lumps or nipple discharge Resp: cough, shortness of breath, or wheezing CV: chest pain or dyspnea on exertion GI: as per HPI GU: dysuria, trouble voiding, or hematuria MSK: joint pain or joint  stiffness Neuro: TIA or stroke symptoms Derm: pruritus and skin lesion changes Psych: anxiety and depression  PE Blood pressure (!) 159/99, pulse 79, temperature 99.2 F (37.3 C), temperature source Oral, resp. rate 17, height 5\' 11"  (1.803 m), weight 78.9 kg, SpO2 99 %. Constitutional: NAD; conversant; no deformities Eyes: Moist conjunctiva; no lid lag; anicteric; PERRL Neck: Trachea midline; no thyromegaly Lungs: Normal respiratory effort; no tactile fremitus CV: RRR; no palpable thrills; no pitting edema GI: Abd soft, nt, nd; no palpable hepatosplenomegaly Rectal: dre deferred to pain; right posterior perirectal area reveals some fluctuance of about 3 cm x 2 cm, no cellulitis some induration.  There appears to be a sinus in the posterior midline consistent with perhaps a a potential fistula  MSK: Normal gait; no clubbing/cyanosis Psychiatric: Appropriate affect; alert and oriented x3 Lymphatic: No palpable cervical or axillary lymphadenopathy Skin:no rash/edema/ chronic healed sores on LE  Results for orders placed or performed during the hospital encounter of 08/13/20 (from the past 48 hour(s))  Comprehensive metabolic panel     Status: Abnormal   Collection Time: 08/13/20  2:31 PM  Result Value Ref Range   Sodium 128 (L) 135 - 145 mmol/L   Potassium 3.2 (L) 3.5 - 5.1 mmol/L   Chloride 91 (L) 98 - 111 mmol/L   CO2 27 22 - 32 mmol/L   Glucose, Bld 101 (H) 70 - 99 mg/dL    Comment: Glucose reference range applies only to samples taken after fasting for at least 8 hours.   BUN 10 6 - 20 mg/dL   Creatinine, Ser 08/15/20 0.61 - 1.24 mg/dL   Calcium 8.0 (L) 8.9 -  10.3 mg/dL   Total Protein 7.5 6.5 - 8.1 g/dL   Albumin 3.1 (L) 3.5 - 5.0 g/dL   AST 47 (H) 15 - 41 U/L   ALT 29 0 - 44 U/L   Alkaline Phosphatase 43 38 - 126 U/L   Total Bilirubin 0.4 0.3 - 1.2 mg/dL   GFR, Estimated >40>60 >98>60 mL/min    Comment: (NOTE) Calculated using the CKD-EPI Creatinine Equation (2021)    Anion gap  10 5 - 15    Comment: Performed at [facility] [place], [address] [place], [place] [address]  CBC with Differential     Status: Abnormal   Collection Time: 08/13/20  2:31 PM  Result Value Ref Range   WBC 9.0 4.0 - 10.5 K/uL   RBC 4.32 4.22 - 5.81 MIL/uL   Hemoglobin 12.9 (L) 13.0 - 17.0 g/dL   HCT 78.236.3 (L) 95.639.0 - 21.352.0 %   MCV 84.0 80.0 - 100.0 fL   MCH 29.9 26.0 - 34.0 pg   MCHC 35.5 30.0 - 36.0 g/dL   RDW 08.612.7 57.811.5 - 46.915.5 %   Platelets 255 150 - 400 K/uL   nRBC 0.0 0.0 - 0.2 %   Neutrophils Relative % 36 %   Neutro Abs 3.3 1.7 - 7.7 K/uL   Lymphocytes Relative 38 %   Lymphs Abs 3.5 0.7 - 4.0 K/uL   Monocytes Relative 24 %   Monocytes Absolute 2.1 (H) 0.1 - 1.0 K/uL   Eosinophils Relative 0 %   Eosinophils Absolute 0.0 0.0 - 0.5 K/uL   Basophils Relative 1 %   Basophils Absolute 0.1 0.0 - 0.1 K/uL   WBC Morphology Abnormal lymphocytes present    RBC Morphology MORPHOLOGY UNREMARKABLE    Immature Granulocytes 1 %   Abs Immature Granulocytes 0.08 (H) 0.00 - 0.07 K/uL   Giant PLTs PRESENT     Comment: Performed at [facility] [place], [address] [place], [place] [address]  Lipase, blood     Status: None   Collection Time: 08/13/20  2:31 PM  Result Value Ref Range   Lipase 50 11 - 51 U/L    Comment: Performed at [facility] [place], [address] [place], [place] [address]  Resp Panel by RT-PCR (Flu A&B, Covid) Nasopharyngeal Swab     Status: None   Collection Time: 08/13/20  2:31 PM   Specimen: Nasopharyngeal Swab; Nasopharyngeal(NP) swabs in vial transport medium  Result Value Ref Range   SARS Coronavirus 2 by RT PCR NEGATIVE NEGATIVE    Comment: (NOTE) SARS-CoV-2 target nucleic acids are NOT DETECTED.  The SARS-CoV-2 RNA is generally detectable in upper respiratory specimens during the acute phase of infection. The lowest concentration of SARS-CoV-2 viral copies this assay can detect is 138 copies/mL. A negative result does not preclude  SARS-Cov-2 infection and should not be used as the sole basis for treatment or other patient management decisions. A negative result may occur with  improper specimen collection/handling, submission of specimen other than nasopharyngeal swab, presence of viral mutation(s) within the areas targeted by this assay, and inadequate number of viral copies(<138 copies/mL). A negative result must be combined with clinical observations, patient history, and epidemiological information. The expected result is Negative.  Fact Sheet for Patients:  BloggerCourse.comhttps://www.fda.gov/media/152166/download  Fact Sheet for Healthcare Providers:  SeriousBroker.ithttps://www.fda.gov/media/152162/download  This test is no t yet approved or cleared by the [place] FDA and  has been authorized for detection and/or diagnosis of  SARS-CoV-2 by FDA under an Emergency Use Authorization (EUA). This EUA will remain  in effect (meaning this test can be used) for the duration of the COVID-19 declaration under Section 564(b)(1) of the Act, 21 U.S.C.section 360bbb-3(b)(1), unless the authorization is terminated  or revoked sooner.       Influenza A by PCR NEGATIVE NEGATIVE   Influenza B by PCR NEGATIVE NEGATIVE    Comment: (NOTE) The Xpert Xpress SARS-CoV-2/FLU/RSV plus assay is intended as an aid in the diagnosis of influenza from Nasopharyngeal swab specimens and should not be used as a sole basis for treatment. Nasal washings and aspirates are unacceptable for Xpert Xpress SARS-CoV-2/FLU/RSV testing.  Fact Sheet for Patients: BloggerCourse.com  Fact Sheet for Healthcare Providers: SeriousBroker.it  This test is not yet approved or cleared by the [place] FDA and has been authorized for detection and/or diagnosis of SARS-CoV-2 by FDA under an Emergency Use Authorization (EUA). This EUA will remain in effect (meaning this test can be used) for the duration of the COVID-19  declaration under Section 564(b)(1) of the Act, 21 U.S.C. section 360bbb-3(b)(1), unless the authorization is terminated or revoked.  Performed at [facility], [address] [place], [place] [address]   Urinalysis, Routine w reflex microscopic     Status: Abnormal   Collection Time: 08/13/20  3:25 PM  Result Value Ref Range   Color, Urine YELLOW YELLOW   APPearance CLEAR CLEAR   Specific Gravity, Urine <1.005 (L) 1.005 - 1.030   pH 6.0 5.0 - 8.0   Glucose, UA NEGATIVE NEGATIVE mg/dL   Hgb urine dipstick TRACE (A) NEGATIVE   Bilirubin Urine NEGATIVE NEGATIVE   Ketones, ur NEGATIVE NEGATIVE mg/dL   Protein, ur NEGATIVE NEGATIVE mg/dL   Nitrite NEGATIVE NEGATIVE   Leukocytes,Ua NEGATIVE NEGATIVE    Comment: Performed at [facility], [address] [place], [place] [address]  C Difficile Quick Screen w PCR reflex     Status: None   Collection Time: 08/13/20  3:25 PM   Specimen: Stool  Result Value Ref Range   C Diff antigen NEGATIVE NEGATIVE   C Diff toxin NEGATIVE NEGATIVE   C Diff interpretation No C. difficile detected.     Comment: Performed at [facility], [address] [place], [place] [address]  Urinalysis, Microscopic (reflex)     Status: Abnormal   Collection Time: 08/13/20  3:25 PM  Result Value Ref Range   RBC / HPF 0-5 0 - 5 RBC/hpf   WBC, UA NONE SEEN 0 - 5 WBC/hpf   Bacteria, UA RARE (A) NONE SEEN   Squamous Epithelial / LPF 0-5 0 - 5    Comment: Performed at [facility], [address] [place], [place] [address]    CT ABDOMEN PELVIS W CONTRAST  Result Date: 08/13/2020 CLINICAL DATA:  Rectal pain and diarrhea. EXAM: CT ABDOMEN AND PELVIS WITH CONTRAST TECHNIQUE: Multidetector CT imaging of the abdomen and pelvis was performed using the standard protocol following bolus administration of intravenous contrast. CONTRAST:  OMNIPAQUE IOHEXOL 300 MG/ML  SOLN COMPARISON:  None. FINDINGS: Lower chest: No acute  abnormality. Hepatobiliary: There is diffuse fatty infiltration of the liver parenchyma. A 1.4 cm x 1.1 cm focus of parenchymal low attenuation is seen within the anteromedial aspect of the right lobe of the liver. Additional subcentimeter foci of parenchymal low attenuation are seen within the posterior aspect of the right lobe and anterior aspect of  the left lobe. No gallstones, gallbladder wall thickening, or biliary dilatation. Pancreas: Unremarkable. No pancreatic ductal dilatation or surrounding inflammatory changes. Spleen: Normal in size without focal abnormality. Adrenals/Urinary Tract: The right adrenal gland is normal in size and appearance. A 1.0 cm x 1.0 cm low-attenuation left adrenal mass is seen. Kidneys are normal, without renal calculi, focal lesion, or hydronephrosis. Bladder is unremarkable. Stomach/Bowel: Stomach is within normal limits. Appendix appears normal. No evidence of bowel wall thickening, distention, or inflammatory changes. Vascular/Lymphatic: Aortic atherosclerosis. No enlarged abdominal or pelvic lymph nodes. Reproductive: The prostate gland is mildly enlarged Other: No abdominal wall hernia or abnormality. No abdominopelvic ascites. Musculoskeletal: No acute or significant osseous findings. IMPRESSION: 1. Findings consistent with a posterior perirectal abscess. 2. Fatty liver with suspected hepatic cysts versus hemangiomas. 3. Small left adrenal mass which may represent an adrenal adenoma. Electronically Signed   By: Aram Candela M.D.   On: 08/13/2020 15:26    Imaging: Personally reviewed  A/P: Vidyuth Belsito is an 50 y.o. male with  Posterior perirectal abscess Recent diarrhea Hyponatremia Hypokalemia Intermittent explosive disorder Affective psychosis History of alcohol abuse Tobacco use  Not sure if his recent diarrhea caused hemorrhoidal inflammation which then caused posterior perirectal abscess  Dr. Rubin Payor does not think the patient will tolerate  bedside I&D in the emergency room since he is requiring a fair amount of Dilaudid  It almost appears to have a horse shape abscess on CT  This will need to be surgically incised and drained in the operating room  We will tentatively try to do this tomorrow however our surgical schedule is already quite heavy  IV antibiotics nP.o. after midnight except meds Or daytime surgical team should know midmorning whether or not the surgical schedule allow the patient to be done tomorrow afternoon    Mary Sella. Andrey Campanile, MD, FACS General, Bariatric, & Minimally Invasive Surgery [facility], [place]

## 2020-08-13 NOTE — ED Notes (Signed)
Patient transported to CT 

## 2020-08-13 NOTE — ED Notes (Signed)
Report called to [place] RN  at [employer].

## 2020-08-14 ENCOUNTER — Encounter (HOSPITAL_COMMUNITY)
Admission: EM | Disposition: A | Discharge status: Home / Self Care | Payer: Self-pay | Source: Home / Self Care | Attending: Internal Medicine

## 2020-08-14 ENCOUNTER — Inpatient Hospital Stay (HOSPITAL_COMMUNITY): Payer: Self-pay | Admitting: Anesthesiology

## 2020-08-14 ENCOUNTER — Encounter (HOSPITAL_COMMUNITY): Payer: Self-pay | Admitting: Internal Medicine

## 2020-08-14 ENCOUNTER — Other Ambulatory Visit: Payer: Self-pay

## 2020-08-14 DIAGNOSIS — K611 Rectal abscess: Principal | ICD-10-CM

## 2020-08-14 HISTORY — PX: IRRIGATION AND DEBRIDEMENT ABSCESS: SHX5252

## 2020-08-14 LAB — BASIC METABOLIC PANEL
Anion gap: 9 (ref 5–15)
BUN: 9 mg/dL (ref 6–20)
CO2: 25 mmol/L (ref 22–32)
Calcium: 8.5 mg/dL — ABNORMAL LOW (ref 8.9–10.3)
Chloride: 97 mmol/L — ABNORMAL LOW (ref 98–111)
Creatinine, Ser: 0.9 mg/dL (ref 0.61–1.24)
GFR, Estimated: >60 mL/min (ref >60)
Glucose, Bld: 94 mg/dL (ref 70–99)
Potassium: 3.8 mmol/L (ref 3.5–5.1)
Sodium: 131 mmol/L — ABNORMAL LOW (ref 135–145)

## 2020-08-14 LAB — CBC
HCT: 37.4 % — ABNORMAL LOW (ref 39.0–52.0)
Hemoglobin: 12.5 g/dL — ABNORMAL LOW (ref 13.0–17.0)
MCH: 28.7 pg (ref 26.0–34.0)
MCHC: 33.4 g/dL (ref 30.0–36.0)
MCV: 86 fL (ref 80.0–100.0)
Platelets: 236 10*3/uL (ref 150–400)
RBC: 4.35 MIL/uL (ref 4.22–5.81)
RDW: 13.1 % (ref 11.5–15.5)
WBC: 9.3 10*3/uL (ref 4.0–10.5)
nRBC: 0 % (ref 0.0–0.2)

## 2020-08-14 LAB — GASTROINTESTINAL PANEL BY PCR, STOOL (REPLACES STOOL CULTURE)

## 2020-08-14 LAB — HIV ANTIBODY (ROUTINE TESTING W REFLEX): HIV Screen 4th Generation wRfx: NONREACTIVE

## 2020-08-14 LAB — MAGNESIUM: Magnesium: 1.9 mg/dL (ref 1.7–2.4)

## 2020-08-14 LAB — RAPID URINE DRUG SCREEN, HOSP PERFORMED
Amphetamines: NOT DETECTED
Barbiturates: NOT DETECTED
Benzodiazepines: NOT DETECTED
Cocaine: NOT DETECTED
Opiates: POSITIVE — AB
Tetrahydrocannabinol: NOT DETECTED

## 2020-08-14 SURGERY — IRRIGATION AND DEBRIDEMENT ABSCESS
Anesthesia: General

## 2020-08-14 MED ORDER — FENTANYL CITRATE (PF) 100 MCG/2ML IJ SOLN
INTRAMUSCULAR | Status: AC
  Filled 2020-08-14: qty 2

## 2020-08-14 MED ORDER — LIDOCAINE 2% (20 MG/ML) 5 ML SYRINGE
INTRAMUSCULAR | Status: AC
  Filled 2020-08-14: qty 5

## 2020-08-14 MED ORDER — FENTANYL CITRATE (PF) 250 MCG/5ML IJ SOLN
INTRAMUSCULAR | Status: DC | PRN
  Administered 2020-08-14: 25 ug via INTRAVENOUS
  Administered 2020-08-14: 50 ug via INTRAVENOUS
  Administered 2020-08-14: 25 ug via INTRAVENOUS

## 2020-08-14 MED ORDER — SUCCINYLCHOLINE CHLORIDE 200 MG/10ML IV SOSY
PREFILLED_SYRINGE | INTRAVENOUS | Status: AC
  Filled 2020-08-14: qty 10

## 2020-08-14 MED ORDER — MIDAZOLAM HCL 5 MG/5ML IJ SOLN
INTRAMUSCULAR | Status: DC | PRN
  Administered 2020-08-14: 2 mg via INTRAVENOUS

## 2020-08-14 MED ORDER — 0.9 % SODIUM CHLORIDE (POUR BTL) OPTIME
TOPICAL | Status: DC | PRN
  Administered 2020-08-14: 1000 mL

## 2020-08-14 MED ORDER — ROCURONIUM BROMIDE 10 MG/ML (PF) SYRINGE
PREFILLED_SYRINGE | INTRAVENOUS | Status: AC
  Filled 2020-08-14: qty 10

## 2020-08-14 MED ORDER — LIDOCAINE 2% (20 MG/ML) 5 ML SYRINGE
INTRAMUSCULAR | Status: DC | PRN
  Administered 2020-08-14: 80 mg via INTRAVENOUS

## 2020-08-14 MED ORDER — BUPIVACAINE-EPINEPHRINE (PF) 0.25% -1:200000 IJ SOLN
INTRAMUSCULAR | Status: AC
  Filled 2020-08-14: qty 30

## 2020-08-14 MED ORDER — LACTATED RINGERS IV SOLN: INTRAVENOUS | Status: DC

## 2020-08-14 MED ORDER — POTASSIUM CHLORIDE IN NACL 40-0.9 MEQ/L-% IV SOLN
INTRAVENOUS | Status: AC
  Filled 2020-08-14: qty 1000

## 2020-08-14 MED ORDER — CHLORHEXIDINE GLUCONATE 0.12 % MT SOLN
15.0000 mL | OROMUCOSAL | Status: AC
  Administered 2020-08-14: 15 mL via OROMUCOSAL

## 2020-08-14 MED ORDER — DEXAMETHASONE SODIUM PHOSPHATE 10 MG/ML IJ SOLN
INTRAMUSCULAR | Status: DC | PRN
  Administered 2020-08-14: 5 mg via INTRAVENOUS

## 2020-08-14 MED ORDER — ONDANSETRON HCL 4 MG/2ML IJ SOLN
INTRAMUSCULAR | Status: DC | PRN
  Administered 2020-08-14: 4 mg via INTRAVENOUS

## 2020-08-14 MED ORDER — PROPOFOL 10 MG/ML IV BOLUS
INTRAVENOUS | Status: DC | PRN
  Administered 2020-08-14: 200 mg via INTRAVENOUS

## 2020-08-14 MED ORDER — OXYCODONE HCL 5 MG PO TABS
5.0000 mg | ORAL_TABLET | ORAL | Status: DC | PRN
  Administered 2020-08-15 (×5): 5 mg via ORAL
  Filled 2020-08-14 (×5): qty 1

## 2020-08-14 MED ORDER — OXYCODONE HCL 5 MG/5ML PO SOLN: 5.0000 mg | Freq: Once | ORAL | Status: DC | PRN

## 2020-08-14 MED ORDER — ONDANSETRON HCL 4 MG/2ML IJ SOLN
INTRAMUSCULAR | Status: AC
  Filled 2020-08-14: qty 2

## 2020-08-14 MED ORDER — FENTANYL CITRATE (PF) 100 MCG/2ML IJ SOLN: 25.0000 ug | INTRAMUSCULAR | Status: DC | PRN

## 2020-08-14 MED ORDER — PROPOFOL 10 MG/ML IV BOLUS
INTRAVENOUS | Status: AC
  Filled 2020-08-14: qty 20

## 2020-08-14 MED ORDER — KETOROLAC TROMETHAMINE 30 MG/ML IJ SOLN: 30.0000 mg | Freq: Once | INTRAMUSCULAR | Status: DC | PRN

## 2020-08-14 MED ORDER — DEXAMETHASONE SODIUM PHOSPHATE 10 MG/ML IJ SOLN
INTRAMUSCULAR | Status: AC
  Filled 2020-08-14: qty 1

## 2020-08-14 MED ORDER — MIDAZOLAM HCL 2 MG/2ML IJ SOLN
INTRAMUSCULAR | Status: AC
  Filled 2020-08-14: qty 2

## 2020-08-14 MED ORDER — BUPIVACAINE-EPINEPHRINE 0.25% -1:200000 IJ SOLN
INTRAMUSCULAR | Status: DC | PRN
  Administered 2020-08-14: 10 mL

## 2020-08-14 MED ORDER — OXYCODONE HCL 5 MG PO TABS: 5.0000 mg | ORAL_TABLET | Freq: Once | ORAL | Status: DC | PRN

## 2020-08-14 MED ORDER — ONDANSETRON HCL 4 MG/2ML IJ SOLN: 4.0000 mg | Freq: Once | INTRAMUSCULAR | Status: DC | PRN

## 2020-08-14 SURGICAL SUPPLY — 42 items
APL SKNCLS STERI-STRIP NONHPOA (GAUZE/BANDAGES/DRESSINGS) ×1
BAG COUNTER SPONGE SURGICOUNT (BAG) IMPLANT
BAG SPNG CNTER NS LX DISP (BAG)
BAG SURGICOUNT SPONGE COUNTING (BAG)
BENZOIN TINCTURE PRP APPL 2/3 (GAUZE/BANDAGES/DRESSINGS) ×3 IMPLANT
BLADE HEX COATED 2.75 (ELECTRODE) ×3 IMPLANT
BLADE SURG 15 STRL LF DISP TIS (BLADE) ×1 IMPLANT
BLADE SURG 15 STRL SS (BLADE) ×3
BLADE SURG SZ10 CARB STEEL (BLADE) ×3 IMPLANT
CLOSURE WOUND 1/2 X4 (GAUZE/BANDAGES/DRESSINGS) ×1
COVER SURGICAL LIGHT HANDLE (MISCELLANEOUS) ×3 IMPLANT
DECANTER SPIKE VIAL GLASS SM (MISCELLANEOUS) ×3 IMPLANT
DRAPE LAPAROTOMY TRNSV 102X78 (DRAPES) ×3 IMPLANT
DRSG PAD ABDOMINAL 8X10 ST (GAUZE/BANDAGES/DRESSINGS) ×2 IMPLANT
ELECT PENCIL ROCKER SW 15FT (MISCELLANEOUS) ×2 IMPLANT
ELECT REM PT RETURN 15FT ADLT (MISCELLANEOUS) ×3 IMPLANT
GAUZE 4X4 16PLY ~~LOC~~+RFID DBL (SPONGE) ×3 IMPLANT
GAUZE PACKING IODOFORM 1/2 (PACKING) ×2 IMPLANT
GAUZE SPONGE 4X4 12PLY STRL (GAUZE/BANDAGES/DRESSINGS) ×3 IMPLANT
GLOVE SURG ENC TEXT LTX SZ8 (GLOVE) ×3 IMPLANT
GLOVE SURG UNDER POLY LF SZ7 (GLOVE) ×3 IMPLANT
GOWN SPEC L4 XLG W/TWL (GOWN DISPOSABLE) ×3 IMPLANT
GOWN STRL REUS W/TWL LRG LVL3 (GOWN DISPOSABLE) ×3 IMPLANT
GOWN STRL REUS W/TWL XL LVL3 (GOWN DISPOSABLE) ×9 IMPLANT
KIT BASIN OR (CUSTOM PROCEDURE TRAY) ×3 IMPLANT
KIT TURNOVER KIT A (KITS) ×3 IMPLANT
LEGGING LITHOTOMY PAIR STRL (DRAPES) ×2 IMPLANT
MARKER SKIN DUAL TIP RULER LAB (MISCELLANEOUS) ×3 IMPLANT
NDL HYPO 25X1 1.5 SAFETY (NEEDLE) ×1 IMPLANT
NEEDLE HYPO 22GX1.5 SAFETY (NEEDLE) ×3 IMPLANT
NEEDLE HYPO 25X1 1.5 SAFETY (NEEDLE) ×3 IMPLANT
NS IRRIG 1000ML POUR BTL (IV SOLUTION) ×3 IMPLANT
PACK BASIC VI WITH GOWN DISP (CUSTOM PROCEDURE TRAY) ×3 IMPLANT
PENCIL SMOKE EVACUATOR (MISCELLANEOUS) IMPLANT
SOL PREP POV-IOD 4OZ 10% (MISCELLANEOUS) ×3 IMPLANT
STRIP CLOSURE SKIN 1/2X4 (GAUZE/BANDAGES/DRESSINGS) ×2 IMPLANT
SUT VIC AB 4-0 SH 18 (SUTURE) ×3 IMPLANT
SYR BULB IRRIG 60ML STRL (SYRINGE) ×2 IMPLANT
SYR CONTROL 10ML LL (SYRINGE) ×3 IMPLANT
TOWEL OR 17X26 10 PK STRL BLUE (TOWEL DISPOSABLE) ×2 IMPLANT
WATER STERILE IRR 1000ML POUR (IV SOLUTION) ×3 IMPLANT
YANKAUER SUCT BULB TIP 10FT TU (MISCELLANEOUS) ×2 IMPLANT

## 2020-08-14 NOTE — H&P (View-Only) (Signed)
Day of Surgery  Subjective: CC: Reports continued rectal pain. No drainage.   Objective: Vital signs in last 24 hours: Temp:  [98.8 F (37.1 C)-101 F (38.3 C)] 99 F (37.2 C) (06/28 0545) Pulse Rate:  [70-90] 80 (06/28 0545) Resp:  [14-20] 18 (06/28 0545) BP: (119-167)/(70-99) 127/70 (06/28 0545) SpO2:  [94 %-100 %] 94 % (06/28 0545) Weight:  [78.9 kg] 78.9 kg (06/27 1328) Last BM Date: 08/13/20  Intake/Output from previous day: 06/27 0701 - 06/28 0700 In: 1762.6 [I.V.:307.2; IV Piggyback:1455.4] Out: -  Intake/Output this shift: No intake/output data recorded.  PE: Rectal: DRE not performed. Right posterior perirectal area reveals some fluctuance of about 3 cm x 2 cm without erythema/heat but with induration. No drainage.   Lab Results:  Recent Labs    08/13/20 1431 08/14/20 0259  WBC 9.0 9.3  HGB 12.9* 12.5*  HCT 36.3* 37.4*  PLT 255 236   BMET Recent Labs    08/13/20 1431 08/14/20 0259  NA 128* 131*  K 3.2* 3.8  CL 91* 97*  CO2 27 25  GLUCOSE 101* 94  BUN 10 9  CREATININE 0.83 0.90  CALCIUM 8.0* 8.5*   PT/INR No results for input(s): LABPROT, INR in the last 72 hours. CMP     Component Value Date/Time   NA 131 (L) 08/14/2020 0259   K 3.8 08/14/2020 0259   CL 97 (L) 08/14/2020 0259   CO2 25 08/14/2020 0259   GLUCOSE 94 08/14/2020 0259   BUN 9 08/14/2020 0259   CREATININE 0.90 08/14/2020 0259   CALCIUM 8.5 (L) 08/14/2020 0259   PROT 7.5 08/13/2020 1431   ALBUMIN 3.1 (L) 08/13/2020 1431   AST 47 (H) 08/13/2020 1431   ALT 29 08/13/2020 1431   ALKPHOS 43 08/13/2020 1431   BILITOT 0.4 08/13/2020 1431   GFRNONAA >60 08/14/2020 0259   GFRAA >90 11/30/2013 2120   Lipase     Component Value Date/Time   LIPASE 50 08/13/2020 1431       Studies/Results: CT ABDOMEN PELVIS W CONTRAST  Result Date: 08/13/2020 CLINICAL DATA:  Rectal pain and diarrhea. EXAM: CT ABDOMEN AND PELVIS WITH CONTRAST TECHNIQUE: Multidetector CT imaging of the  abdomen and pelvis was performed using the standard protocol following bolus administration of intravenous contrast. CONTRAST:  OMNIPAQUE IOHEXOL 300 MG/ML  SOLN COMPARISON:  None. FINDINGS: Lower chest: No acute abnormality. Hepatobiliary: There is diffuse fatty infiltration of the liver parenchyma. A 1.4 cm x 1.1 cm focus of parenchymal low attenuation is seen within the anteromedial aspect of the right lobe of the liver. Additional subcentimeter foci of parenchymal low attenuation are seen within the posterior aspect of the right lobe and anterior aspect of the left lobe. No gallstones, gallbladder wall thickening, or biliary dilatation. Pancreas: Unremarkable. No pancreatic ductal dilatation or surrounding inflammatory changes. Spleen: Normal in size without focal abnormality. Adrenals/Urinary Tract: The right adrenal gland is normal in size and appearance. A 1.0 cm x 1.0 cm low-attenuation left adrenal mass is seen. Kidneys are normal, without renal calculi, focal lesion, or hydronephrosis. Bladder is unremarkable. Stomach/Bowel: Stomach is within normal limits. Appendix appears normal. No evidence of bowel wall thickening, distention, or inflammatory changes. Vascular/Lymphatic: Aortic atherosclerosis. No enlarged abdominal or pelvic lymph nodes. Reproductive: The prostate gland is mildly enlarged Other: No abdominal wall hernia or abnormality. No abdominopelvic ascites. Musculoskeletal: No acute or significant osseous findings. IMPRESSION: 1. Findings consistent with a posterior perirectal abscess. 2. Fatty liver with suspected hepatic  cysts versus hemangiomas. 3. Small left adrenal mass which may represent an adrenal adenoma. Electronically Signed   By: Aram Candela M.D.   On: 08/13/2020 15:26    Anti-infectives: Anti-infectives (From admission, onward)    Start     Dose/Rate Route Frequency Ordered Stop   08/14/20 0000  piperacillin-tazobactam (ZOSYN) IVPB 3.375 g        3.375 g 12.5  mL/hr over 240 Minutes Intravenous Every 8 hours 08/13/20 2301          Assessment/Plan Peri-rectal abscess -Plan for EUA and I&D in the OR with Dr. Daphine Deutscher today. Dr. Andrey Campanile had concern about a horseshoes abscess given on exam his fluctuance is on the right but the abscess appears more left lateral on CT.  Discussed procedure, indication and risks with the patient.  Risks include but are not limited to general anesthesia (MI, CVA, death), bleeding, infection, scarring, injury to surrounding structures, need for additional surgeries and wound problems.  Patient in agreement with plan and wishes to proceed.  FEN - NPO, IVF per TRH VTE - SCDs, okay for chemical prophylaxis from general surgery standpoint ID - Zosyn   LOS: 1 day    Jacinto Halim , [facility] 08/14/2020, 10:35 AM Please see Amion for pager number during day hours 7:00am-4:30pm

## 2020-08-14 NOTE — Anesthesia Postprocedure Evaluation (Signed)
Anesthesia Post Note  Patient: Mark Thornton  Procedure(s) Performed: IEXAM UNDER ANES. RRIGATION AND DEBRIDEMENT PERIRECTAL ABSCESS     Patient location during evaluation: PACU Anesthesia Type: General Level of consciousness: awake and alert Pain management: pain level controlled Vital Signs Assessment: post-procedure vital signs reviewed and stable Respiratory status: spontaneous breathing, nonlabored ventilation, respiratory function stable and patient connected to nasal cannula oxygen Cardiovascular status: blood pressure returned to baseline and stable Postop Assessment: no apparent nausea or vomiting Anesthetic complications: no   No notable events documented.  Last Vitals:  Vitals:   08/14/20 1746 08/14/20 1954  BP: (!) 165/112 (!) 140/94  Pulse: 75 76  Resp: 18 18  Temp: 37.1 C 36.6 C  SpO2: 98% 100%    Last Pain:  Vitals:   08/14/20 1954  TempSrc: Oral  PainSc:                  Rosan Calbert S

## 2020-08-14 NOTE — Progress Notes (Signed)
Pharmacy Antibiotic Note  Mark Thornton is a 50 y.o. male admitted on 08/13/2020 with perirectal pain  Pharmacy has been consulted for zosyn dosing.  Plan: Zosyn 3.375g IV Q8H infused over 4hrs. Pharmacy will sign off and follow peripherally  Height: 5\' 11"  (180.3 cm) Weight: 78.9 kg (174 lb) IBW/kg (Calculated) : 75.3  Temp (24hrs), Avg:99.9 F (37.7 C), Min:99.2 F (37.3 C), Max:101 F (38.3 C)  Recent Labs  Lab 08/13/20 1431  WBC 9.0  CREATININE 0.83    Estimated Creatinine Clearance: 113.4 mL/min (by C-G formula based on SCr of 0.83 mg/dL).    No Known Allergies   Thank you for allowing pharmacy to be a part of this patient's care.  08/15/20 RPh 08/14/2020, 12:38 AM

## 2020-08-14 NOTE — Plan of Care (Signed)

## 2020-08-14 NOTE — Progress Notes (Signed)
PROGRESS NOTE    Mark Thornton  CVE:[id number] DOB: September 18, 1970 DOA: 08/13/2020 PCP: Patient, No Pcp Per (Inactive)   Chief Complain: Rectal pain, diarrhea  Brief Narrative: Patient is a 50 year old male with history of polysubstance abuse with alcohol, cocaine, tobacco, psychosis who presented to the emergency department with complaints of rectal pain.  Reported 2-week history of rectal pain with purulent discharge along with rectal bleeding.  No history of fever, chills.  Patient was started on Zosyn, blood cultures sent.  CT abdomen/pelvis showed posterior perirectal abscess.  General surgery consulted, plan for surgical management.  Assessment & Plan:   Principal Problem:   Perirectal abscess Active Problems:   Alcohol abuse   Hyponatremia   Hypokalemia   Perirectal abscess: Presented with rectal pain, rectal bleeding, purulent discharge.  Continue Zosyn.  Follow-up blood cultures.  General surgery planning for surgical management.  Continue pain management  Hyponatremia/hypokalemia: Continue to monitor  History of psychosis: Currently mood is stable.  Not taking any medications.  History of chronic alcohol abuse: Not in withdrawal.  Continue to monitor.  Consult for cessation/limitation of alcohol use  Adrenal mass: CT abdomen also showed small left adrenal mass which might represent adrenal adenoma.  We recommend to monitor as an outpatient.         DVT prophylaxis:SCD Code Status: Full Family Communication: None at the bedside Status is: Inpatient  Remains inpatient appropriate because:Inpatient level of care appropriate due to severity of illness  Dispo: The patient is from: Home              Anticipated d/c is to: Home              Patient currently is not medically stable to d/c.   Difficult to place patient No     Consultants: Cardiology  Procedures: None yet  Antimicrobials:  Anti-infectives (From admission, onward)    Start     Dose/Rate  Route Frequency Ordered Stop   08/14/20 0000  piperacillin-tazobactam (ZOSYN) IVPB 3.375 g        3.375 g 12.5 mL/hr over 240 Minutes Intravenous Every 8 hours 08/13/20 2301         Subjective:  Patient seen and examined the bedside this morning.  Hemodynamically stable.  He was complaining of rectal pain.  There is no obvious discharge or bleeding from around the perianal area.  Objective: Vitals:   08/13/20 2100 08/13/20 2235 08/14/20 0149 08/14/20 0545  BP: (!) 159/99 (!) 167/95 (!) 149/81 127/70  Pulse: 79 90 86 80  Resp: 17 20 17 18   Temp:  99.4 F (37.4 C) 98.8 F (37.1 C) 99 F (37.2 C)  TempSrc:  Oral Oral Oral  SpO2: 99% 100% 100% 94%  Weight:      Height:        Intake/Output Summary (Last 24 hours) at 08/14/2020 08/16/2020 Last data filed at 08/14/2020 0600 Gross per 24 hour  Intake 1762.61 ml  Output --  Net 1762.61 ml   Filed Weights   08/13/20 1328  Weight: 78.9 kg    Examination:  General exam: Appears calm and comfortable ,Not in distress,average built HEENT:PERRL,Oral mucosa moist, Ear/Nose normal on gross exam Respiratory system: Bilateral equal air entry, normal vesicular breath sounds, no wheezes or crackles  Cardiovascular system: S1 & S2 heard, RRR. No JVD, murmurs, rubs, gallops or clicks. No pedal edema. Gastrointestinal system: Abdomen is nondistended, soft and nontender. No organomegaly or masses felt. Normal bowel sounds heard. Central nervous system: Alert and  oriented. No focal neurological deficits. Extremities: No edema, no clubbing ,no cyanosis Skin: No rashes, lesions or ulcers,no icterus ,no pallor GU: No obvious bleeding or discharge around perianal area    Data Reviewed: I have personally reviewed following labs and imaging studies  CBC: Recent Labs  Lab 08/13/20 1431 08/14/20 0259  WBC 9.0 9.3  NEUTROABS 3.3  --   HGB 12.9* 12.5*  HCT 36.3* 37.4*  MCV 84.0 86.0  PLT 255 236   Basic Metabolic Panel: Recent Labs  Lab  08/13/20 1431 08/13/20 2233 08/14/20 0259  NA 128*  --  131*  K 3.2*  --  3.8  CL 91*  --  97*  CO2 27  --  25  GLUCOSE 101*  --  94  BUN 10  --  9  CREATININE 0.83  --  0.90  CALCIUM 8.0*  --  8.5*  MG  --  2.0 1.9   GFR: Estimated Creatinine Clearance: 104.6 mL/min (by C-G formula based on SCr of 0.9 mg/dL). Liver Function Tests: Recent Labs  Lab 08/13/20 1431  AST 47*  ALT 29  ALKPHOS 43  BILITOT 0.4  PROT 7.5  ALBUMIN 3.1*   Recent Labs  Lab 08/13/20 1431  LIPASE 50   No results for input(s): AMMONIA in the last 168 hours. Coagulation Profile: No results for input(s): INR, PROTIME in the last 168 hours. Cardiac Enzymes: No results for input(s): CKTOTAL, CKMB, CKMBINDEX, TROPONINI in the last 168 hours. BNP (last 3 results) No results for input(s): PROBNP in the last 8760 hours. HbA1C: No results for input(s): HGBA1C in the last 72 hours. CBG: No results for input(s): GLUCAP in the last 168 hours. Lipid Profile: No results for input(s): CHOL, HDL, LDLCALC, TRIG, CHOLHDL, LDLDIRECT in the last 72 hours. Thyroid Function Tests: No results for input(s): TSH, T4TOTAL, FREET4, T3FREE, THYROIDAB in the last 72 hours. Anemia Panel: No results for input(s): VITAMINB12, FOLATE, FERRITIN, TIBC, IRON, RETICCTPCT in the last 72 hours. Sepsis Labs: No results for input(s): PROCALCITON, LATICACIDVEN in the last 168 hours.  Recent Results (from the past 240 hour(s))  Resp Panel by RT-PCR (Flu A&B, Covid) Nasopharyngeal Swab     Status: None   Collection Time: 08/04/20  8:49 AM   Specimen: Nasopharyngeal Swab; Nasopharyngeal(NP) swabs in vial transport medium  Result Value Ref Range Status   SARS Coronavirus 2 by RT PCR NEGATIVE NEGATIVE Final    Comment: (NOTE) SARS-CoV-2 target nucleic acids are NOT DETECTED.  The SARS-CoV-2 RNA is generally detectable in upper respiratory specimens during the acute phase of infection. The lowest concentration of SARS-CoV-2 viral  copies this assay can detect is 138 copies/mL. A negative result does not preclude SARS-Cov-2 infection and should not be used as the sole basis for treatment or other patient management decisions. A negative result may occur with  improper specimen collection/handling, submission of specimen other than nasopharyngeal swab, presence of viral mutation(s) within the areas targeted by this assay, and inadequate number of viral copies(<138 copies/mL). A negative result must be combined with clinical observations, patient history, and epidemiological information. The expected result is Negative.  Fact Sheet for Patients:  BloggerCourse.com  Fact Sheet for Healthcare Providers:  SeriousBroker.it  This test is no t yet approved or cleared by the [place] FDA and  has been authorized for detection and/or diagnosis of SARS-CoV-2 by FDA under an Emergency Use Authorization (EUA). This EUA will remain  in effect (meaning this test can be used) for the  duration of the COVID-19 declaration under Section 564(b)(1) of the Act, 21 U.S.C.section 360bbb-3(b)(1), unless the authorization is terminated  or revoked sooner.       Influenza A by PCR NEGATIVE NEGATIVE Final   Influenza B by PCR NEGATIVE NEGATIVE Final    Comment: (NOTE) The Xpert Xpress SARS-CoV-2/FLU/RSV plus assay is intended as an aid in the diagnosis of influenza from Nasopharyngeal swab specimens and should not be used as a sole basis for treatment. Nasal washings and aspirates are unacceptable for Xpert Xpress SARS-CoV-2/FLU/RSV testing.  Fact Sheet for Patients: BloggerCourse.com  Fact Sheet for Healthcare Providers: SeriousBroker.it  This test is not yet approved or cleared by the [place] FDA and has been authorized for detection and/or diagnosis of SARS-CoV-2 by FDA under an Emergency Use Authorization (EUA). This  EUA will remain in effect (meaning this test can be used) for the duration of the COVID-19 declaration under Section 564(b)(1) of the Act, 21 U.S.C. section 360bbb-3(b)(1), unless the authorization is terminated or revoked.  Performed at [facility], [address], [place], [place] [address]   Resp Panel by RT-PCR (Flu A&B, Covid) Nasopharyngeal Swab     Status: None   Collection Time: 08/13/20  2:31 PM   Specimen: Nasopharyngeal Swab; Nasopharyngeal(NP) swabs in vial transport medium  Result Value Ref Range Status   SARS Coronavirus 2 by RT PCR NEGATIVE NEGATIVE Final    Comment: (NOTE) SARS-CoV-2 target nucleic acids are NOT DETECTED.  The SARS-CoV-2 RNA is generally detectable in upper respiratory specimens during the acute phase of infection. The lowest concentration of SARS-CoV-2 viral copies this assay can detect is 138 copies/mL. A negative result does not preclude SARS-Cov-2 infection and should not be used as the sole basis for treatment or other patient management decisions. A negative result may occur with  improper specimen collection/handling, submission of specimen other than nasopharyngeal swab, presence of viral mutation(s) within the areas targeted by this assay, and inadequate number of viral copies(<138 copies/mL). A negative result must be combined with clinical observations, patient history, and epidemiological information. The expected result is Negative.  Fact Sheet for Patients:  BloggerCourse.com  Fact Sheet for Healthcare Providers:  SeriousBroker.it  This test is no t yet approved or cleared by the [place] FDA and  has been authorized for detection and/or diagnosis of SARS-CoV-2 by FDA under an Emergency Use Authorization (EUA). This EUA will remain  in effect (meaning this test can be used) for the duration of the COVID-19 declaration under Section 564(b)(1) of the Act, 21 U.S.C.section  360bbb-3(b)(1), unless the authorization is terminated  or revoked sooner.       Influenza A by PCR NEGATIVE NEGATIVE Final   Influenza B by PCR NEGATIVE NEGATIVE Final    Comment: (NOTE) The Xpert Xpress SARS-CoV-2/FLU/RSV plus assay is intended as an aid in the diagnosis of influenza from Nasopharyngeal swab specimens and should not be used as a sole basis for treatment. Nasal washings and aspirates are unacceptable for Xpert Xpress SARS-CoV-2/FLU/RSV testing.  Fact Sheet for Patients: BloggerCourse.com  Fact Sheet for Healthcare Providers: SeriousBroker.it  This test is not yet approved or cleared by the [place] FDA and has been authorized for detection and/or diagnosis of SARS-CoV-2 by FDA under an Emergency Use Authorization (EUA). This EUA will remain in effect (meaning this test can be used) for the duration of the COVID-19 declaration under Section 564(b)(1) of the Act, 21 U.S.C. section 360bbb-3(b)(1), unless the authorization is terminated or revoked.  Performed at [facility] [place], [address] [place], [place] [address]   C Difficile Quick Screen w PCR reflex     Status: None   Collection Time: 08/13/20  3:25 PM   Specimen: Stool  Result Value Ref Range Status   C Diff antigen NEGATIVE NEGATIVE Final   C Diff toxin NEGATIVE NEGATIVE Final   C Diff interpretation No C. difficile detected.  Final    Comment: Performed at [facility], [address] [place], [place] [address]  Blood culture (routine x 2)     Status: None (Preliminary result)   Collection Time: 08/13/20  3:35 PM   Specimen: BLOOD RIGHT WRIST  Result Value Ref Range Status   Specimen Description   Final    BLOOD RIGHT WRIST Performed at [facility] [place], [address] [place], [place] [address]    Special Requests   Final    BOTTLES DRAWN AEROBIC AND ANAEROBIC Blood Culture adequate volume Performed at [facility]  [place], [address] [place], [place] [address]    Culture   Final    NO GROWTH < 12 HOURS Performed at [facility], [address] [place], [place] [address]    Report Status PENDING  Incomplete  Blood culture (routine x 2)     Status: None (Preliminary result)   Collection Time: 08/13/20  3:36 PM   Specimen: BLOOD LEFT HAND  Result Value Ref Range Status   Specimen Description   Final    BLOOD LEFT HAND Performed at [facility] [place], [address] [place], [place] [address]    Special Requests   Final    BOTTLES DRAWN AEROBIC AND ANAEROBIC Blood Culture adequate volume Performed at [facility] [place], [address] [place], [place] [address]    Culture   Final    NO GROWTH < 12 HOURS Performed at [facility], [address] [place], [place] [address]    Report Status PENDING  Incomplete         Radiology Studies: CT ABDOMEN PELVIS W CONTRAST  Result Date: 08/13/2020 CLINICAL DATA:  Rectal pain and diarrhea. EXAM: CT ABDOMEN AND PELVIS WITH CONTRAST TECHNIQUE: Multidetector CT imaging of the abdomen and pelvis was performed using the standard protocol following bolus administration of intravenous contrast. CONTRAST:  100mL OMNIPAQUE IOHEXOL 300 MG/ML  SOLN COMPARISON:  None. FINDINGS: Lower chest: No acute abnormality. Hepatobiliary: There is diffuse fatty infiltration of the liver parenchyma. A 1.4 cm x 1.1 cm focus of parenchymal low attenuation is seen within the anteromedial aspect of the right lobe of the liver. Additional subcentimeter foci of parenchymal low attenuation are seen within the posterior aspect of the right lobe and anterior aspect of the left lobe. No gallstones, gallbladder wall thickening, or biliary dilatation. Pancreas: Unremarkable. No pancreatic ductal dilatation or surrounding inflammatory changes. Spleen: Normal in size without focal abnormality. Adrenals/Urinary Tract: The right adrenal gland is normal in size and  appearance. A 1.0 cm x 1.0 cm low-attenuation left adrenal mass is seen. Kidneys are normal, without renal calculi, focal lesion, or hydronephrosis. Bladder is unremarkable. Stomach/Bowel: Stomach is within normal limits. Appendix appears normal. No evidence of bowel wall thickening, distention, or inflammatory changes. Vascular/Lymphatic: Aortic atherosclerosis. No enlarged abdominal or pelvic lymph nodes. Reproductive: The prostate gland is mildly enlarged Other: No abdominal wall hernia or abnormality. No abdominopelvic ascites. Musculoskeletal: No acute or significant osseous findings.  IMPRESSION: 1. Findings consistent with a posterior perirectal abscess. 2. Fatty liver with suspected hepatic cysts versus hemangiomas. 3. Small left adrenal mass which may represent an adrenal adenoma. Electronically Signed   By: Aram Candela M.D.   On: 08/13/2020 15:26        Scheduled Meds:  potassium chloride  40 mEq Oral Once   Continuous Infusions:  0.9 % NaCl with KCl 40 mEq / L 100 mL/hr at 08/14/20 0053   piperacillin-tazobactam (ZOSYN)  IV 3.375 g (08/14/20 0055)     LOS: 1 day    Time spent: 35 mins.More than 50% of that time was spent in counseling and/or coordination of care.      Burnadette Pop, MD [facility] P6/28/2022, 8:03 AM

## 2020-08-14 NOTE — Progress Notes (Signed)
Report received from Quentin Cornwall RN/ED.  Pt received to room 1331 and ambulated to bed on own. Pt handbook at bedside and education initiated on use of call bell for needs/safety,required safety video show on pt's TV.

## 2020-08-14 NOTE — Transfer of Care (Signed)
Immediate Anesthesia Transfer of Care Note  Patient: Mark Thornton  Procedure(s) Performed: IEXAM UNDER ANES. RRIGATION AND DEBRIDEMENT PERIRECTAL ABSCESS  Patient Location: PACU  Anesthesia Type:General  Level of Consciousness: awake, alert , oriented and patient cooperative  Airway & Oxygen Therapy: Patient Spontanous Breathing and Patient connected to face mask oxygen  Post-op Assessment: Report given to RN and Post -op Vital signs reviewed and stable  Post vital signs: Reviewed and stable  Last Vitals:  Vitals Value Taken Time  BP 117/53 08/14/20 1646  Temp    Pulse 74 08/14/20 1649  Resp 14 08/14/20 1649  SpO2 98 % 08/14/20 1649  Vitals shown include unvalidated device data.  Last Pain:  Vitals:   08/14/20 1604  TempSrc: Oral  PainSc:          Complications: No notable events documented.

## 2020-08-14 NOTE — Anesthesia Preprocedure Evaluation (Addendum)
Anesthesia Evaluation    Reviewed: Allergy & Precautions, Patient's Chart, lab work & pertinent test results  Airway Mallampati: II  TM Distance: >3 FB Neck ROM: Full    Dental no notable dental hx.    Pulmonary Current Smoker and Patient abstained from smoking.,    Pulmonary exam normal breath sounds clear to auscultation       Cardiovascular negative cardio ROS Normal cardiovascular exam Rhythm:Regular Rate:Normal     Neuro/Psych PSYCHIATRIC DISORDERS Schizophrenia negative neurological ROS     GI/Hepatic negative GI ROS, (+)     substance abuse  alcohol use, cocaine use and IV drug use,   Endo/Other  negative endocrine ROS  Renal/GU negative Renal ROS  negative genitourinary   Musculoskeletal negative musculoskeletal ROS (+)   Abdominal   Peds  Hematology negative hematology ROS (+) hct 34.7   Anesthesia Other Findings Perirectal abscess  Reproductive/Obstetrics negative OB ROS                           Anesthesia Physical Anesthesia Plan  ASA: 3  Anesthesia Plan: General   Post-op Pain Management:    Induction: Intravenous  PONV Risk Score and Plan: 1 and Ondansetron, Dexamethasone, Midazolam and Treatment may vary due to age or medical condition  Airway Management Planned: LMA  Additional Equipment: None  Intra-op Plan:   Post-operative Plan: Extubation in OR  Informed Consent: I have reviewed the patients History and Physical, chart, labs and discussed the procedure including the risks, benefits and alternatives for the proposed anesthesia with the patient or authorized representative who has indicated his/her understanding and acceptance.     Dental advisory given  Plan Discussed with: CRNA and Surgeon  Anesthesia Plan Comments: (UDS: )       Anesthesia Quick Evaluation

## 2020-08-14 NOTE — Op Note (Signed)
Mylez Venable  04/23/1970   08/14/2020    PCP:  Patient, No Pcp Per (Inactive)   Surgeon: Wenda Low, MD, FACS  Asst:  none  Anes:  general  Preop Dx: Posterior perirectal abscess-partially drained Postop Dx: same  Procedure: I & D of perirectal abscess--R>L with packing Location Surgery: [facility] 1 Complications: none  EBL:   minimal cc  Drains: None but packing and mesh panties  Description of Procedure:  The patient was taken to OR 1 .  After anesthesia was administered and the patient was prepped  with betadine  and a timeout was performed.  EUA performed.  Patient admitted that he had spontaneous drainage of large amount purulence in bed upstairs.  Fluctuant area on the right at 8 oclock.  Opened and was connected to the left and anterior.  Irrigated and packed with iodophor.  10 cc lidocaine local.    The patient tolerated the procedure well and was taken to the PACU in stable condition.     Matt B. Daphine Deutscher, MD, [facility], [place] [phone]

## 2020-08-14 NOTE — Anesthesia Procedure Notes (Signed)
Procedure Name: Intubation Date/Time: 08/14/2020 4:18 PM Performed by: Ponciano Ort, CRNA Pre-anesthesia Checklist: Patient identified, Emergency Drugs available, Suction available and Patient being monitored Patient Re-evaluated:Patient Re-evaluated prior to induction Oxygen Delivery Method: Circle system utilized Preoxygenation: Pre-oxygenation with 100% oxygen Induction Type: IV induction LMA: LMA inserted LMA Size: 4.0 Number of attempts: 1 Placement Confirmation: positive ETCO2 and breath sounds checked- equal and bilateral Tube secured with: Tape Dental Injury: Teeth and Oropharynx as per pre-operative assessment

## 2020-08-14 NOTE — Interval H&P Note (Signed)
History and Physical Interval Note:  08/14/2020 3:43 PM  Mark Thornton  has presented today for surgery, with the diagnosis of PERIRECTAL ABSCESS.  The various methods of treatment have been discussed with the patient and family. After consideration of risks, benefits and other options for treatment, the patient has consented to  Procedure(s): IEXAM UNDER ANES. RRIGATION AND DEBRIDEMENT PERIRECTAL ABSCESS (N/A) as a surgical intervention.  The patient's history has been reviewed, patient examined, no change in status, stable for surgery.  I have reviewed the patient's chart and labs.  Questions were answered to the patient's satisfaction.     Anselm Aumiller B Wretha Laris   

## 2020-08-14 NOTE — Progress Notes (Signed)
Day of Surgery  Subjective: CC: Reports continued rectal pain. No drainage.   Objective: Vital signs in last 24 hours: Temp:  [98.8 F (37.1 C)-101 F (38.3 C)] 99 F (37.2 C) (06/28 0545) Pulse Rate:  [70-90] 80 (06/28 0545) Resp:  [14-20] 18 (06/28 0545) BP: (119-167)/(70-99) 127/70 (06/28 0545) SpO2:  [94 %-100 %] 94 % (06/28 0545) Weight:  [78.9 kg] 78.9 kg (06/27 1328) Last BM Date: 08/13/20  Intake/Output from previous day: 06/27 0701 - 06/28 0700 In: 1762.6 [I.V.:307.2; IV Piggyback:1455.4] Out: -  Intake/Output this shift: No intake/output data recorded.  PE: Rectal: DRE not performed. Right posterior perirectal area reveals some fluctuance of about 3 cm x 2 cm without erythema/heat but with induration. No drainage.   Lab Results:  Recent Labs    08/13/20 1431 08/14/20 0259  WBC 9.0 9.3  HGB 12.9* 12.5*  HCT 36.3* 37.4*  PLT 255 236   BMET Recent Labs    08/13/20 1431 08/14/20 0259  NA 128* 131*  K 3.2* 3.8  CL 91* 97*  CO2 27 25  GLUCOSE 101* 94  BUN 10 9  CREATININE 0.83 0.90  CALCIUM 8.0* 8.5*   PT/INR No results for input(s): LABPROT, INR in the last 72 hours. CMP     Component Value Date/Time   NA 131 (L) 08/14/2020 0259   K 3.8 08/14/2020 0259   CL 97 (L) 08/14/2020 0259   CO2 25 08/14/2020 0259   GLUCOSE 94 08/14/2020 0259   BUN 9 08/14/2020 0259   CREATININE 0.90 08/14/2020 0259   CALCIUM 8.5 (L) 08/14/2020 0259   PROT 7.5 08/13/2020 1431   ALBUMIN 3.1 (L) 08/13/2020 1431   AST 47 (H) 08/13/2020 1431   ALT 29 08/13/2020 1431   ALKPHOS 43 08/13/2020 1431   BILITOT 0.4 08/13/2020 1431   GFRNONAA >60 08/14/2020 0259   GFRAA >90 11/30/2013 2120   Lipase     Component Value Date/Time   LIPASE 50 08/13/2020 1431       Studies/Results: CT ABDOMEN PELVIS W CONTRAST  Result Date: 08/13/2020 CLINICAL DATA:  Rectal pain and diarrhea. EXAM: CT ABDOMEN AND PELVIS WITH CONTRAST TECHNIQUE: Multidetector CT imaging of the  abdomen and pelvis was performed using the standard protocol following bolus administration of intravenous contrast. CONTRAST:  OMNIPAQUE IOHEXOL 300 MG/ML  SOLN COMPARISON:  None. FINDINGS: Lower chest: No acute abnormality. Hepatobiliary: There is diffuse fatty infiltration of the liver parenchyma. A 1.4 cm x 1.1 cm focus of parenchymal low attenuation is seen within the anteromedial aspect of the right lobe of the liver. Additional subcentimeter foci of parenchymal low attenuation are seen within the posterior aspect of the right lobe and anterior aspect of the left lobe. No gallstones, gallbladder wall thickening, or biliary dilatation. Pancreas: Unremarkable. No pancreatic ductal dilatation or surrounding inflammatory changes. Spleen: Normal in size without focal abnormality. Adrenals/Urinary Tract: The right adrenal gland is normal in size and appearance. A 1.0 cm x 1.0 cm low-attenuation left adrenal mass is seen. Kidneys are normal, without renal calculi, focal lesion, or hydronephrosis. Bladder is unremarkable. Stomach/Bowel: Stomach is within normal limits. Appendix appears normal. No evidence of bowel wall thickening, distention, or inflammatory changes. Vascular/Lymphatic: Aortic atherosclerosis. No enlarged abdominal or pelvic lymph nodes. Reproductive: The prostate gland is mildly enlarged Other: No abdominal wall hernia or abnormality. No abdominopelvic ascites. Musculoskeletal: No acute or significant osseous findings. IMPRESSION: 1. Findings consistent with a posterior perirectal abscess. 2. Fatty liver with suspected hepatic  cysts versus hemangiomas. 3. Small left adrenal mass which may represent an adrenal adenoma. Electronically Signed   By: Thaddeus  Houston M.D.   On: 08/13/2020 15:26    Anti-infectives: Anti-infectives (From admission, onward)    Start     Dose/Rate Route Frequency Ordered Stop   08/14/20 0000  piperacillin-tazobactam (ZOSYN) IVPB 3.375 g        3.375 g 12.5  mL/hr over 240 Minutes Intravenous Every 8 hours 08/13/20 2301          Assessment/Plan Peri-rectal abscess -Plan for EUA and I&D in the OR with Dr. Martin today. Dr. Wilson had concern about a horseshoes abscess given on exam his fluctuance is on the right but the abscess appears more left lateral on CT.  Discussed procedure, indication and risks with the patient.  Risks include but are not limited to general anesthesia (MI, CVA, death), bleeding, infection, scarring, injury to surrounding structures, need for additional surgeries and wound problems.  Patient in agreement with plan and wishes to proceed.  FEN - NPO, IVF per TRH VTE - SCDs, okay for chemical prophylaxis from general surgery standpoint ID - Zosyn   LOS: 1 day    Sheronica Corey M Judas Mohammad , PA-C [facility] 08/14/2020, 10:35 AM Please see Amion for pager number during day hours 7:00am-4:30pm  

## 2020-08-14 NOTE — Interval H&P Note (Signed)
History and Physical Interval Note:  08/14/2020 3:43 PM  Mark Thornton  has presented today for surgery, with the diagnosis of PERIRECTAL ABSCESS.  The various methods of treatment have been discussed with the patient and family. After consideration of risks, benefits and other options for treatment, the patient has consented to  Procedure(s): IEXAM UNDER ANES. RRIGATION AND DEBRIDEMENT PERIRECTAL ABSCESS (N/A) as a surgical intervention.  The patient's history has been reviewed, patient examined, no change in status, stable for surgery.  I have reviewed the patient's chart and labs.  Questions were answered to the patient's satisfaction.     [name]

## 2020-08-15 ENCOUNTER — Encounter (HOSPITAL_COMMUNITY): Payer: Self-pay | Admitting: Surgery

## 2020-08-15 LAB — HEMOGLOBIN AND HEMATOCRIT, BLOOD
HCT: 34 % — ABNORMAL LOW (ref 39.0–52.0)
Hemoglobin: 11.5 g/dL — ABNORMAL LOW (ref 13.0–17.0)

## 2020-08-15 LAB — CBC WITH DIFFERENTIAL/PLATELET
Abs Immature Granulocytes: 0.04 10*3/uL (ref 0.00–0.07)
Basophils Absolute: 0 10*3/uL (ref 0.0–0.1)
Basophils Relative: 0 %
Eosinophils Absolute: 0 10*3/uL (ref 0.0–0.5)
Eosinophils Relative: 0 %
HCT: 36.6 % — ABNORMAL LOW (ref 39.0–52.0)
Hemoglobin: 12.2 g/dL — ABNORMAL LOW (ref 13.0–17.0)
Immature Granulocytes: 1 %
Lymphocytes Relative: 48 %
Lymphs Abs: 2.7 10*3/uL (ref 0.7–4.0)
MCH: 29.2 pg (ref 26.0–34.0)
MCHC: 33.3 g/dL (ref 30.0–36.0)
MCV: 87.6 fL (ref 80.0–100.0)
Monocytes Absolute: 0.5 10*3/uL (ref 0.1–1.0)
Monocytes Relative: 10 %
Neutro Abs: 2.2 10*3/uL (ref 1.7–7.7)
Neutrophils Relative %: 41 %
Platelets: 179 10*3/uL (ref 150–400)
RBC: 4.18 MIL/uL — ABNORMAL LOW (ref 4.22–5.81)
RDW: 13.2 % (ref 11.5–15.5)
WBC: 5.5 10*3/uL (ref 4.0–10.5)
nRBC: 0 % (ref 0.0–0.2)

## 2020-08-15 LAB — BASIC METABOLIC PANEL
Anion gap: 7 (ref 5–15)
BUN: 9 mg/dL (ref 6–20)
CO2: 28 mmol/L (ref 22–32)
Calcium: 8.7 mg/dL — ABNORMAL LOW (ref 8.9–10.3)
Chloride: 97 mmol/L — ABNORMAL LOW (ref 98–111)
Creatinine, Ser: 0.76 mg/dL (ref 0.61–1.24)
GFR, Estimated: >60 mL/min (ref >60)
Glucose, Bld: 152 mg/dL — ABNORMAL HIGH (ref 70–99)
Potassium: 4.2 mmol/L (ref 3.5–5.1)
Sodium: 132 mmol/L — ABNORMAL LOW (ref 135–145)

## 2020-08-15 MED ORDER — OXYCODONE HCL 5 MG PO TABS: 5.0000 mg | ORAL_TABLET | Freq: Four times a day (QID) | ORAL | 0 refills | Status: DC | PRN

## 2020-08-15 MED ORDER — AMOXICILLIN-POT CLAVULANATE 875-125 MG PO TABS: 1.0000 | ORAL_TABLET | Freq: Two times a day (BID) | ORAL | 0 refills | Status: DC

## 2020-08-15 MED ORDER — SILVER NITRATE-POT NITRATE 75-25 % EX MISC
1.0000 "application " | CUTANEOUS | Status: DC | PRN
  Filled 2020-08-15: qty 1

## 2020-08-15 MED ORDER — HYDROMORPHONE HCL 1 MG/ML IJ SOLN
0.5000 mg | INTRAMUSCULAR | Status: DC | PRN
  Administered 2020-08-15 – 2020-08-16 (×4): 0.5 mg via INTRAVENOUS
  Filled 2020-08-15 (×4): qty 0.5

## 2020-08-15 NOTE — Progress Notes (Addendum)
1 Day Post-Op  Subjective: CC: Pain is better controlled after I&D yesterday.  Objective: Vital signs in last 24 hours: Temp:  [97.5 F (36.4 C)-100.8 F (38.2 C)] 98 F (36.7 C) (06/29 1057) Pulse Rate:  [61-82] 61 (06/29 1057) Resp:  [14-20] 18 (06/29 1057) BP: (117-185)/(53-112) 138/87 (06/29 1057) SpO2:  [97 %-100 %] 99 % (06/29 1057) Weight:  [78.9 kg] 78.9 kg (06/28 1540) Last BM Date: 08/14/20  Intake/Output from previous day: 06/28 0701 - 06/29 0700 In: 1066.9 [P.O.:120; I.V.:891.9; IV Piggyback:55] Out: 301 [Urine:300; Blood:1] Intake/Output this shift: Total I/O In: 120 [P.O.:120] Out: -   PE: Rectal: Right perirectal area incision noted with packing.  Packing was removed.  No purulent drainage.  No periwound cellulitis.  No further fluctuance.  Lab Results:  Recent Labs    08/14/20 0259 08/15/20 0313  WBC 9.3 5.5  HGB 12.5* 12.2*  HCT 37.4* 36.6*  PLT 236 179   BMET Recent Labs    08/14/20 0259 08/15/20 0313  NA 131* 132*  K 3.8 4.2  CL 97* 97*  CO2 25 28  GLUCOSE 94 152*  BUN 9 9  CREATININE 0.90 0.76  CALCIUM 8.5* 8.7*   PT/INR No results for input(s): LABPROT, INR in the last 72 hours. CMP     Component Value Date/Time   NA 132 (L) 08/15/2020 0313   K 4.2 08/15/2020 0313   CL 97 (L) 08/15/2020 0313   CO2 28 08/15/2020 0313   GLUCOSE 152 (H) 08/15/2020 0313   BUN 9 08/15/2020 0313   CREATININE 0.76 08/15/2020 0313   CALCIUM 8.7 (L) 08/15/2020 0313   PROT 7.5 08/13/2020 1431   ALBUMIN 3.1 (L) 08/13/2020 1431   AST 47 (H) 08/13/2020 1431   ALT 29 08/13/2020 1431   ALKPHOS 43 08/13/2020 1431   BILITOT 0.4 08/13/2020 1431   GFRNONAA >60 08/15/2020 0313   GFRAA >90 11/30/2013 2120   Lipase     Component Value Date/Time   LIPASE 50 08/13/2020 1431       Studies/Results: CT ABDOMEN PELVIS W CONTRAST  Result Date: 08/13/2020 CLINICAL DATA:  Rectal pain and diarrhea. EXAM: CT ABDOMEN AND PELVIS WITH CONTRAST TECHNIQUE:  Multidetector CT imaging of the abdomen and pelvis was performed using the standard protocol following bolus administration of intravenous contrast. CONTRAST:  OMNIPAQUE IOHEXOL 300 MG/ML  SOLN COMPARISON:  None. FINDINGS: Lower chest: No acute abnormality. Hepatobiliary: There is diffuse fatty infiltration of the liver parenchyma. A 1.4 cm x 1.1 cm focus of parenchymal low attenuation is seen within the anteromedial aspect of the right lobe of the liver. Additional subcentimeter foci of parenchymal low attenuation are seen within the posterior aspect of the right lobe and anterior aspect of the left lobe. No gallstones, gallbladder wall thickening, or biliary dilatation. Pancreas: Unremarkable. No pancreatic ductal dilatation or surrounding inflammatory changes. Spleen: Normal in size without focal abnormality. Adrenals/Urinary Tract: The right adrenal gland is normal in size and appearance. A 1.0 cm x 1.0 cm low-attenuation left adrenal mass is seen. Kidneys are normal, without renal calculi, focal lesion, or hydronephrosis. Bladder is unremarkable. Stomach/Bowel: Stomach is within normal limits. Appendix appears normal. No evidence of bowel wall thickening, distention, or inflammatory changes. Vascular/Lymphatic: Aortic atherosclerosis. No enlarged abdominal or pelvic lymph nodes. Reproductive: The prostate gland is mildly enlarged Other: No abdominal wall hernia or abnormality. No abdominopelvic ascites. Musculoskeletal: No acute or significant osseous findings. IMPRESSION: 1. Findings consistent with a posterior perirectal abscess. 2.  Fatty liver with suspected hepatic cysts versus hemangiomas. 3. Small left adrenal mass which may represent an adrenal adenoma. Electronically Signed   By: Aram Candela M.D.   On: 08/13/2020 15:26    Anti-infectives: Anti-infectives (From admission, onward)    Start     Dose/Rate Route Frequency Ordered Stop   08/14/20 0000  piperacillin-tazobactam (ZOSYN) IVPB  3.375 g        3.375 g 12.5 mL/hr over 240 Minutes Intravenous Every 8 hours 08/13/20 2301          Assessment/Plan POD 1 s/p I&D of perirectal abscess - Dr. Daphine Deutscher - 08/14/20 - Packing removed - Start sitz baths  - Currently on zosyn would recommend 5d of Augmentin at discharge. Cx's pending - Will send in pain medication to pharmacy and arrange follow up - He is okay for d/c from our standpoint. Please call back if we can be of any further assistance  FEN - Reg VTE - SCDs, okay for chemical prophylaxis from a general surgery standpoint. Hgb stable.  ID -Zosyn.  WBC 5.5.   LOS: 2 days    Jacinto Halim , [facility] 08/15/2020, 11:36 AM Please see Amion for pager number during day hours 7:00am-4:30pm

## 2020-08-15 NOTE — Progress Notes (Signed)
Called by RN for bleeding from wound. Seen with Dr. Daphine Deutscher. Noted oozing from the wound. No pulsatile bleeding. Dr. Daphine Deutscher recommended keeping patient on his side and applying continued pressure to the area until hemostatic. Discussed with RN. Please notify us if any issues overnight. Otherwise, will plan to see again in the AM.

## 2020-08-15 NOTE — Progress Notes (Signed)
PROGRESS NOTE    Mark Thornton  YPP:[id number] DOB: Oct 21, 1970 DOA: 08/13/2020 PCP: Patient, No Pcp Per (Inactive)   Chief Complain: Rectal pain, diarrhea  Brief Narrative: Patient is a 50 year old male with history of polysubstance abuse with alcohol, cocaine, tobacco, psychosis who presented to the emergency department with complaints of rectal pain.  Reported 2-week history of rectal pain with purulent discharge along with rectal bleeding.  No history of fever, chills.  Patient was started on Zosyn, blood cultures sent.  CT abdomen/pelvis showed posterior perirectal abscess.  General surgery consulted, status post incision and drainage of perirectal abscess on 08/14/2020.  Assessment & Plan:   Principal Problem:   Perirectal abscess Active Problems:   Alcohol abuse   Hyponatremia   Hypokalemia   Perirectal abscess: Presented with rectal pain, rectal bleeding, purulent discharge.  On Zosyn.  Follow-up blood cultures, no growth till date. General surgery consulted, status post incision and drainage of peritoneal abscess on 08/14/2020.  Patient still complains of rectal bleeding and severe rectal pain holding the discharge.  Hyponatremia/hypokalemia: Stable.Continue to monitor  History of psychosis: Currently mood is stable.  Not taking any medications.  History of chronic alcohol abuse: Not in withdrawal.  Continue to monitor.  Consult for cessation/limitation of alcohol use  Adrenal mass: CT abdomen also showed 1 cm x 1 cm left adrenal mass which might represent adrenal adenoma.  We recommend to monitor as an outpatient.         DVT prophylaxis:SCD Code Status: Full Family Communication: None at the bedside Status is: Inpatient  Remains inpatient appropriate because:Inpatient level of care appropriate due to severity of illness  Dispo: The patient is from: Home              Anticipated d/c is to: Home              Patient currently is not medically stable to d/c.    Difficult to place patient No     Consultants: Cardiology  Procedures: None yet  Antimicrobials:  Anti-infectives (From admission, onward)    Start     Dose/Rate Route Frequency Ordered Stop   08/15/20 0000  amoxicillin-clavulanate (AUGMENTIN) 875-125 MG tablet        1 tablet Oral Every 12 hours 08/15/20 1142     08/14/20 0000  piperacillin-tazobactam (ZOSYN) IVPB 3.375 g        3.375 g 12.5 mL/hr over 240 Minutes Intravenous Every 8 hours 08/13/20 2301         Subjective:  Patient seen and examined the bedside this morning.  Hemodynamically stable.  Complains of persistent rectal pain even after I&D and complains of rectal bleeding with soakage of pads.  Objective: Vitals:   08/14/20 1954 08/15/20 0216 08/15/20 0636 08/15/20 1057  BP: (!) 140/94 (!) 155/102 (!) 153/96 138/87  Pulse: 76 69 63 61  Resp: 18 18 18 18   Temp: 97.8 F (36.6 C) (!) 97.5 F (36.4 C) 97.8 F (36.6 C) 98 F (36.7 C)  TempSrc: Oral Oral Oral Oral  SpO2: 100% 98% 100% 99%  Weight:      Height:        Intake/Output Summary (Last 24 hours) at 08/15/2020 1255 Last data filed at 08/15/2020 1014 Gross per 24 hour  Intake 1186.92 ml  Output 301 ml  Net 885.92 ml   Filed Weights   08/13/20 1328 08/14/20 1540  Weight: 78.9 kg 78.9 kg    Examination:  General exam: Not in distress,average built HEENT:PERRL,Oral mucosa  moist, Ear/Nose normal on gross exam Respiratory system: Bilateral equal air entry, normal vesicular breath sounds, no wheezes or crackles  Cardiovascular system: S1 & S2 heard, RRR. No JVD, murmurs, rubs, gallops or clicks. No pedal edema. Gastrointestinal system: Abdomen is nondistended, soft and nontender. No organomegaly or masses felt. Normal bowel sounds heard. Central nervous system: Alert and oriented. Extremities: No edema, no clubbing ,no cyanosis     Data Reviewed: I have personally reviewed following labs and imaging studies  CBC: Recent Labs  Lab  08/13/20 1431 08/14/20 0259 08/15/20 0313  WBC 9.0 9.3 5.5  NEUTROABS 3.3  --  2.2  HGB 12.9* 12.5* 12.2*  HCT 36.3* 37.4* 36.6*  MCV 84.0 86.0 87.6  PLT 255 236 179   Basic Metabolic Panel: Recent Labs  Lab 08/13/20 1431 08/13/20 2233 08/14/20 0259 08/15/20 0313  NA 128*  --  131* 132*  K 3.2*  --  3.8 4.2  CL 91*  --  97* 97*  CO2 27  --  25 28  GLUCOSE 101*  --  94 152*  BUN 10  --  9 9  CREATININE 0.83  --  0.90 0.76  CALCIUM 8.0*  --  8.5* 8.7*  MG  --  2.0 1.9  --    GFR: Estimated Creatinine Clearance: 117.7 mL/min (by C-G formula based on SCr of 0.76 mg/dL). Liver Function Tests: Recent Labs  Lab 08/13/20 1431  AST 47*  ALT 29  ALKPHOS 43  BILITOT 0.4  PROT 7.5  ALBUMIN 3.1*   Recent Labs  Lab 08/13/20 1431  LIPASE 50   No results for input(s): AMMONIA in the last 168 hours. Coagulation Profile: No results for input(s): INR, PROTIME in the last 168 hours. Cardiac Enzymes: No results for input(s): CKTOTAL, CKMB, CKMBINDEX, TROPONINI in the last 168 hours. BNP (last 3 results) No results for input(s): PROBNP in the last 8760 hours. HbA1C: No results for input(s): HGBA1C in the last 72 hours. CBG: No results for input(s): GLUCAP in the last 168 hours. Lipid Profile: No results for input(s): CHOL, HDL, LDLCALC, TRIG, CHOLHDL, LDLDIRECT in the last 72 hours. Thyroid Function Tests: No results for input(s): TSH, T4TOTAL, FREET4, T3FREE, THYROIDAB in the last 72 hours. Anemia Panel: No results for input(s): VITAMINB12, FOLATE, FERRITIN, TIBC, IRON, RETICCTPCT in the last 72 hours. Sepsis Labs: No results for input(s): PROCALCITON, LATICACIDVEN in the last 168 hours.  Recent Results (from the past 240 hour(s))  Resp Panel by RT-PCR (Flu A&B, Covid) Nasopharyngeal Swab     Status: None   Collection Time: 08/13/20  2:31 PM   Specimen: Nasopharyngeal Swab; Nasopharyngeal(NP) swabs in vial transport medium  Result Value Ref Range Status   SARS  Coronavirus 2 by RT PCR NEGATIVE NEGATIVE Final    Comment: (NOTE) SARS-CoV-2 target nucleic acids are NOT DETECTED.  The SARS-CoV-2 RNA is generally detectable in upper respiratory specimens during the acute phase of infection. The lowest concentration of SARS-CoV-2 viral copies this assay can detect is 138 copies/mL. A negative result does not preclude SARS-Cov-2 infection and should not be used as the sole basis for treatment or other patient management decisions. A negative result may occur with  improper specimen collection/handling, submission of specimen other than nasopharyngeal swab, presence of viral mutation(s) within the areas targeted by this assay, and inadequate number of viral copies(<138 copies/mL). A negative result must be combined with clinical observations, patient history, and epidemiological information. The expected result is Negative.  Fact Sheet for  Patients:  BloggerCourse.com  Fact Sheet for Healthcare Providers:  SeriousBroker.it  This test is no t yet approved or cleared by the [place] FDA and  has been authorized for detection and/or diagnosis of SARS-CoV-2 by FDA under an Emergency Use Authorization (EUA). This EUA will remain  in effect (meaning this test can be used) for the duration of the COVID-19 declaration under Section 564(b)(1) of the Act, 21 U.S.C.section 360bbb-3(b)(1), unless the authorization is terminated  or revoked sooner.       Influenza A by PCR NEGATIVE NEGATIVE Final   Influenza B by PCR NEGATIVE NEGATIVE Final    Comment: (NOTE) The Xpert Xpress SARS-CoV-2/FLU/RSV plus assay is intended as an aid in the diagnosis of influenza from Nasopharyngeal swab specimens and should not be used as a sole basis for treatment. Nasal washings and aspirates are unacceptable for Xpert Xpress SARS-CoV-2/FLU/RSV testing.  Fact Sheet for  Patients: BloggerCourse.com  Fact Sheet for Healthcare Providers: SeriousBroker.it  This test is not yet approved or cleared by the [place] FDA and has been authorized for detection and/or diagnosis of SARS-CoV-2 by FDA under an Emergency Use Authorization (EUA). This EUA will remain in effect (meaning this test can be used) for the duration of the COVID-19 declaration under Section 564(b)(1) of the Act, 21 U.S.C. section 360bbb-3(b)(1), unless the authorization is terminated or revoked.  Performed at [facility], [address] [place], [place] [address]   Gastrointestinal Panel by PCR , Stool     Status: None   Collection Time: 08/13/20  3:25 PM   Specimen: Stool  Result Value Ref Range Status   Campylobacter species NOT DETECTED NOT DETECTED Final   Plesimonas shigelloides NOT DETECTED NOT DETECTED Final   Salmonella species NOT DETECTED NOT DETECTED Final   Yersinia enterocolitica NOT DETECTED NOT DETECTED Final   Vibrio species NOT DETECTED NOT DETECTED Final   Vibrio cholerae NOT DETECTED NOT DETECTED Final   Enteroaggregative E coli (EAEC) NOT DETECTED NOT DETECTED Final   Enteropathogenic E coli (EPEC) NOT DETECTED NOT DETECTED Final   Enterotoxigenic E coli (ETEC) NOT DETECTED NOT DETECTED Final   Shiga like toxin producing E coli (STEC) NOT DETECTED NOT DETECTED Final   Shigella/Enteroinvasive E coli (EIEC) NOT DETECTED NOT DETECTED Final   Cryptosporidium NOT DETECTED NOT DETECTED Final   Cyclospora cayetanensis NOT DETECTED NOT DETECTED Final   Entamoeba histolytica NOT DETECTED NOT DETECTED Final   Giardia lamblia NOT DETECTED NOT DETECTED Final   Adenovirus F40/41 NOT DETECTED NOT DETECTED Final   Astrovirus NOT DETECTED NOT DETECTED Final   Norovirus GI/GII NOT DETECTED NOT DETECTED Final   Rotavirus A NOT DETECTED NOT DETECTED Final   Sapovirus (I, II, IV, and V) NOT DETECTED NOT DETECTED Final     Comment: Performed at [facility], [address] [place], [place] [address]  C Difficile Quick Screen w PCR reflex     Status: None   Collection Time: 08/13/20  3:25 PM   Specimen: Stool  Result Value Ref Range Status   C Diff antigen NEGATIVE NEGATIVE Final   C Diff toxin NEGATIVE NEGATIVE Final   C Diff interpretation No C. difficile detected.  Final    Comment: Performed at [facility], [address] [place], [place] [address]  Blood culture (routine x 2)     Status: None (Preliminary result)   Collection Time: 08/13/20  3:35 PM   Specimen: BLOOD RIGHT WRIST  Result Value  Ref Range Status   Specimen Description   Final    BLOOD RIGHT WRIST Performed at [facility], [address] [place], [place] [address]    Special Requests   Final    BOTTLES DRAWN AEROBIC AND ANAEROBIC Blood Culture adequate volume Performed at [facility], [address] [place], [place] [address]    Culture   Final    NO GROWTH 2 DAYS Performed at [facility], [address] [place], [place] [address]    Report Status PENDING  Incomplete  Blood culture (routine x 2)     Status: None (Preliminary result)   Collection Time: 08/13/20  3:36 PM   Specimen: BLOOD LEFT HAND  Result Value Ref Range Status   Specimen Description   Final    BLOOD LEFT HAND Performed at [facility], [address] [place], [place] [address]    Special Requests   Final    BOTTLES DRAWN AEROBIC AND ANAEROBIC Blood Culture adequate volume Performed at [facility], [address] [place], [place] [address]    Culture   Final    NO GROWTH 2 DAYS Performed at [facility], [address], [place], [place] [address]    Report Status PENDING  Incomplete         Radiology Studies: CT ABDOMEN PELVIS W CONTRAST  Result Date: 08/13/2020 CLINICAL DATA:  Rectal pain and diarrhea. EXAM: CT ABDOMEN AND PELVIS WITH CONTRAST TECHNIQUE:  Multidetector CT imaging of the abdomen and pelvis was performed using the standard protocol following bolus administration of intravenous contrast. CONTRAST:  OMNIPAQUE IOHEXOL 300 MG/ML  SOLN COMPARISON:  None. FINDINGS: Lower chest: No acute abnormality. Hepatobiliary: There is diffuse fatty infiltration of the liver parenchyma. A 1.4 cm x 1.1 cm focus of parenchymal low attenuation is seen within the anteromedial aspect of the right lobe of the liver. Additional subcentimeter foci of parenchymal low attenuation are seen within the posterior aspect of the right lobe and anterior aspect of the left lobe. No gallstones, gallbladder wall thickening, or biliary dilatation. Pancreas: Unremarkable. No pancreatic ductal dilatation or surrounding inflammatory changes. Spleen: Normal in size without focal abnormality. Adrenals/Urinary Tract: The right adrenal gland is normal in size and appearance. A 1.0 cm x 1.0 cm low-attenuation left adrenal mass is seen. Kidneys are normal, without renal calculi, focal lesion, or hydronephrosis. Bladder is unremarkable. Stomach/Bowel: Stomach is within normal limits. Appendix appears normal. No evidence of bowel wall thickening, distention, or inflammatory changes. Vascular/Lymphatic: Aortic atherosclerosis. No enlarged abdominal or pelvic lymph nodes. Reproductive: The prostate gland is mildly enlarged Other: No abdominal wall hernia or abnormality. No abdominopelvic ascites. Musculoskeletal: No acute or significant osseous findings. IMPRESSION: 1. Findings consistent with a posterior perirectal abscess. 2. Fatty liver with suspected hepatic cysts versus hemangiomas. 3. Small left adrenal mass which may represent an adrenal adenoma. Electronically Signed   By: Aram Candela M.D.   On: 08/13/2020 15:26        Scheduled Meds:  potassium chloride  40 mEq Oral Once   Continuous Infusions:  piperacillin-tazobactam (ZOSYN)  IV 3.375 g (08/15/20 0754)     LOS: 2 days     Time spent: 25 mins.More than 50% of that time was spent in counseling and/or coordination of care.      Burnadette Pop, MD [facility] P6/29/2022, 12:55 PM

## 2020-08-16 LAB — HEMOGLOBIN AND HEMATOCRIT, BLOOD
HCT: 30.3 % — ABNORMAL LOW (ref 39.0–52.0)
HCT: 30.7 % — ABNORMAL LOW (ref 39.0–52.0)
Hemoglobin: 10.2 g/dL — ABNORMAL LOW (ref 13.0–17.0)
Hemoglobin: 10.3 g/dL — ABNORMAL LOW (ref 13.0–17.0)

## 2020-08-16 NOTE — Progress Notes (Signed)
    2 Days Post-Op  Subjective: CC: Bleeding from wound has stopped. Hemostatic. No drainage this am.   Objective: Vital signs in last 24 hours: Temp:  [98 F (36.7 C)-98.7 F (37.1 C)] 98.7 F (37.1 C) (06/30 0509) Pulse Rate:  [61-86] 68 (06/30 0509) Resp:  [16-18] 16 (06/30 0509) BP: (127-152)/(79-93) 152/93 (06/30 0509) SpO2:  [95 %-100 %] 98 % (06/30 0509) Last BM Date: 08/14/20  Intake/Output from previous day: 06/29 0701 - 06/30 0700 In: 320 [P.O.:120; IV Piggyback:200] Out: -  Intake/Output this shift: No intake/output data recorded.  PE: Rectal: Right perirectal area incision noted and hemostatic.  No purulent drainage.  No periwound cellulitis.  No further fluctuance  Lab Results:  Recent Labs    08/14/20 0259 08/15/20 0313 08/15/20 1734 08/16/20 0639  WBC 9.3 5.5  --   --   HGB 12.5* 12.2* 11.5* 10.2*  HCT 37.4* 36.6* 34.0* 30.3*  PLT 236 179  --   --    BMET Recent Labs    08/14/20 0259 08/15/20 0313  NA 131* 132*  K 3.8 4.2  CL 97* 97*  CO2 25 28  GLUCOSE 94 152*  BUN 9 9  CREATININE 0.90 0.76  CALCIUM 8.5* 8.7*   PT/INR No results for input(s): LABPROT, INR in the last 72 hours. CMP     Component Value Date/Time   NA 132 (L) 08/15/2020 0313   K 4.2 08/15/2020 0313   CL 97 (L) 08/15/2020 0313   CO2 28 08/15/2020 0313   GLUCOSE 152 (H) 08/15/2020 0313   BUN 9 08/15/2020 0313   CREATININE 0.76 08/15/2020 0313   CALCIUM 8.7 (L) 08/15/2020 0313   PROT 7.5 08/13/2020 1431   ALBUMIN 3.1 (L) 08/13/2020 1431   AST 47 (H) 08/13/2020 1431   ALT 29 08/13/2020 1431   ALKPHOS 43 08/13/2020 1431   BILITOT 0.4 08/13/2020 1431   GFRNONAA >60 08/15/2020 0313   GFRAA >90 11/30/2013 2120   Lipase     Component Value Date/Time   LIPASE 50 08/13/2020 1431       Studies/Results: No results found.  Anti-infectives: Anti-infectives (From admission, onward)    Start     Dose/Rate Route Frequency Ordered Stop   08/15/20 0000   amoxicillin-clavulanate (AUGMENTIN) 875-125 MG tablet        1 tablet Oral Every 12 hours 08/15/20 1142     08/14/20 0000  piperacillin-tazobactam (ZOSYN) IVPB 3.375 g        3.375 g 12.5 mL/hr over 240 Minutes Intravenous Every 8 hours 08/13/20 2301          Assessment/Plan POD 2 s/p I&D of perirectal abscess - Dr. Daphine Deutscher - 08/14/20 - Packing removed POD 1. Do not need to repack - Bleeding noted yesterday has stopped. Wound is hemostatic.  - Cont sitz baths - Currently on zosyn would recommend 5d of Augmentin at discharge - Pain medication to pharmacy and arrange follow up - If repeat H/H is stable, he will be okay for d/c from our standpoint.    FEN - Reg VTE - SCDs ID - Zosyn   LOS: 3 days    Jacinto Halim , [facility] 08/16/2020, 9:53 AM Please see Amion for pager number during day hours 7:00am-4:30pm

## 2020-08-16 NOTE — Discharge Summary (Signed)
Physician Discharge Summary  Mark Thornton NFA:[id number] DOB: 01-11-1971 DOA: 08/13/2020  PCP: Patient, No Pcp Per (Inactive)  Admit date: 08/13/2020 Discharge date: 08/16/2020  Admitted From: Home Disposition:  Home  Discharge Condition:Stable CODE STATUS:FULL Diet recommendation: Heart Healthy   Brief/Interim Summary:  Patient is a 50 year old male with history of polysubstance abuse with alcohol, cocaine, tobacco, psychosis who presented to the emergency department with complaints of rectal pain.  Reported 2-week history of rectal pain with purulent discharge along with rectal bleeding.  No history of fever, chills.  Patient was started on Zosyn, blood cultures sent.  CT abdomen/pelvis showed posterior perirectal abscess.  General surgery consulted, status post incision and drainage of perirectal abscess on 08/14/2020.  Hospital course remarkable for oozing from the I&D site but currently stable.  Hemodynamically stable for discharge today with pain medications and oral antibiotics  Following problems were addressed during his hospitalization:  Perirectal abscess: Presented with rectal pain, rectal bleeding, purulent discharge.  On Zosyn.  Follow-up blood cultures, no growth till date. General surgery consulted, status post incision and drainage of peritoneal abscess on 08/14/2020.  Hospital course remarkable for  rectal bleeding and severe rectal pain holding the discharge.Now stable He will follow-up with general surgery as an outpatient.  Patient has been discharged on Augmentin and oxycodone.   Hyponatremia/hypokalemia: Stable   History of psychosis: Currently mood is stable.  Not taking any medications.   History of chronic alcohol abuse: Not in withdrawal.  Continue to monitor.  Counseled for cessation/limitation of alcohol use   Adrenal mass: CT abdomen also showed 1 cm x 1 cm left adrenal mass which might represent adrenal adenoma.  We recommend to monitor as an outpatient.         Discharge Diagnoses:  Principal Problem:   Perirectal abscess Active Problems:   Alcohol abuse   Hyponatremia   Hypokalemia    Discharge Instructions  Discharge Instructions     Diet - low sodium heart healthy   Complete by: As directed    Discharge instructions   Complete by: As directed    1)Please follow up general surgery as an outpatient   Increase activity slowly   Complete by: As directed    No wound care   Complete by: As directed       Allergies as of 08/16/2020   No Known Allergies      Medication List     STOP taking these medications    OLANZapine 10 MG tablet Commonly known as: ZyPREXA       TAKE these medications    acetaminophen 500 MG tablet Commonly known as: TYLENOL Take 1,000 mg by mouth every 6 (six) hours as needed for mild pain or headache.   amoxicillin-clavulanate 875-125 MG tablet Commonly known as: AUGMENTIN Take 1 tablet by mouth every 12 (twelve) hours.   multivitamin capsule Take 1 capsule by mouth daily.   oxyCODONE 5 MG immediate release tablet Commonly known as: Roxicodone Take 1 tablet (5 mg total) by mouth every 6 (six) hours as needed. What changed:  when to take this reasons to take this        Follow-up Information     [facility] Call.   Specialty: General Surgery Why: We are working hard to make this for you.  Please reach out to her office to confirm your appointment time.  Please arrive 30 minutes prior to your appointment for paperwork.  Please bring a copy of your photo ID and insurance  card. Contact information: [address] [place] [address] [phone]               No Known Allergies  Consultations: Surgery   Procedures/Studies: CT ABDOMEN PELVIS W CONTRAST  Result Date: 08/13/2020 CLINICAL DATA:  Rectal pain and diarrhea. EXAM: CT ABDOMEN AND PELVIS WITH CONTRAST TECHNIQUE: Multidetector CT imaging of the abdomen  and pelvis was performed using the standard protocol following bolus administration of intravenous contrast. CONTRAST:  OMNIPAQUE IOHEXOL 300 MG/ML  SOLN COMPARISON:  None. FINDINGS: Lower chest: No acute abnormality. Hepatobiliary: There is diffuse fatty infiltration of the liver parenchyma. A 1.4 cm x 1.1 cm focus of parenchymal low attenuation is seen within the anteromedial aspect of the right lobe of the liver. Additional subcentimeter foci of parenchymal low attenuation are seen within the posterior aspect of the right lobe and anterior aspect of the left lobe. No gallstones, gallbladder wall thickening, or biliary dilatation. Pancreas: Unremarkable. No pancreatic ductal dilatation or surrounding inflammatory changes. Spleen: Normal in size without focal abnormality. Adrenals/Urinary Tract: The right adrenal gland is normal in size and appearance. A 1.0 cm x 1.0 cm low-attenuation left adrenal mass is seen. Kidneys are normal, without renal calculi, focal lesion, or hydronephrosis. Bladder is unremarkable. Stomach/Bowel: Stomach is within normal limits. Appendix appears normal. No evidence of bowel wall thickening, distention, or inflammatory changes. Vascular/Lymphatic: Aortic atherosclerosis. No enlarged abdominal or pelvic lymph nodes. Reproductive: The prostate gland is mildly enlarged Other: No abdominal wall hernia or abnormality. No abdominopelvic ascites. Musculoskeletal: No acute or significant osseous findings. IMPRESSION: 1. Findings consistent with a posterior perirectal abscess. 2. Fatty liver with suspected hepatic cysts versus hemangiomas. 3. Small left adrenal mass which may represent an adrenal adenoma. Electronically Signed   By: Aram Candela M.D.   On: 08/13/2020 15:26      Subjective: Patient seen and examined at the bedside this morning.  Hemodynamically stable for discharge.  Discharge Exam: Vitals:   08/15/20 2103 08/16/20 0509  BP: 127/79 (!) 152/93  Pulse: 70 68   Resp: 18 16  Temp: 98.4 F (36.9 C) 98.7 F (37.1 C)  SpO2: 95% 98%   Vitals:   08/15/20 1057 08/15/20 1746 08/15/20 2103 08/16/20 0509  BP: 138/87 (!) 136/93 127/79 (!) 152/93  Pulse: 61 86 70 68  Resp: 18 18 18 16   Temp: 98 F (36.7 C) 98.5 F (36.9 C) 98.4 F (36.9 C) 98.7 F (37.1 C)  TempSrc: Oral Oral Oral Oral  SpO2: 99% 100% 95% 98%  Weight:      Height:        General: Pt is alert, awake, not in acute distress Cardiovascular: RRR, S1/S2 +, no rubs, no gallops Respiratory: CTA bilaterally, no wheezing, no rhonchi Abdominal: Soft, NT, ND, bowel sounds + Extremities: no edema, no cyanosis    The results of significant diagnostics from this hospitalization (including imaging, microbiology, ancillary and laboratory) are listed below for reference.     Microbiology: Recent Results (from the past 240 hour(s))  Resp Panel by RT-PCR (Flu A&B, Covid) Nasopharyngeal Swab     Status: None   Collection Time: 08/13/20  2:31 PM   Specimen: Nasopharyngeal Swab; Nasopharyngeal(NP) swabs in vial transport medium  Result Value Ref Range Status   SARS Coronavirus 2 by RT PCR NEGATIVE NEGATIVE Final    Comment: (NOTE) SARS-CoV-2 target nucleic acids are NOT DETECTED.  The SARS-CoV-2 RNA is generally detectable in upper respiratory specimens during the acute  phase of infection. The lowest concentration of SARS-CoV-2 viral copies this assay can detect is 138 copies/mL. A negative result does not preclude SARS-Cov-2 infection and should not be used as the sole basis for treatment or other patient management decisions. A negative result may occur with  improper specimen collection/handling, submission of specimen other than nasopharyngeal swab, presence of viral mutation(s) within the areas targeted by this assay, and inadequate number of viral copies(<138 copies/mL). A negative result must be combined with clinical observations, patient history, and  epidemiological information. The expected result is Negative.  Fact Sheet for Patients:  BloggerCourse.comhttps://www.fda.gov/media/152166/download  Fact Sheet for Healthcare Providers:  SeriousBroker.ithttps://www.fda.gov/media/152162/download  This test is no t yet approved or cleared by the [place] FDA and  has been authorized for detection and/or diagnosis of SARS-CoV-2 by FDA under an Emergency Use Authorization (EUA). This EUA will remain  in effect (meaning this test can be used) for the duration of the COVID-19 declaration under Section 564(b)(1) of the Act, 21 U.S.C.section 360bbb-3(b)(1), unless the authorization is terminated  or revoked sooner.       Influenza A by PCR NEGATIVE NEGATIVE Final   Influenza B by PCR NEGATIVE NEGATIVE Final    Comment: (NOTE) The Xpert Xpress SARS-CoV-2/FLU/RSV plus assay is intended as an aid in the diagnosis of influenza from Nasopharyngeal swab specimens and should not be used as a sole basis for treatment. Nasal washings and aspirates are unacceptable for Xpert Xpress SARS-CoV-2/FLU/RSV testing.  Fact Sheet for Patients: BloggerCourse.comhttps://www.fda.gov/media/152166/download  Fact Sheet for Healthcare Providers: SeriousBroker.ithttps://www.fda.gov/media/152162/download  This test is not yet approved or cleared by the [place] FDA and has been authorized for detection and/or diagnosis of SARS-CoV-2 by FDA under an Emergency Use Authorization (EUA). This EUA will remain in effect (meaning this test can be used) for the duration of the COVID-19 declaration under Section 564(b)(1) of the Act, 21 U.S.C. section 360bbb-3(b)(1), unless the authorization is terminated or revoked.  Performed at [facility], [address] [place], [place] [address]   Gastrointestinal Panel by PCR , Stool     Status: None   Collection Time: 08/13/20  3:25 PM   Specimen: Stool  Result Value Ref Range Status   Campylobacter species NOT DETECTED NOT DETECTED Final   Plesimonas  shigelloides NOT DETECTED NOT DETECTED Final   Salmonella species NOT DETECTED NOT DETECTED Final   Yersinia enterocolitica NOT DETECTED NOT DETECTED Final   Vibrio species NOT DETECTED NOT DETECTED Final   Vibrio cholerae NOT DETECTED NOT DETECTED Final   Enteroaggregative E coli (EAEC) NOT DETECTED NOT DETECTED Final   Enteropathogenic E coli (EPEC) NOT DETECTED NOT DETECTED Final   Enterotoxigenic E coli (ETEC) NOT DETECTED NOT DETECTED Final   Shiga like toxin producing E coli (STEC) NOT DETECTED NOT DETECTED Final   Shigella/Enteroinvasive E coli (EIEC) NOT DETECTED NOT DETECTED Final   Cryptosporidium NOT DETECTED NOT DETECTED Final   Cyclospora cayetanensis NOT DETECTED NOT DETECTED Final   Entamoeba histolytica NOT DETECTED NOT DETECTED Final   Giardia lamblia NOT DETECTED NOT DETECTED Final   Adenovirus F40/41 NOT DETECTED NOT DETECTED Final   Astrovirus NOT DETECTED NOT DETECTED Final   Norovirus GI/GII NOT DETECTED NOT DETECTED Final   Rotavirus A NOT DETECTED NOT DETECTED Final   Sapovirus (I, II, IV, and V) NOT DETECTED NOT DETECTED Final    Comment: Performed at [facility], [address] [place], [place] [address]  C Difficile Quick Screen w PCR reflex  Status: None   Collection Time: 08/13/20  3:25 PM   Specimen: Stool  Result Value Ref Range Status   C Diff antigen NEGATIVE NEGATIVE Final   C Diff toxin NEGATIVE NEGATIVE Final   C Diff interpretation No C. difficile detected.  Final    Comment: Performed at [facility], [address], [place], [place] [address]  Blood culture (routine x 2)     Status: None (Preliminary result)   Collection Time: 08/13/20  3:35 PM   Specimen: BLOOD RIGHT WRIST  Result Value Ref Range Status   Specimen Description   Final    BLOOD RIGHT WRIST Performed at [facility], [address] [place], [place] [address]    Special Requests   Final    BOTTLES DRAWN AEROBIC AND ANAEROBIC Blood Culture  adequate volume Performed at [facility], [address] [place], [place] [address]    Culture   Final    NO GROWTH 3 DAYS Performed at [facility], [address] [place], [place] [address]    Report Status PENDING  Incomplete  Blood culture (routine x 2)     Status: None (Preliminary result)   Collection Time: 08/13/20  3:36 PM   Specimen: BLOOD LEFT HAND  Result Value Ref Range Status   Specimen Description   Final    BLOOD LEFT HAND Performed at [facility], [address] [place], [place] [address]    Special Requests   Final    BOTTLES DRAWN AEROBIC AND ANAEROBIC Blood Culture adequate volume Performed at [facility], [address] [place], [place] [address]    Culture   Final    NO GROWTH 3 DAYS Performed at [facility], [address] [place], [place] [address]    Report Status PENDING  Incomplete     Labs: BNP (last 3 results) No results for input(s): BNP in the last 8760 hours. Basic Metabolic Panel: Recent Labs  Lab 08/13/20 1431 08/13/20 2233 08/14/20 0259 08/15/20 0313  NA 128*  --  131* 132*  K 3.2*  --  3.8 4.2  CL 91*  --  97* 97*  CO2 27  --  25 28  GLUCOSE 101*  --  94 152*  BUN 10  --  9 9  CREATININE 0.83  --  0.90 0.76  CALCIUM 8.0*  --  8.5* 8.7*  MG  --  2.0 1.9  --    Liver Function Tests: Recent Labs  Lab 08/13/20 1431  AST 47*  ALT 29  ALKPHOS 43  BILITOT 0.4  PROT 7.5  ALBUMIN 3.1*   Recent Labs  Lab 08/13/20 1431  LIPASE 50   No results for input(s): AMMONIA in the last 168 hours. CBC: Recent Labs  Lab 08/13/20 1431 08/14/20 0259 08/15/20 0313 08/15/20 1734 08/16/20 0639 08/16/20 1009  WBC 9.0 9.3 5.5  --   --   --   NEUTROABS 3.3  --  2.2  --   --   --   HGB 12.9* 12.5* 12.2* 11.5* 10.2* 10.3*  HCT 36.3* 37.4* 36.6* 34.0* 30.3* 30.7*  MCV 84.0 86.0 87.6  --   --   --   PLT 255 236 179  --   --   --    Cardiac Enzymes: No results for input(s): CKTOTAL,  CKMB, CKMBINDEX, TROPONINI in the last 168 hours. BNP: Invalid input(s): POCBNP CBG: No  results for input(s): GLUCAP in the last 168 hours. D-Dimer No results for input(s): DDIMER in the last 72 hours. Hgb A1c No results for input(s): HGBA1C in the last 72 hours. Lipid Profile No results for input(s): CHOL, HDL, LDLCALC, TRIG, CHOLHDL, LDLDIRECT in the last 72 hours. Thyroid function studies No results for input(s): TSH, T4TOTAL, T3FREE, THYROIDAB in the last 72 hours.  Invalid input(s): FREET3 Anemia work up No results for input(s): VITAMINB12, FOLATE, FERRITIN, TIBC, IRON, RETICCTPCT in the last 72 hours. Urinalysis    Component Value Date/Time   COLORURINE YELLOW 08/13/2020 1525   APPEARANCEUR CLEAR 08/13/2020 1525   LABSPEC <1.005 (L) 08/13/2020 1525   PHURINE 6.0 08/13/2020 1525   GLUCOSEU NEGATIVE 08/13/2020 1525   HGBUR TRACE (A) 08/13/2020 1525   BILIRUBINUR NEGATIVE 08/13/2020 1525   KETONESUR NEGATIVE 08/13/2020 1525   PROTEINUR NEGATIVE 08/13/2020 1525   NITRITE NEGATIVE 08/13/2020 1525   LEUKOCYTESUR NEGATIVE 08/13/2020 1525   Sepsis Labs Invalid input(s): PROCALCITONIN,  WBC,  LACTICIDVEN Microbiology Recent Results (from the past 240 hour(s))  Resp Panel by RT-PCR (Flu A&B, Covid) Nasopharyngeal Swab     Status: None   Collection Time: 08/13/20  2:31 PM   Specimen: Nasopharyngeal Swab; Nasopharyngeal(NP) swabs in vial transport medium  Result Value Ref Range Status   SARS Coronavirus 2 by RT PCR NEGATIVE NEGATIVE Final    Comment: (NOTE) SARS-CoV-2 target nucleic acids are NOT DETECTED.  The SARS-CoV-2 RNA is generally detectable in upper respiratory specimens during the acute phase of infection. The lowest concentration of SARS-CoV-2 viral copies this assay can detect is 138 copies/mL. A negative result does not preclude SARS-Cov-2 infection and should not be used as the sole basis for treatment or other patient management decisions. A negative result  may occur with  improper specimen collection/handling, submission of specimen other than nasopharyngeal swab, presence of viral mutation(s) within the areas targeted by this assay, and inadequate number of viral copies(<138 copies/mL). A negative result must be combined with clinical observations, patient history, and epidemiological information. The expected result is Negative.  Fact Sheet for Patients:  BloggerCourse.com  Fact Sheet for Healthcare Providers:  SeriousBroker.it  This test is no t yet approved or cleared by the [place] FDA and  has been authorized for detection and/or diagnosis of SARS-CoV-2 by FDA under an Emergency Use Authorization (EUA). This EUA will remain  in effect (meaning this test can be used) for the duration of the COVID-19 declaration under Section 564(b)(1) of the Act, 21 U.S.C.section 360bbb-3(b)(1), unless the authorization is terminated  or revoked sooner.       Influenza A by PCR NEGATIVE NEGATIVE Final   Influenza B by PCR NEGATIVE NEGATIVE Final    Comment: (NOTE) The Xpert Xpress SARS-CoV-2/FLU/RSV plus assay is intended as an aid in the diagnosis of influenza from Nasopharyngeal swab specimens and should not be used as a sole basis for treatment. Nasal washings and aspirates are unacceptable for Xpert Xpress SARS-CoV-2/FLU/RSV testing.  Fact Sheet for Patients: BloggerCourse.com  Fact Sheet for Healthcare Providers: SeriousBroker.it  This test is not yet approved or cleared by the [place] FDA and has been authorized for detection and/or diagnosis of SARS-CoV-2 by FDA under an Emergency Use Authorization (EUA). This EUA will remain in effect (meaning this test can be used) for the duration of the COVID-19 declaration under Section 564(b)(1) of the Act, 21 U.S.C. section 360bbb-3(b)(1), unless the authorization is terminated  or revoked.  Performed at [facility],  [address] [place], [place] [address]   Gastrointestinal Panel by PCR , Stool     Status: None   Collection Time: 08/13/20  3:25 PM   Specimen: Stool  Result Value Ref Range Status   Campylobacter species NOT DETECTED NOT DETECTED Final   Plesimonas shigelloides NOT DETECTED NOT DETECTED Final   Salmonella species NOT DETECTED NOT DETECTED Final   Yersinia enterocolitica NOT DETECTED NOT DETECTED Final   Vibrio species NOT DETECTED NOT DETECTED Final   Vibrio cholerae NOT DETECTED NOT DETECTED Final   Enteroaggregative E coli (EAEC) NOT DETECTED NOT DETECTED Final   Enteropathogenic E coli (EPEC) NOT DETECTED NOT DETECTED Final   Enterotoxigenic E coli (ETEC) NOT DETECTED NOT DETECTED Final   Shiga like toxin producing E coli (STEC) NOT DETECTED NOT DETECTED Final   Shigella/Enteroinvasive E coli (EIEC) NOT DETECTED NOT DETECTED Final   Cryptosporidium NOT DETECTED NOT DETECTED Final   Cyclospora cayetanensis NOT DETECTED NOT DETECTED Final   Entamoeba histolytica NOT DETECTED NOT DETECTED Final   Giardia lamblia NOT DETECTED NOT DETECTED Final   Adenovirus F40/41 NOT DETECTED NOT DETECTED Final   Astrovirus NOT DETECTED NOT DETECTED Final   Norovirus GI/GII NOT DETECTED NOT DETECTED Final   Rotavirus A NOT DETECTED NOT DETECTED Final   Sapovirus (I, II, IV, and V) NOT DETECTED NOT DETECTED Final    Comment: Performed at [facility], [address] [place], [place] [address]  C Difficile Quick Screen w PCR reflex     Status: None   Collection Time: 08/13/20  3:25 PM   Specimen: Stool  Result Value Ref Range Status   C Diff antigen NEGATIVE NEGATIVE Final   C Diff toxin NEGATIVE NEGATIVE Final   C Diff interpretation No C. difficile detected.  Final    Comment: Performed at [facility], [address] [place], [place] [address]  Blood culture (routine x 2)     Status: None (Preliminary result)    Collection Time: 08/13/20  3:35 PM   Specimen: BLOOD RIGHT WRIST  Result Value Ref Range Status   Specimen Description   Final    BLOOD RIGHT WRIST Performed at [facility], [address] [place], [place] [address]    Special Requests   Final    BOTTLES DRAWN AEROBIC AND ANAEROBIC Blood Culture adequate volume Performed at [facility], [address] [place], [place] [address]    Culture   Final    NO GROWTH 3 DAYS Performed at [facility], [address], [place], [place] [address]    Report Status PENDING  Incomplete  Blood culture (routine x 2)     Status: None (Preliminary result)   Collection Time: 08/13/20  3:36 PM   Specimen: BLOOD LEFT HAND  Result Value Ref Range Status   Specimen Description   Final    BLOOD LEFT HAND Performed at [facility] [address] [place], [place] [address]    Special Requests   Final    BOTTLES DRAWN AEROBIC AND ANAEROBIC Blood Culture adequate volume Performed at [facility], [address] [place], [place] [address]    Culture   Final    NO GROWTH 3 DAYS Performed at [facility], [address] [place], [place] [address]    Report Status PENDING  Incomplete    Please note: You were cared for by a hospitalist  during your hospital stay. Once you are discharged, your primary care physician will handle any further medical issues. Please note that NO REFILLS for any discharge medications will be authorized once you are discharged, as it is imperative that you return to your primary care physician (or establish a relationship with a primary care physician if you do not have one) for your post hospital discharge needs so that they can reassess your need for medications and monitor your lab values.    Time coordinating discharge: 40 minutes  SIGNED:   Burnadette Pop, MD  [facility] 08/16/2020, 10:30 AM Pager [phone]  If 7PM-7AM, please contact  night-coverage [email or web address] Password [email or web address]

## 2020-08-18 LAB — CULTURE, BLOOD (ROUTINE X 2)
Culture: NO GROWTH
Culture: NO GROWTH
Special Requests: ADEQUATE
Special Requests: ADEQUATE

## 2020-11-25 ENCOUNTER — Emergency Department (HOSPITAL_COMMUNITY): Payer: Self-pay

## 2020-11-25 ENCOUNTER — Encounter (HOSPITAL_COMMUNITY): Payer: Self-pay

## 2020-11-25 ENCOUNTER — Other Ambulatory Visit: Payer: Self-pay

## 2020-11-25 ENCOUNTER — Emergency Department (HOSPITAL_COMMUNITY)
Admission: EM | Admit: 2020-11-25 | Discharge: 2020-11-25 | Disposition: A | Discharge status: Home / Self Care | Payer: Self-pay | Attending: Emergency Medicine | Admitting: Emergency Medicine

## 2020-11-25 DIAGNOSIS — T510X1A Toxic effect of ethanol, accidental (unintentional), initial encounter: Secondary | ICD-10-CM | POA: Insufficient documentation

## 2020-11-25 DIAGNOSIS — F17210 Nicotine dependence, cigarettes, uncomplicated: Secondary | ICD-10-CM | POA: Insufficient documentation

## 2020-11-25 DIAGNOSIS — T50901A Poisoning by unspecified drugs, medicaments and biological substances, accidental (unintentional), initial encounter: Secondary | ICD-10-CM

## 2020-11-25 DIAGNOSIS — F10920 Alcohol use, unspecified with intoxication, uncomplicated: Secondary | ICD-10-CM

## 2020-11-25 DIAGNOSIS — Y908 Blood alcohol level of 240 mg/100 ml or more: Secondary | ICD-10-CM | POA: Insufficient documentation

## 2020-11-25 DIAGNOSIS — Z79899 Other long term (current) drug therapy: Secondary | ICD-10-CM | POA: Insufficient documentation

## 2020-11-25 HISTORY — DX: Alcohol abuse, uncomplicated: F10.10

## 2020-11-25 LAB — CBC WITH DIFFERENTIAL/PLATELET
Abs Immature Granulocytes: 0.01 10*3/uL (ref 0.00–0.07)
Basophils Absolute: 0 10*3/uL (ref 0.0–0.1)
Basophils Relative: 1 %
Eosinophils Absolute: 0 10*3/uL (ref 0.0–0.5)
Eosinophils Relative: 1 %
HCT: 40.7 % (ref 39.0–52.0)
Hemoglobin: 13.9 g/dL (ref 13.0–17.0)
Immature Granulocytes: 0 %
Lymphocytes Relative: 33 %
Lymphs Abs: 1.4 10*3/uL (ref 0.7–4.0)
MCH: 29.6 pg (ref 26.0–34.0)
MCHC: 34.2 g/dL (ref 30.0–36.0)
MCV: 86.6 fL (ref 80.0–100.0)
Monocytes Absolute: 0.4 10*3/uL (ref 0.1–1.0)
Monocytes Relative: 9 %
Neutro Abs: 2.4 10*3/uL (ref 1.7–7.7)
Neutrophils Relative %: 56 %
Platelets: 321 10*3/uL (ref 150–400)
RBC: 4.7 MIL/uL (ref 4.22–5.81)
RDW: 12.6 % (ref 11.5–15.5)
WBC: 4.2 10*3/uL (ref 4.0–10.5)
nRBC: 0 % (ref 0.0–0.2)

## 2020-11-25 LAB — COMPREHENSIVE METABOLIC PANEL
ALT: 15 U/L (ref 0–44)
AST: 24 U/L (ref 15–41)
Albumin: 3.9 g/dL (ref 3.5–5.0)
Alkaline Phosphatase: 63 U/L (ref 38–126)
Anion gap: 9 (ref 5–15)
BUN: 6 mg/dL (ref 6–20)
CO2: 24 mmol/L (ref 22–32)
Calcium: 8.2 mg/dL — ABNORMAL LOW (ref 8.9–10.3)
Chloride: 105 mmol/L (ref 98–111)
Creatinine, Ser: 0.76 mg/dL (ref 0.61–1.24)
GFR, Estimated: >60 mL/min (ref >60)
Glucose, Bld: 120 mg/dL — ABNORMAL HIGH (ref 70–99)
Potassium: 3.6 mmol/L (ref 3.5–5.1)
Sodium: 138 mmol/L (ref 135–145)
Total Bilirubin: 0.6 mg/dL (ref 0.3–1.2)
Total Protein: 8.2 g/dL — ABNORMAL HIGH (ref 6.5–8.1)

## 2020-11-25 LAB — SALICYLATE LEVEL: Salicylate Lvl: <7 mg/dL — ABNORMAL LOW (ref 7.0–30.0)

## 2020-11-25 LAB — ACETAMINOPHEN LEVEL: Acetaminophen (Tylenol), Serum: <10 ug/mL — ABNORMAL LOW (ref 10–30)

## 2020-11-25 LAB — ETHANOL: Alcohol, Ethyl (B): 335 mg/dL (ref <10)

## 2020-11-25 MED ORDER — NICOTINE 14 MG/24HR TD PT24
14.0000 mg | MEDICATED_PATCH | Freq: Once | TRANSDERMAL | Status: DC
  Administered 2020-11-25: 14 mg via TRANSDERMAL
  Filled 2020-11-25: qty 1

## 2020-11-25 MED ORDER — ONDANSETRON HCL 4 MG/2ML IJ SOLN
4.0000 mg | Freq: Once | INTRAMUSCULAR | Status: AC
  Administered 2020-11-25: 4 mg via INTRAVENOUS
  Filled 2020-11-25: qty 2

## 2020-11-25 NOTE — Discharge Instructions (Addendum)
Do not use any drugs or alcohol.  I have written for some antinausea meds  Take as prescribed  Return for new or worsening symptoms

## 2020-11-25 NOTE — ED Notes (Signed)
Unhooked himself from monitor and walked to bathroom. Did not use specimen collection cup. Encouraged to get back in bed and back on monitoring devices.

## 2020-11-25 NOTE — ED Notes (Signed)
INTOXICATED, DROWSY

## 2020-11-25 NOTE — ED Notes (Signed)
Refused to use urinal. Stated he wanted to walk to the bathroom. Hitting call bell and yelling at staff for food and to get up.

## 2020-11-25 NOTE — ED Triage Notes (Signed)
"  Found in bathroom at [facility]. ETOH intoxication and drug overdose. Unresponsive. Given Narcan 4mg  intranasal and bagged with BVM. Became responsive and uncooperative on the way to hospital. Now GCS 15. Patient not cooperative. Making vulguar comments to staff" per EMS

## 2020-11-25 NOTE — ED Provider Notes (Addendum)
[facility]-EMERGENCY DEPT Provider Note   CSN: [id number] Arrival date & time: 11/25/20  1458    History Chief Complaint  Patient presents with   Alcohol Intoxication   Drug Overdose    Mark Thornton is a 50 y.o. male with past medical history of etoh abuse, psychosis who presents for evaluation of OD.  Patient sleeping on arrival, intermittently responsive to loud voice.  Patient states he was at the [employer] drinking alcohol and someone came up to him and asked that he wanted to "shoot up."  He states he has never done drugs previously.  States he has been drinking alcohol over does not know how much.  Apparently bystander called EMS.  On arrival he was unresponsive, bradycardic.  Was given intranasal Narcan and bagged.  Patient became responsive, agitated.  Per EMS he has not been cooperative.  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 RRIGATION AND DEBRIDEMENT PERIRECTAL ABSCESS;  Surgeon: Luretha Murphy, MD;  Location: [facility];  Service: General;  Laterality: N/A;       History reviewed. No pertinent family history.  Social History   Tobacco Use   Smoking status: Every Day    Types: Cigarettes   Smokeless tobacco: Never  Substance Use Topics   Alcohol use: Yes    Comment: daily   Drug use: Yes    Types: IV, Cocaine    Home Medications Prior to Admission medications   Medication Sig Start Date End Date Taking? Authorizing Provider  acetaminophen (TYLENOL) 500 MG tablet Take 1,000 mg by mouth  every 6 (six) hours as needed for mild pain or headache.    [provider]  Multiple Vitamin (MULTIVITAMIN) capsule Take 1 capsule by mouth daily.    [provider]    Allergies    Patient has no known allergies.  Review of Systems   Review of Systems  Unable to perform ROS: Mental status change  All other systems reviewed and are negative.  Physical Exam Updated Vital Signs BP (!) 145/104   Pulse 63   Temp 97.9 F (36.6 C) (Oral)   Resp 17   Ht 5\' 11"  (1.803 m)   Wt 77.1 kg   SpO2 97%   BMI 23.71 kg/m   Physical Exam Vitals and nursing note reviewed.  Constitutional:      General: He is not in acute distress.    Appearance: He is well-developed. He is not ill-appearing, toxic-appearing or diaphoretic.  HENT:     Head: Normocephalic and atraumatic.     Comments: No obvious head injury    Nose: Nose normal.     Mouth/Throat:     Mouth: Mucous membranes are moist.  Eyes:     Pupils: Pupils are equal, round, and reactive to light.  Cardiovascular:     Rate and Rhythm: Normal rate and regular rhythm.     Pulses: Normal pulses.     Heart sounds: Normal heart sounds.  Pulmonary:     Effort: Pulmonary effort is normal.  No respiratory distress.     Breath sounds: Normal breath sounds.  Abdominal:     General: Bowel sounds are normal. There is no distension.     Palpations: Abdomen is soft.     Tenderness: There is no abdominal tenderness. There is no guarding or rebound.  Musculoskeletal:        General: Normal range of motion.     Cervical back: Normal range of motion and neck supple.     Comments: Moves all 4 extremities without difficulty.  Skin:    General: Skin is warm and dry.     Capillary Refill: Capillary refill takes less than 2 seconds.  Neurological:     Mental Status: He is alert.     Comments: Sleepy, arousable to loud voice Follows command Cranial nerves II through XII grossly intact    ED Results / Procedures / Treatments    Labs (all labs ordered are listed, but only abnormal results are displayed) Labs Reviewed  COMPREHENSIVE METABOLIC PANEL - Abnormal; Notable for the following components:      Result Value   Glucose, Bld 120 (*)    Calcium 8.2 (*)    Total Protein 8.2 (*)    All other components within normal limits  ETHANOL - Abnormal; Notable for the following components:   Alcohol, Ethyl (B) 335 (*)    All other components within normal limits  SALICYLATE LEVEL - Abnormal; Notable for the following components:   Salicylate Lvl <7.0 (*)    All other components within normal limits  ACETAMINOPHEN LEVEL - Abnormal; Notable for the following components:   Acetaminophen (Tylenol), Serum <10 (*)    All other components within normal limits  CBC WITH DIFFERENTIAL/PLATELET  RAPID URINE DRUG SCREEN, HOSP PERFORMED  URINALYSIS, ROUTINE W REFLEX MICROSCOPIC    EKG EKG Interpretation  Date/Time:  Sunday November 25 2020 15:33:53 EDT Ventricular Rate:  75 PR Interval:  173 QRS Duration: 106 QT Interval:  425 QTC Calculation: 475 R Axis:   65 Text Interpretation: Sinus rhythm Left atrial enlargement Borderline T abnormalities, lateral leads Confirmed by Bethann Berkshire [phone]) on 11/25/2020 10:07:47 PM  Radiology CT HEAD WO CONTRAST ( )  Result Date: 11/25/2020 CLINICAL DATA:  Delirium, altered mental status. EXAM: CT HEAD WITHOUT CONTRAST TECHNIQUE: Contiguous axial images were obtained from the base of the skull through the vertex without intravenous contrast. COMPARISON:  None. FINDINGS: Brain: No acute intracranial abnormality. Specifically, no hemorrhage, hydrocephalus, mass lesion, acute infarction, or significant intracranial injury. Vascular: No hyperdense vessel or unexpected calcification. Skull: No acute calvarial abnormality. Sinuses/Orbits: Mucosal thickening in the left maxillary sinus. No air-fluid levels. Other: None IMPRESSION: No acute intracranial abnormality. Electronically Signed    By: Charlett Nose M.D.   On: 11/25/2020 16:26    Procedures Procedures   Medications Ordered in ED Medications  nicotine (NICODERM CQ - dosed in mg/24 hours) patch 14 mg (14 mg Transdermal Patch Applied 11/25/20 2130)  ondansetron (ZOFRAN) injection 4 mg (4 mg Intravenous Given 11/25/20 2059)   ED Course  I have reviewed the triage vital signs and the nursing notes.  Pertinent labs & imaging results that were available during my care of the patient were reviewed by me and considered in my medical decision making (see chart for details).  Here for evaluation of Overdose, Apparently using alcohol and had injected an unknown substance prior to arrival.  Required Narcan.  On arrival he is sleepy however arousable to loud voice.  He is protecting his  airway.  No obvious traumatic injuries on exam.  We will plan on labs, imaging, reassess  Labs and imaging personally reviewed and interpreted:  Ethanol 335 Acetaminophen, salicylate within normal limits CBC without leukocytosis Metabolic panel mild hyperglycemia 120 CT head without acute abnormality EKG without ischemic changes  Patient reassessed.  More awake.  No hypoxia.  Patient reassessed.  Has crawled out of bed and is vomiting in the trash can at bedside.  Will administer antiemetic. Urinated on the bed. No CP, SOB, numbness, weakness.  Patient reassessed.  Sx improved.  Ambulatory without difficulty and tolerating p.o. intake.  Discussed with patient alcohol cessation, not using any illicit substances.  He is agreeable.  Provided resources outpatient.  He will return for any worsening symptoms.  The patient has been appropriately medically screened and/or stabilized in the ED. I have low suspicion for any other emergent medical condition which would require further screening, evaluation or treatment in the ED or require inpatient management.  Patient is hemodynamically stable and in no acute distress.  Patient able to ambulate in  department prior to ED.  Evaluation does not show acute pathology that would require ongoing or additional emergent interventions while in the emergency department or further inpatient treatment.  I have discussed the diagnosis with the patient and answered all questions.  Pain is been managed while in the emergency department and patient has no further complaints prior to discharge.  Patient is comfortable with plan discussed in room and is stable for discharge at this time.  I have discussed strict return precautions for returning to the emergency department.  Patient was encouraged to follow-up with PCP/specialist refer to at discharge.     MDM Rules/Calculators/A&P                           Final Clinical Impression(s) / ED Diagnoses Final diagnoses:  Alcoholic intoxication without complication (HCC)  Accidental overdose, initial encounter    Rx / DC Orders ED Discharge Orders     None          Gunda Maqueda A, PA-C 11/25/20 2214    Bethann Berkshire, MD 11/26/20 1105

## 2020-12-18 ENCOUNTER — Encounter (HOSPITAL_COMMUNITY): Payer: Self-pay

## 2020-12-18 ENCOUNTER — Emergency Department (HOSPITAL_COMMUNITY): Payer: Self-pay

## 2020-12-18 ENCOUNTER — Emergency Department (HOSPITAL_COMMUNITY)
Admission: EM | Admit: 2020-12-18 | Discharge: 2020-12-18 | Disposition: A | Discharge status: Home / Self Care | Payer: Self-pay | Attending: Emergency Medicine | Admitting: Emergency Medicine

## 2020-12-18 DIAGNOSIS — R109 Unspecified abdominal pain: Secondary | ICD-10-CM | POA: Insufficient documentation

## 2020-12-18 DIAGNOSIS — M549 Dorsalgia, unspecified: Secondary | ICD-10-CM | POA: Insufficient documentation

## 2020-12-18 DIAGNOSIS — Z7982 Long term (current) use of aspirin: Secondary | ICD-10-CM | POA: Insufficient documentation

## 2020-12-18 DIAGNOSIS — F17210 Nicotine dependence, cigarettes, uncomplicated: Secondary | ICD-10-CM | POA: Insufficient documentation

## 2020-12-18 DIAGNOSIS — R748 Abnormal levels of other serum enzymes: Secondary | ICD-10-CM

## 2020-12-18 DIAGNOSIS — R059 Cough, unspecified: Secondary | ICD-10-CM | POA: Insufficient documentation

## 2020-12-18 DIAGNOSIS — Z20822 Contact with and (suspected) exposure to covid-19: Secondary | ICD-10-CM | POA: Insufficient documentation

## 2020-12-18 DIAGNOSIS — Z79899 Other long term (current) drug therapy: Secondary | ICD-10-CM | POA: Insufficient documentation

## 2020-12-18 DIAGNOSIS — R7401 Elevation of levels of liver transaminase levels: Secondary | ICD-10-CM | POA: Insufficient documentation

## 2020-12-18 LAB — URINALYSIS, ROUTINE W REFLEX MICROSCOPIC
Bilirubin Urine: NEGATIVE
Glucose, UA: NEGATIVE mg/dL
Ketones, ur: 20 mg/dL — AB
Leukocytes,Ua: NEGATIVE
Nitrite: NEGATIVE
Protein, ur: NEGATIVE mg/dL
Specific Gravity, Urine: 1.018 (ref 1.005–1.030)
pH: 5 (ref 5.0–8.0)

## 2020-12-18 LAB — RESP PANEL BY RT-PCR (FLU A&B, COVID) ARPGX2
Influenza A by PCR: NEGATIVE
Influenza B by PCR: NEGATIVE
SARS Coronavirus 2 by RT PCR: NEGATIVE

## 2020-12-18 LAB — COMPREHENSIVE METABOLIC PANEL
ALT: 104 U/L — ABNORMAL HIGH (ref 0–44)
AST: 209 U/L — ABNORMAL HIGH (ref 15–41)
Albumin: 4 g/dL (ref 3.5–5.0)
Alkaline Phosphatase: 61 U/L (ref 38–126)
Anion gap: 10 (ref 5–15)
BUN: 11 mg/dL (ref 6–20)
CO2: 30 mmol/L (ref 22–32)
Calcium: 9.6 mg/dL (ref 8.9–10.3)
Chloride: 94 mmol/L — ABNORMAL LOW (ref 98–111)
Creatinine, Ser: 0.76 mg/dL (ref 0.61–1.24)
GFR, Estimated: >60 mL/min (ref >60)
Glucose, Bld: 158 mg/dL — ABNORMAL HIGH (ref 70–99)
Potassium: 3.4 mmol/L — ABNORMAL LOW (ref 3.5–5.1)
Sodium: 134 mmol/L — ABNORMAL LOW (ref 135–145)
Total Bilirubin: 1 mg/dL (ref 0.3–1.2)
Total Protein: 8.4 g/dL — ABNORMAL HIGH (ref 6.5–8.1)

## 2020-12-18 LAB — CBC
HCT: 44.2 % (ref 39.0–52.0)
Hemoglobin: 15 g/dL (ref 13.0–17.0)
MCH: 29.1 pg (ref 26.0–34.0)
MCHC: 33.9 g/dL (ref 30.0–36.0)
MCV: 85.7 fL (ref 80.0–100.0)
Platelets: 291 10*3/uL (ref 150–400)
RBC: 5.16 MIL/uL (ref 4.22–5.81)
RDW: 12.7 % (ref 11.5–15.5)
WBC: 6.1 10*3/uL (ref 4.0–10.5)
nRBC: 0 % (ref 0.0–0.2)

## 2020-12-18 LAB — LIPASE, BLOOD: Lipase: 38 U/L (ref 11–51)

## 2020-12-18 MED ORDER — LABETALOL HCL 5 MG/ML IV SOLN
20.0000 mg | Freq: Once | INTRAVENOUS | Status: AC
  Administered 2020-12-18: 20 mg via INTRAVENOUS
  Filled 2020-12-18: qty 4

## 2020-12-18 MED ORDER — CYCLOBENZAPRINE HCL 10 MG PO TABS: 10.0000 mg | ORAL_TABLET | Freq: Two times a day (BID) | ORAL | 0 refills | Status: DC | PRN

## 2020-12-18 MED ORDER — ONDANSETRON HCL 4 MG/2ML IJ SOLN
4.0000 mg | Freq: Once | INTRAMUSCULAR | Status: AC
  Administered 2020-12-18: 4 mg via INTRAVENOUS
  Filled 2020-12-18: qty 2

## 2020-12-18 MED ORDER — NAPROXEN 375 MG PO TABS: 375.0000 mg | ORAL_TABLET | Freq: Two times a day (BID) | ORAL | 0 refills | Status: DC

## 2020-12-18 MED ORDER — AMLODIPINE BESYLATE 5 MG PO TABS: 5.0000 mg | ORAL_TABLET | Freq: Every day | ORAL | 0 refills | Status: DC

## 2020-12-18 MED ORDER — MORPHINE SULFATE (PF) 4 MG/ML IV SOLN
4.0000 mg | Freq: Once | INTRAVENOUS | Status: AC
  Administered 2020-12-18: 4 mg via INTRAVENOUS
  Filled 2020-12-18: qty 1

## 2020-12-18 NOTE — Discharge Instructions (Addendum)
Follow-up with a primary care doctor to recheck on your liver tests.  Avoid taking Tylenol or drinking alcohol.  Take the medications to help with the pain.  Return as needed for fevers worsening symptoms

## 2020-12-18 NOTE — ED Provider Notes (Signed)
[facility] DEPT Provider Note   CSN: [id number] Arrival date & time: 12/18/20  1045     History Chief Complaint  Patient presents with   Back Pain    Radiates to buttock     Mark Thornton is a 50 y.o. male.   Back Pain   Pt complains of pain in the right flank area the last few days.  Pt denies any falls or injuries.  He has been coughing.  Pain increases with coughing.  NO shortness of breath.  No fever.  No vomiting or diarrhea.  No dysuria.  No recent alcohol use.  No drug use.    Past Medical History:  Diagnosis Date   Alcohol abuse     Patient Active Problem List   Diagnosis Date Noted   Perirectal abscess 08/13/2020   Hyponatremia 08/13/2020   Hypokalemia 08/13/2020   Intermittent explosive disorder 12/11/2019   Psychosis, affective ([place]) 12/11/2019   Alcohol abuse 12/11/2019    Past Surgical History:  Procedure Laterality Date   IRRIGATION AND DEBRIDEMENT ABSCESS N/A 08/14/2020   Procedure: IEXAM UNDER ANES. RRIGATION AND DEBRIDEMENT PERIRECTAL ABSCESS;  Surgeon: Johnathan Hausen, MD;  Location: [facility];  Service: General;  Laterality: N/A;       No family history on file.  Social History   Tobacco Use   Smoking status: Every Day    Types: Cigarettes   Smokeless tobacco: Never  Substance Use Topics   Alcohol use: Yes    Comment: daily   Drug use: Yes    Types: IV, Cocaine    Home Medications Prior to Admission medications   Medication Sig Start Date End Date Taking? Authorizing Provider  amLODipine (NORVASC) 5 MG tablet Take 1 tablet (5 mg total) by mouth daily. 12/18/20 01/17/21 Yes Dorie Rank, MD  aspirin EC 81 MG tablet Take 81-162 mg by mouth as needed for mild pain (or headaches). Swallow whole.   Yes [provider]  cyclobenzaprine (FLEXERIL) 10 MG tablet Take 1 tablet (10 mg total) by mouth 2 (two) times daily as needed for muscle spasms. 12/18/20  Yes Dorie Rank, MD  naproxen (NAPROSYN) 375 MG  tablet Take 1 tablet (375 mg total) by mouth 2 (two) times daily. 12/18/20  Yes Dorie Rank, MD    Allergies    Patient has no known allergies.  Review of Systems   Review of Systems  Musculoskeletal:  Positive for back pain.  All other systems reviewed and are negative.  Physical Exam Updated Vital Signs BP (!) 169/94 (BP Location: Left Arm)   Pulse 65   Temp 98.1 F (36.7 C) (Oral)   Resp 18   Ht 1.803 m (5\' 11" )   Wt 77.1 kg   SpO2 99%   BMI 23.71 kg/m   Physical Exam Vitals and nursing note reviewed.  Constitutional:      General: He is not in acute distress.    Appearance: He is well-developed.  HENT:     Head: Normocephalic and atraumatic.     Right Ear: External ear normal.     Left Ear: External ear normal.  Eyes:     General: No scleral icterus.       Right eye: No discharge.        Left eye: No discharge.     Conjunctiva/sclera: Conjunctivae normal.  Neck:     Trachea: No tracheal deviation.  Cardiovascular:     Rate and Rhythm: Normal rate and regular rhythm.  Pulmonary:  Effort: Pulmonary effort is normal. No respiratory distress.     Breath sounds: Normal breath sounds. No stridor. No wheezing or rales.  Abdominal:     General: Bowel sounds are normal. There is no distension.     Palpations: Abdomen is soft.     Tenderness: There is no abdominal tenderness. There is right CVA tenderness. There is no guarding or rebound.  Musculoskeletal:        General: No tenderness or deformity.     Cervical back: Neck supple.  Skin:    General: Skin is warm and dry.     Findings: No rash.  Neurological:     General: No focal deficit present.     Mental Status: He is alert.     Cranial Nerves: No cranial nerve deficit (no facial droop, extraocular movements intact, no slurred speech).     Sensory: No sensory deficit.     Motor: No abnormal muscle tone or seizure activity.     Coordination: Coordination normal.  Psychiatric:        Mood and Affect: Mood  normal.    ED Results / Procedures / Treatments   Labs (all labs ordered are listed, but only abnormal results are displayed) Labs Reviewed  COMPREHENSIVE METABOLIC PANEL - Abnormal; Notable for the following components:      Result Value   Sodium 134 (*)    Potassium 3.4 (*)    Chloride 94 (*)    Glucose, Bld 158 (*)    Total Protein 8.4 (*)    AST 209 (*)    ALT 104 (*)    All other components within normal limits  URINALYSIS, ROUTINE W REFLEX MICROSCOPIC - Abnormal; Notable for the following components:   Hgb urine dipstick SMALL (*)    Ketones, ur 20 (*)    Bacteria, UA RARE (*)    All other components within normal limits  RESP PANEL BY RT-PCR (FLU A&B, COVID) ARPGX2  CBC  LIPASE, BLOOD  HEPATITIS PANEL, ACUTE    EKG None  Radiology DG Chest 2 View  Result Date: 12/18/2020 CLINICAL DATA:  pain, right side, cough EXAM: CHEST - 2 VIEW COMPARISON:  Chest x-ray 12/20/2019, CT chest 12/02/2009 FINDINGS: The heart and mediastinal contours are within normal limits. Biapical cystic changes and pleural/pulmonary scarring. No focal consolidation. No pulmonary edema. No pleural effusion. No pneumothorax. No acute osseous abnormality. IMPRESSION: 1. No active cardiopulmonary disease. 2.  Emphysema (ICD10-J43.9). Electronically Signed   By: Tish Frederickson M.D.   On: 12/18/2020 20:16   US Abdomen Limited RUQ (LIVER/GB)  Result Date: 12/18/2020 CLINICAL DATA:  Right upper quadrant pain. EXAM: ULTRASOUND ABDOMEN LIMITED RIGHT UPPER QUADRANT COMPARISON:  CT abdomen and pelvis 08/13/2020. FINDINGS: Gallbladder: No gallstones or wall thickening visualized. No sonographic Murphy sign noted by sonographer. Common bile duct: Diameter: 2.1 mm. Liver: There is a 1.1 x 1.1 x 1.2 cm homogeneous echogenic lesion in the right lobe of the liver favored as hemangioma. Within normal limits in parenchymal echogenicity. Portal vein is patent on color Doppler imaging with normal direction of blood flow  towards the liver. Other: None. IMPRESSION: 1. No evidence for cholelithiasis. 2. Echogenic lesion in the liver favored as hemangioma. Electronically Signed   By: Darliss Cheney M.D.   On: 12/18/2020 21:56    Procedures Procedures   Medications Ordered in ED Medications  morphine 4 MG/ML injection 4 mg (4 mg Intravenous Given 12/18/20 1942)  ondansetron (ZOFRAN) injection 4 mg (4 mg Intravenous Given  12/18/20 1939)  labetalol (NORMODYNE) injection 20 mg (20 mg Intravenous Given 12/18/20 1945)    ED Course  I have reviewed the triage vital signs and the nursing notes.  Pertinent labs & imaging results that were available during my care of the patient were reviewed by me and considered in my medical decision making (see chart for details).  Clinical Course as of 12/18/20 2315  Tue Dec 18, 2020  2059 Urinalysis, Routine w reflex microscopic Urine, Clean Catch(!) Urinalysis without signs of infection.  No hematuria [JK]  2100 CBC CBC normal [JK]  2100 Lipase, blood Lipase normal [JK]  2100 Comprehensive metabolic panel(!) LFTs elevated.  New compared to previous values [JK]  2247 No evidence of cholecystitis or cholelithiasis.   [JK]    Clinical Course User Index [JK] Dorie Rank, MD   MDM Rules/Calculators/A&P                           Patient presented with right flank pain.  No signs of pneumonia on x-ray.  Patient did have elevated LFTs so ultrasound was performed to evaluate for the possibility of cholecystitis or cholelithiasis.  Ultrasound was negative for acute findings.  Patient was noted to be hypertensive.  His blood pressure has improved with treatment.  No signs of hypertensive emergency patient remained comfortable in the ED.  Suspect pain is more musculoskeletal in nature.  Will discharge home with medications pain.  Also prescribe medications for his blood pressure and recommend outpatient follow-up with primary care doctor Final Clinical Impression(s) / ED  Diagnoses Final diagnoses:  Abdominal pain  Flank pain  Elevated liver enzymes    Rx / DC Orders ED Discharge Orders          Ordered    naproxen (NAPROSYN) 375 MG tablet  2 times daily        12/18/20 2312    cyclobenzaprine (FLEXERIL) 10 MG tablet  2 times daily PRN        12/18/20 2312    amLODipine (NORVASC) 5 MG tablet  Daily        12/18/20 2315             Dorie Rank, MD 12/18/20 2316

## 2020-12-18 NOTE — ED Triage Notes (Addendum)
Pt reports right lower back pain that radiates into right buttock. Started about 2 weeks ago and worse after walking. 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 Also reports right lower rib cage pain. Reports hx of "Cracked ribs" per pt.   9/10 pain when walking. Reports he has not taken any OTC meds yet.   A/ox4 Ambulatory in triage

## 2020-12-19 LAB — HEPATITIS PANEL, ACUTE
HCV Ab: REACTIVE — AB
Hep A IgM: NONREACTIVE
Hep B C IgM: NONREACTIVE
Hepatitis B Surface Ag: NONREACTIVE

## 2021-02-24 ENCOUNTER — Ambulatory Visit (HOSPITAL_COMMUNITY)
Admission: RE | Admit: 2021-02-24 | Discharge: 2021-02-24 | Disposition: A | Discharge status: Home / Self Care | Payer: No Payment, Other | Attending: Student | Admitting: Student

## 2021-02-24 ENCOUNTER — Emergency Department (HOSPITAL_COMMUNITY): Payer: Self-pay

## 2021-02-24 ENCOUNTER — Emergency Department (HOSPITAL_COMMUNITY)
Admission: EM | Admit: 2021-02-24 | Discharge: 2021-02-25 | Disposition: A | Discharge status: Home / Self Care | Payer: Self-pay | Attending: Emergency Medicine | Admitting: Emergency Medicine

## 2021-02-24 ENCOUNTER — Other Ambulatory Visit: Payer: Self-pay

## 2021-02-24 ENCOUNTER — Emergency Department (HOSPITAL_COMMUNITY)
Admission: EM | Admit: 2021-02-24 | Discharge: 2021-02-24 | Disposition: A | Discharge status: Home / Self Care | Payer: Self-pay | Attending: Emergency Medicine | Admitting: Emergency Medicine

## 2021-02-24 ENCOUNTER — Encounter (HOSPITAL_COMMUNITY): Payer: Self-pay | Admitting: Emergency Medicine

## 2021-02-24 DIAGNOSIS — X31XXXA Exposure to excessive natural cold, initial encounter: Secondary | ICD-10-CM | POA: Insufficient documentation

## 2021-02-24 DIAGNOSIS — Z79899 Other long term (current) drug therapy: Secondary | ICD-10-CM | POA: Insufficient documentation

## 2021-02-24 DIAGNOSIS — T68XXXA Hypothermia, initial encounter: Secondary | ICD-10-CM | POA: Insufficient documentation

## 2021-02-24 DIAGNOSIS — Y908 Blood alcohol level of 240 mg/100 ml or more: Secondary | ICD-10-CM | POA: Insufficient documentation

## 2021-02-24 DIAGNOSIS — Z7982 Long term (current) use of aspirin: Secondary | ICD-10-CM | POA: Insufficient documentation

## 2021-02-24 DIAGNOSIS — Z20822 Contact with and (suspected) exposure to covid-19: Secondary | ICD-10-CM | POA: Insufficient documentation

## 2021-02-24 DIAGNOSIS — R112 Nausea with vomiting, unspecified: Secondary | ICD-10-CM | POA: Insufficient documentation

## 2021-02-24 DIAGNOSIS — F10220 Alcohol dependence with intoxication, uncomplicated: Secondary | ICD-10-CM | POA: Insufficient documentation

## 2021-02-24 DIAGNOSIS — T50901A Poisoning by unspecified drugs, medicaments and biological substances, accidental (unintentional), initial encounter: Secondary | ICD-10-CM | POA: Insufficient documentation

## 2021-02-24 LAB — CBC WITH DIFFERENTIAL/PLATELET
Abs Immature Granulocytes: 0.04 10*3/uL (ref 0.00–0.07)
Abs Immature Granulocytes: 0.01 10*3/uL (ref 0.00–0.07)
Basophils Absolute: 0 10*3/uL (ref 0.0–0.1)
Basophils Absolute: 0 10*3/uL (ref 0.0–0.1)
Basophils Relative: 1 %
Basophils Relative: 0 %
Eosinophils Absolute: 0.1 10*3/uL (ref 0.0–0.5)
Eosinophils Absolute: 0.1 10*3/uL (ref 0.0–0.5)
Eosinophils Relative: 1 %
Eosinophils Relative: 2 %
HCT: 39.4 % (ref 39.0–52.0)
HCT: 41.7 % (ref 39.0–52.0)
Hemoglobin: 13.6 g/dL (ref 13.0–17.0)
Hemoglobin: 14.5 g/dL (ref 13.0–17.0)
Immature Granulocytes: 1 %
Immature Granulocytes: 0 %
Lymphocytes Relative: 25 %
Lymphocytes Relative: 24 %
Lymphs Abs: 1.6 10*3/uL (ref 0.7–4.0)
Lymphs Abs: 1.2 10*3/uL (ref 0.7–4.0)
MCH: 29.7 pg (ref 26.0–34.0)
MCH: 29.7 pg (ref 26.0–34.0)
MCHC: 34.5 g/dL (ref 30.0–36.0)
MCHC: 34.8 g/dL (ref 30.0–36.0)
MCV: 86 fL (ref 80.0–100.0)
MCV: 85.3 fL (ref 80.0–100.0)
Monocytes Absolute: 0.7 10*3/uL (ref 0.1–1.0)
Monocytes Absolute: 0.6 10*3/uL (ref 0.1–1.0)
Monocytes Relative: 10 %
Monocytes Relative: 12 %
Neutro Abs: 3.9 10*3/uL (ref 1.7–7.7)
Neutro Abs: 2.9 10*3/uL (ref 1.7–7.7)
Neutrophils Relative %: 62 %
Neutrophils Relative %: 62 %
Platelets: 313 10*3/uL (ref 150–400)
Platelets: 331 10*3/uL (ref 150–400)
RBC: 4.58 MIL/uL (ref 4.22–5.81)
RBC: 4.89 MIL/uL (ref 4.22–5.81)
RDW: 11.9 % (ref 11.5–15.5)
RDW: 11.9 % (ref 11.5–15.5)
WBC: 6.3 10*3/uL (ref 4.0–10.5)
WBC: 4.7 10*3/uL (ref 4.0–10.5)
nRBC: 0 % (ref 0.0–0.2)
nRBC: 0 % (ref 0.0–0.2)

## 2021-02-24 LAB — COMPREHENSIVE METABOLIC PANEL
ALT: 21 U/L (ref 0–44)
ALT: 21 U/L (ref 0–44)
AST: 31 U/L (ref 15–41)
AST: 31 U/L (ref 15–41)
Albumin: 4.8 g/dL (ref 3.5–5.0)
Albumin: 4.8 g/dL (ref 3.5–5.0)
Alkaline Phosphatase: 61 U/L (ref 38–126)
Alkaline Phosphatase: 60 U/L (ref 38–126)
Anion gap: 11 (ref 5–15)
Anion gap: 11 (ref 5–15)
BUN: 20 mg/dL (ref 6–20)
BUN: 20 mg/dL (ref 6–20)
CO2: 27 mmol/L (ref 22–32)
CO2: 28 mmol/L (ref 22–32)
Calcium: 9.4 mg/dL (ref 8.9–10.3)
Calcium: 9.7 mg/dL (ref 8.9–10.3)
Chloride: 99 mmol/L (ref 98–111)
Chloride: 97 mmol/L — ABNORMAL LOW (ref 98–111)
Creatinine, Ser: 0.83 mg/dL (ref 0.61–1.24)
Creatinine, Ser: 0.86 mg/dL (ref 0.61–1.24)
GFR, Estimated: >60 mL/min (ref >60)
GFR, Estimated: >60 mL/min (ref >60)
Glucose, Bld: 125 mg/dL — ABNORMAL HIGH (ref 70–99)
Glucose, Bld: 98 mg/dL (ref 70–99)
Potassium: 3.4 mmol/L — ABNORMAL LOW (ref 3.5–5.1)
Potassium: 4.5 mmol/L (ref 3.5–5.1)
Sodium: 137 mmol/L (ref 135–145)
Sodium: 136 mmol/L (ref 135–145)
Total Bilirubin: 1.5 mg/dL — ABNORMAL HIGH (ref 0.3–1.2)
Total Bilirubin: 1.7 mg/dL — ABNORMAL HIGH (ref 0.3–1.2)
Total Protein: 9.2 g/dL — ABNORMAL HIGH (ref 6.5–8.1)
Total Protein: 9.4 g/dL — ABNORMAL HIGH (ref 6.5–8.1)

## 2021-02-24 LAB — TROPONIN I (HIGH SENSITIVITY): Troponin I (High Sensitivity): 4 ng/L (ref <18)

## 2021-02-24 LAB — CBG MONITORING, ED: Glucose-Capillary: 110 mg/dL — ABNORMAL HIGH (ref 70–99)

## 2021-02-24 LAB — ETHANOL: Alcohol, Ethyl (B): <10 mg/dL (ref <10)

## 2021-02-24 LAB — LIPASE, BLOOD: Lipase: 24 U/L (ref 11–51)

## 2021-02-24 NOTE — ED Provider Notes (Signed)
[facility]-EMERGENCY DEPT Provider Note   CSN: [id number] Arrival date & time: 02/24/21  3154     History  Chief Complaint  Patient presents with   Drug Overdose    Mark Thornton is a 51 y.o. male.  The history is provided by the EMS personnel, medical records and the patient.  Drug Overdose Mark Thornton is a 51 y.o. male who presents to the Emergency Department complaining of possible drug overdose.  Level 5 caveat due to intoxication.  History is provided by EMS.  EMS reports that PD called out due to possible overdose.  He was picked up at [facility].  EMS believes that he was found outside by PD but location where he is found is not completely clear.  EMS reports that he fell asleep easily en route but did not require Narcan.     Home Medications Prior to Admission medications   Medication Sig Start Date End Date Taking? Authorizing Provider  amLODipine (NORVASC) 5 MG tablet Take 1 tablet (5 mg total) by mouth daily. 12/18/20 01/17/21  Linwood Dibbles, MD  aspirin EC 81 MG tablet Take 81-162 mg by mouth as needed for mild pain (or headaches). Swallow whole.    [provider]  cyclobenzaprine (FLEXERIL) 10 MG tablet Take 1 tablet (10 mg total) by mouth 2 (two) times daily as needed for muscle spasms. 12/18/20   Linwood Dibbles, MD  naproxen (NAPROSYN) 375 MG tablet Take 1 tablet (375 mg total) by mouth 2 (two) times daily. 12/18/20   Linwood Dibbles, MD      Allergies    Patient has no known allergies.    Review of Systems   Review of Systems  All other systems reviewed and are negative.  Physical Exam Updated Vital Signs BP (!) 171/107 (BP Location: Right Arm)    Pulse 73    Temp (!) 92.7 F (33.7 C) (Rectal)    Resp 11    Ht 6\' 2"  (1.88 m)    Wt 79.4 kg    SpO2 98%    BMI 22.47 kg/m  Physical Exam Vitals and nursing note reviewed.  Constitutional:      Appearance: He is well-developed.     Comments: Drowsy but awakens to painful stimuli   HENT:     Head: Normocephalic and atraumatic.  Cardiovascular:     Rate and Rhythm: Normal rate and regular rhythm.     Heart sounds: No murmur heard. Pulmonary:     Effort: Pulmonary effort is normal. No respiratory distress.     Breath sounds: Normal breath sounds.  Abdominal:     Palpations: Abdomen is soft.     Tenderness: There is no abdominal tenderness. There is no guarding or rebound.  Musculoskeletal:        General: No tenderness.  Skin:    General: Skin is dry.     Comments: Skin cool to touch  Neurological:     Comments: Moves all extremities symmetrically  Psychiatric:        Behavior: Behavior normal.    ED Results / Procedures / Treatments   Labs (all labs ordered are listed, but only abnormal results are displayed) Labs Reviewed  COMPREHENSIVE METABOLIC PANEL - Abnormal; Notable for the following components:      Result Value   Potassium 3.4 (*)    Glucose, Bld 125 (*)    Total Protein 9.2 (*)    Total Bilirubin 1.5 (*)    All other components within  normal limits  CBG MONITORING, ED - Abnormal; Notable for the following components:   Glucose-Capillary 110 (*)    All other components within normal limits  CBC WITH DIFFERENTIAL/PLATELET  ETHANOL  URINALYSIS, ROUTINE W REFLEX MICROSCOPIC  TROPONIN I (HIGH SENSITIVITY)    EKG EKG Interpretation  Date/Time:  Sunday February 24 2021 05:19:30 EST Ventricular Rate:  71 PR Interval:  190 QRS Duration: 107 QT Interval:  453 QTC Calculation: 493 R Axis:   67 Text Interpretation: Sinus rhythm Consider left atrial enlargement Abnormal T, consider ischemia, diffuse leads Confirmed by Tilden Fossa [phone]) on 02/24/2021 6:13:18 AM  Radiology CT Head Wo Contrast  Result Date: 02/24/2021 CLINICAL DATA:  Mental status changes, possible overdose EXAM: CT HEAD WITHOUT CONTRAST TECHNIQUE: Contiguous axial images were obtained from the base of the skull through the vertex without intravenous contrast. COMPARISON:   Prior head CT 11/25/2020 FINDINGS: Brain: No evidence of acute infarction, hemorrhage, hydrocephalus, extra-axial collection or mass lesion/mass effect. Vascular: No hyperdense vessel or unexpected calcification. Skull: Normal. Negative for fracture or focal lesion. Sinuses/Orbits: Partial opacification of the left maxillary sinus is similar compared to prior. Evidence of prior remote left maxillary anterior wall fracture. Other: None. IMPRESSION: 1. No acute intracranial abnormality. 2. 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 Electronically Signed   By: Malachy Moan M.D.   On: 02/24/2021 06:40    Procedures Procedures    Medications Ordered in ED Medications - No data to display  ED Course/ Medical Decision Making/ A&P Clinical Course as of 02/24/21 0736  Wynelle Link Feb 24, 2021  0713 Metabolize to freedom, pending trop [MK]    Clinical Course User Index [MK] Kommor, Wyn Forster, MD                           Medical Decision Making  Patient here for evaluation of intoxication.  Patient drowsy but awakens to painful stimuli on ED presentation.  Unsure what his ingestion was.  He is hypothermic on presentation and was treated with aggressive external rewarming.  CT head without acute abnormality, alcohol is within normal limits.  Patient care transferred pending metabolization of drugs, rewarming and reassessment.        Final Clinical Impression(s) / ED Diagnoses Final diagnoses:  None    Rx / DC Orders ED Discharge Orders     None         Tilden Fossa, MD 02/24/21 [phone]

## 2021-02-24 NOTE — ED Provider Notes (Signed)
[facility]-EMERGENCY DEPT Provider Note   CSN: [id number] Arrival date & time: 02/24/21  1749     History  Chief Complaint  Patient presents with   Emesis    Mark Thornton is a 51 y.o. male.  HPI Patient is a 51 year old male who presents to the emergency department due to nausea/vomiting that began earlier today.  Patient states last night he was drinking alcohol and smoking crack cocaine.  He was found outside, hypothermic, and was brought to the emergency department.  Patient was ultimately discharged in stable condition.  He was taken to the behavioral health urgent care earlier today by family member due to his ongoing substance abuse including alcohol and crack cocaine.  While at the behavioral health urgent care he began vomiting and was sent to the emergency department for further evaluation.  Since arriving to the emergency department patient denies any complaints.  He states that he has been having nausea and vomiting throughout the day today but denies any current nausea.  He is requesting something to drink.  Denies any abdominal pain, chest pain, shortness of breath.  States that he has not drank any alcohol today.  He states that he typically drinks alcohol on the weekends but will refrain during weekdays.    Home Medications Prior to Admission medications   Medication Sig Start Date End Date Taking? Authorizing Provider  amLODipine (NORVASC) 5 MG tablet Take 1 tablet (5 mg total) by mouth daily. 12/18/20 01/17/21  Linwood DibblesKnapp, Jon, MD  aspirin EC 81 MG tablet Take 81-162 mg by mouth as needed for mild pain (or headaches). Swallow whole.    [provider]  cyclobenzaprine (FLEXERIL) 10 MG tablet Take 1 tablet (10 mg total) by mouth 2 (two) times daily as needed for muscle spasms. 12/18/20   Linwood DibblesKnapp, Jon, MD  naproxen (NAPROSYN) 375 MG tablet Take 1 tablet (375 mg total) by mouth 2 (two) times daily. 12/18/20   Linwood DibblesKnapp, Jon, MD      Allergies    Patient  has no known allergies.    Review of Systems   Review of Systems  All other systems reviewed and are negative. Ten systems reviewed and are negative for acute change, except as noted in the HPI.   Physical Exam Updated Vital Signs BP (!) 151/116    Pulse 68    Temp 97.7 F (36.5 C) (Oral)    Resp 14    SpO2 100%  Physical Exam Vitals and nursing note reviewed.  Constitutional:      General: He is not in acute distress.    Appearance: Normal appearance. He is not ill-appearing, toxic-appearing or diaphoretic.  HENT:     Head: Normocephalic and atraumatic.     Right Ear: External ear normal.     Left Ear: External ear normal.     Nose: Nose normal.     Mouth/Throat:     Mouth: Mucous membranes are moist.     Pharynx: Oropharynx is clear. No oropharyngeal exudate or posterior oropharyngeal erythema.  Eyes:     General: No scleral icterus.       Right eye: No discharge.        Left eye: No discharge.     Extraocular Movements: Extraocular movements intact.     Conjunctiva/sclera: Conjunctivae normal.  Cardiovascular:     Rate and Rhythm: Normal rate and regular rhythm.     Pulses: Normal pulses.     Heart sounds: Murmur heard.    No  friction rub. No gallop.  Pulmonary:     Effort: Pulmonary effort is normal. No respiratory distress.     Breath sounds: Normal breath sounds. No stridor. No wheezing, rhonchi or rales.  Abdominal:     General: Abdomen is flat.     Palpations: Abdomen is soft.     Tenderness: There is no abdominal tenderness.     Comments: Abdomen is soft and nontender.  Musculoskeletal:        General: Normal range of motion.     Cervical back: Normal range of motion and neck supple. No tenderness.  Skin:    General: Skin is warm and dry.  Neurological:     General: No focal deficit present.     Mental Status: He is alert and oriented to person, place, and time.  Psychiatric:        Mood and Affect: Mood normal.        Behavior: Behavior normal.   ED  Results / Procedures / Treatments   Labs (all labs ordered are listed, but only abnormal results are displayed) Labs Reviewed  COMPREHENSIVE METABOLIC PANEL - Abnormal; Notable for the following components:      Result Value   Chloride 97 (*)    Total Protein 9.4 (*)    Total Bilirubin 1.7 (*)    All other components within normal limits  RESP PANEL BY RT-PCR (FLU A&B, COVID) ARPGX2  LIPASE, BLOOD  CBC WITH DIFFERENTIAL/PLATELET  URINALYSIS, ROUTINE W REFLEX MICROSCOPIC   EKG None  Radiology CT Head Wo Contrast  Result Date: 02/24/2021 CLINICAL DATA:  Mental status changes, possible overdose EXAM: CT HEAD WITHOUT CONTRAST TECHNIQUE: Contiguous axial images were obtained from the base of the skull through the vertex without intravenous contrast. COMPARISON:  Prior head CT 11/25/2020 FINDINGS: Brain: No evidence of acute infarction, hemorrhage, hydrocephalus, extra-axial collection or mass lesion/mass effect. Vascular: No hyperdense vessel or unexpected calcification. Skull: Normal. Negative for fracture or focal lesion. Sinuses/Orbits: Partial opacification of the left maxillary sinus is similar compared to prior. Evidence of prior remote left maxillary anterior wall fracture. Other: None. IMPRESSION: 1. No acute intracranial abnormality. 2. 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 Electronically Signed   By: Malachy Moan M.D.   On: 02/24/2021 06:40    Procedures Procedures    Medications Ordered in ED Medications  ondansetron (ZOFRAN-ODT) disintegrating tablet 8 mg (0 mg Oral Hold 02/25/21 0020)    ED Course/ Medical Decision Making/ A&P                            Medical Decision Making Pt is a 51 y.o. male who presents to the emergency department from the behavioral health urgent care due to nausea and vomiting.  Labs: CBC without abnormalities. CMP with a chloride of 97, total protein of 9.4, total bilirubin 1.7. Lipase of 24.  I, Placido Sou,  PA-C, personally reviewed and evaluated these images and lab results as part of my medical decision-making.  Patient initially evaluated in the ED last night.  He was stabilized and ultimately discharged in stable condition.  He then proceeded to go to the behavioral health urgent care earlier today for evaluation due to drug and alcohol abuse.  While there I was informed by my attending physician that he developed nausea and vomiting and he was sent to the emergency department for reevaluation.  Since coming to the emergency departments he initially had an episode of  vomiting which was nonbilious and nonbloody.  He denies any further nausea.  He has been tolerating p.o. intake for hours without antiemetics.  Ambulating without difficulty.  Patient states that he drinks alcohol on the weekends but does not drink on a daily basis.  Does not appear to be exhibiting symptoms of alcohol withdrawal.  Lab work appears generally reassuring.  CBC without leukocytosis.  CMP with a chloride of 97 but otherwise no electrolyte derangements.  Feel that the patient is stable for discharge at this time and he is agreeable.  Recommended that he follow-up with the behavioral health urgent care at his convenience.  Discussed return precautions.  His questions were answered and he was amicable at the time of discharge.  Patient discussed with and evaluated by my attending physician Dr. Mancel Bale who was in agreement with the above plan.  Note: Portions of this report may have been transcribed using voice recognition software. Every effort was made to ensure accuracy; however, inadvertent computerized transcription errors may be present.   Final Clinical Impression(s) / ED Diagnoses Final diagnoses:  Non-intractable vomiting with nausea   Rx / DC Orders ED Discharge Orders     None         Placido Sou, PA-C 02/25/21 1937    Mancel Bale, MD 02/27/21 [phone]

## 2021-02-24 NOTE — ED Triage Notes (Signed)
[employer] called EMS to pick up the patient due to possible overdose. Patient was picked up at [address]. Patient has times of alertness and then he falls asleep.

## 2021-02-24 NOTE — ED Provider Triage Note (Signed)
Emergency Medicine Provider Triage Evaluation Note  Mark Thornton , a 51 y.o. male  was evaluated in triage.  Pt complains of vomiting.  Patient states that he left ear this morning after being found unconscious by [employer].  He was evaluated and discharged.  This morning.  There is IVC paperwork that was taken out by the mother, Mark Thornton.  However prior to patient being discharged, Dr. Matilde Sprang, ED attending, rescinded the paperwork as it related to patient's habitual drug use and not due to the patient being a harm to himself or others, SI/HI/AVH or psychosis.  The patient was then discharged and the sister who is at bedside now took the patient to be hooked to be evaluated.  Apparently when patient got to be hooked he was "vomiting blood" and they sent him here for evaluation.  The patient states that he has had ongoing vomiting and dry heaving.  He denies abdominal pain.  Review of Systems  Positive: See above Negative:   Physical Exam  BP (!) 138/94 (BP Location: Right Arm)    Pulse 66    Temp 97.6 F (36.4 C) (Oral)    Resp 18    SpO2 100%  Gen:   Awake, no distress   Resp:  Normal effort  MSK:   Moves extremities without difficulty  Other:    Medical Decision Making  Medically screening exam initiated at 6:45 PM.  Appropriate orders placed.  Mark Thornton was informed that the remainder of the evaluation will be completed by another provider, this initial triage assessment does not replace that evaluation, and the importance of remaining in the ED until their evaluation is complete.     Mark Hillier, PA-C 02/24/21 1847

## 2021-02-24 NOTE — ED Notes (Signed)
Back from CT

## 2021-02-24 NOTE — ED Notes (Signed)
Pt's O2 sats noted to be 80% on RA w/ a RR of 6.  2L O2 via [place] placed on pt w/ improvement of O2 sat to 96%.  O2 decreased to 1L via .

## 2021-02-24 NOTE — ED Notes (Signed)
Called lab for a 2nd time to have Troponin added on.

## 2021-02-24 NOTE — ED Provider Notes (Signed)
4 PM.  02/24/2021.  Patient has been discharged, without having first examination done tomorrow versus IVC paperwork.  I have communicated with Dr. Audrie Lia regarding his mental status at the time of disposition.  He did not meet criteria for involuntary commitment.  He has been discharged, with instructions for follow-up care.   Mancel Bale, MD 02/24/21 2302

## 2021-02-24 NOTE — ED Provider Notes (Signed)
°  Face-to-face evaluation   History: He arrives for evaluation of vomiting that started spontaneously as he was being evaluated at the [facility].  His family member took him there because he has ongoing substance abuse including alcohol and cocaine.  He states he has been crying because talking to his mother made him sad.  Physical exam: Alert, calm, cooperative.  No dysarthria or aphasia.  Oral mucous membranes are moist.  No respiratory distress.  He is lucid at this time.  No internal responsiveness.  MDM: Evaluation for  Chief Complaint  Patient presents with   Emesis     Patient with recurrent substance abuse of cocaine and alcohol presenting with vomiting which is nonspecific.  No overt signs of psychosis.  He is not responding to internal stimuli.  Medical screening examination/treatment/procedure(s) were conducted as a shared visit with non-physician practitioner(s) and myself.  I personally evaluated the patient during the encounter    Daleen Bo, MD 02/24/21 2310

## 2021-02-24 NOTE — H&P (Signed)
Behavioral Health Medical Screening Exam  Mark Thornton is an 51 y.o. male with past history of psychosis affective disorder, intermittent explosive disorder, alcohol abuse, who presented to [facility] voluntarily with his sister for assessment. While completing assessment sheet in lobby patient began vomiting blood; vital signs stable, patient coherent and awake. EMS called to transport patient to ED for medical clearance and further stabilization. Report called to Eulis Foster, MD. EMTALA form completed   Total Time spent with patient: 15 minutes  Psychiatric Specialty Exam: Physical Exam Vitals reviewed.  HENT:     Head: Normocephalic.     Nose: Nose normal.  Cardiovascular:     Rate and Rhythm: Normal rate.     Pulses: Normal pulses.  Pulmonary:     Effort: Pulmonary effort is normal.  Abdominal:     Comments: Patient vomiting blood.   Musculoskeletal:        General: Normal range of motion.  Neurological:     Mental Status: He is alert.  Psychiatric:        Attention and Perception: Attention and perception normal.        Speech: Speech normal.        Behavior: Behavior is cooperative.   Review of Systems  Eyes: Negative.   Cardiovascular: Negative.   Gastrointestinal:  Positive for vomiting.  Endocrine: Negative.   Allergic/Immunologic: Negative.   Blood pressure (!) 161/113, pulse 79, temperature 98.1 F (36.7 C), temperature source Oral, resp. rate 14, height 6\' 2"  (1.88 m), weight 79.4 kg, SpO2 96 %.Body mass index is 22.47 kg/m. General Appearance: Casual Eye Contact:  NA Speech:  NA Volume:  Normal Mood:  NA Affect:  NA Thought Process:  NA Orientation:  NA Thought Content:  NA Suicidal Thoughts:   unable to assess due to need for transfer Homicidal Thoughts:    unable to assess due to need for transfer Memory:  NA Judgement:  NA Insight:  NA Psychomotor Activity:  NA Concentration: Concentration: Fair and Attention Span: Fair Recall:  NA Fund of  Knowledge:NA Language: NA Akathisia:  NA Handed:  Left AIMS (if indicated):    Assets:  Resilience Social Support Sleep:     Musculoskeletal: Strength & Muscle Tone: within normal limits Gait & Station: normal Patient leans: N/A  Blood pressure (!) 161/113, pulse 79, temperature 98.1 F (36.7 C), temperature source Oral, resp. rate 14, height 6\' 2"  (1.88 m), weight 79.4 kg, SpO2 96 %.  Recommendations: Based on my evaluation the patient appears to have an emergency medical condition for which I recommend the patient be transferred to the emergency department for further evaluation.  Inda Merlin, NP 02/24/2021, 5:50 PM

## 2021-02-24 NOTE — ED Notes (Signed)
Patient tolerated drinking fluids

## 2021-02-24 NOTE — ED Provider Notes (Signed)
°  Physical Exam  BP (!) 163/100    Pulse 78    Temp 97.6 F (36.4 C) (Oral)    Resp (!) 9    Ht 6\' 2"  (1.88 m)    Wt 79.4 kg    SpO2 94%    BMI 22.47 kg/m   Physical Exam Vitals and nursing note reviewed.  Constitutional:      General: He is not in acute distress.    Appearance: He is well-developed.  HENT:     Head: Normocephalic and atraumatic.  Eyes:     Conjunctiva/sclera: Conjunctivae normal.  Cardiovascular:     Rate and Rhythm: Normal rate and regular rhythm.     Heart sounds: No murmur heard. Pulmonary:     Effort: Pulmonary effort is normal. No respiratory distress.     Breath sounds: Normal breath sounds.  Abdominal:     Palpations: Abdomen is soft.     Tenderness: There is no abdominal tenderness.  Musculoskeletal:        General: No swelling.     Cervical back: Neck supple.  Skin:    General: Skin is warm and dry.     Capillary Refill: Capillary refill takes less than 2 seconds.  Neurological:     Mental Status: He is alert.  Psychiatric:        Mood and Affect: Mood normal.    Procedures  Procedures  ED Course / MDM   Clinical Course as of 02/24/21 1339  Sun Feb 24, 2021  0713 Metabolize to freedom, pending trop [MK]    Clinical Course User Index [MK] Opha Mcghee, Debe Coder, MD   Medical Decision Making  Patient received in signout.  Arrives after being knocked unconscious and found outside.  Arrives hypothermic.  Plan is for metabolization of underlying illicit substances and discharge if his mental status improves.  Patient was observed for approximately 8 hours here in the emergency department and his temperature returned to normal.  Patient able to eat and drink without difficulty here in the emergency department and is ambulating without they will see.  Patient then discharged.  There was an initial IVC filed for the patient with reasoning of "respondent is unconscious due to drinking alcohol and cocaine, he does this on a regular basis, he was taken to the  hospital due to not breathing".  It is true that the patient is a daily drug user, but he is not displaying any evidence of psychosis here in the emergency department and is breathing on his own without any difficulty.  At this time, patient does not meet IVC criteria and is safe for discharge.  He was given resources for substance abuse upon discharge.       Teressa Lower, MD 02/24/21 1348

## 2021-02-24 NOTE — ED Notes (Signed)
Bair hugger applied.

## 2021-02-24 NOTE — ED Triage Notes (Signed)
Patient brought in by sister. States they were over at [facility] to get patient help but sent patient here due to emesis. Sister states "he was not supposed to be discharged from here and someone dropped the ball."

## 2021-02-25 ENCOUNTER — Other Ambulatory Visit (HOSPITAL_COMMUNITY)
Admission: EM | Admit: 2021-02-25 | Discharge: 2021-03-01 | Disposition: A | Discharge status: Home / Self Care | Payer: No Payment, Other | Source: Ambulatory Visit | Attending: Psychiatry | Admitting: Psychiatry

## 2021-02-25 ENCOUNTER — Encounter (HOSPITAL_COMMUNITY): Payer: Self-pay | Admitting: Psychiatry

## 2021-02-25 DIAGNOSIS — F1994 Other psychoactive substance use, unspecified with psychoactive substance-induced mood disorder: Secondary | ICD-10-CM | POA: Diagnosis not present

## 2021-02-25 DIAGNOSIS — F1010 Alcohol abuse, uncomplicated: Secondary | ICD-10-CM | POA: Diagnosis not present

## 2021-02-25 DIAGNOSIS — Z79899 Other long term (current) drug therapy: Secondary | ICD-10-CM | POA: Insufficient documentation

## 2021-02-25 DIAGNOSIS — F17210 Nicotine dependence, cigarettes, uncomplicated: Secondary | ICD-10-CM | POA: Insufficient documentation

## 2021-02-25 DIAGNOSIS — Z20822 Contact with and (suspected) exposure to covid-19: Secondary | ICD-10-CM | POA: Diagnosis not present

## 2021-02-25 DIAGNOSIS — I10 Essential (primary) hypertension: Secondary | ICD-10-CM | POA: Insufficient documentation

## 2021-02-25 LAB — LIPID PANEL
Cholesterol: 200 mg/dL (ref 0–200)
HDL: 70 mg/dL (ref >40)
LDL Cholesterol: 120 mg/dL — ABNORMAL HIGH (ref 0–99)
Total CHOL/HDL Ratio: 2.9 RATIO
Triglycerides: 48 mg/dL (ref <150)
VLDL: 10 mg/dL (ref 0–40)

## 2021-02-25 LAB — URINALYSIS, ROUTINE W REFLEX MICROSCOPIC
Bilirubin Urine: NEGATIVE
Glucose, UA: NEGATIVE mg/dL
Hgb urine dipstick: NEGATIVE
Ketones, ur: 80 mg/dL — AB
Leukocytes,Ua: NEGATIVE
Nitrite: NEGATIVE
Protein, ur: 100 mg/dL — AB
Specific Gravity, Urine: 1.032 — ABNORMAL HIGH (ref 1.005–1.030)
pH: 5 (ref 5.0–8.0)

## 2021-02-25 LAB — ETHANOL: Alcohol, Ethyl (B): <10 mg/dL (ref <10)

## 2021-02-25 LAB — TSH: TSH: 0.839 u[IU]/mL (ref 0.350–4.500)

## 2021-02-25 LAB — POCT URINE DRUG SCREEN - MANUAL ENTRY (I-SCREEN)
POC Amphetamine UR: POSITIVE — AB
POC Buprenorphine (BUP): NOT DETECTED
POC Cocaine UR: POSITIVE — AB
POC Marijuana UR: NOT DETECTED
POC Methadone UR: NOT DETECTED
POC Methamphetamine UR: POSITIVE — AB
POC Morphine: NOT DETECTED
POC Oxazepam (BZO): NOT DETECTED
POC Oxycodone UR: NOT DETECTED
POC Secobarbital (BAR): NOT DETECTED

## 2021-02-25 LAB — RESP PANEL BY RT-PCR (FLU A&B, COVID) ARPGX2
Influenza A by PCR: NEGATIVE
Influenza A by PCR: NEGATIVE
Influenza B by PCR: NEGATIVE
Influenza B by PCR: NEGATIVE
SARS Coronavirus 2 by RT PCR: NEGATIVE
SARS Coronavirus 2 by RT PCR: NEGATIVE

## 2021-02-25 LAB — HEMOGLOBIN A1C
Hgb A1c MFr Bld: 4.9 % (ref 4.8–5.6)
Mean Plasma Glucose: 93.93 mg/dL

## 2021-02-25 LAB — POC SARS CORONAVIRUS 2 AG: SARSCOV2ONAVIRUS 2 AG: NEGATIVE

## 2021-02-25 MED ORDER — LORAZEPAM 1 MG PO TABS
1.0000 mg | ORAL_TABLET | Freq: Three times a day (TID) | ORAL | Status: AC
  Administered 2021-02-26 – 2021-02-27 (×3): 1 mg via ORAL
  Filled 2021-02-25 (×3): qty 1

## 2021-02-25 MED ORDER — TRAZODONE HCL 50 MG PO TABS
50.0000 mg | ORAL_TABLET | Freq: Every evening | ORAL | Status: DC | PRN
  Administered 2021-02-27 – 2021-02-28 (×2): 50 mg via ORAL
  Filled 2021-02-25 (×2): qty 1

## 2021-02-25 MED ORDER — ONDANSETRON 8 MG PO TBDP: 8.0000 mg | ORAL_TABLET | Freq: Once | ORAL | Status: DC

## 2021-02-25 MED ORDER — MAGNESIUM HYDROXIDE 400 MG/5ML PO SUSP: 30.0000 mL | Freq: Every day | ORAL | Status: DC | PRN

## 2021-02-25 MED ORDER — ALUM & MAG HYDROXIDE-SIMETH 200-200-20 MG/5ML PO SUSP: 30.0000 mL | ORAL | Status: DC | PRN

## 2021-02-25 MED ORDER — HYDROXYZINE HCL 25 MG PO TABS: 25.0000 mg | ORAL_TABLET | Freq: Four times a day (QID) | ORAL | Status: AC | PRN

## 2021-02-25 MED ORDER — LORAZEPAM 1 MG PO TABS
1.0000 mg | ORAL_TABLET | Freq: Four times a day (QID) | ORAL | Status: AC
  Administered 2021-02-25 – 2021-02-26 (×3): 1 mg via ORAL
  Filled 2021-02-25 (×3): qty 1

## 2021-02-25 MED ORDER — LORAZEPAM 1 MG PO TABS: 1.0000 mg | ORAL_TABLET | Freq: Four times a day (QID) | ORAL | Status: AC | PRN

## 2021-02-25 MED ORDER — ACETAMINOPHEN 325 MG PO TABS: 650.0000 mg | ORAL_TABLET | Freq: Four times a day (QID) | ORAL | Status: DC | PRN

## 2021-02-25 MED ORDER — ONDANSETRON 4 MG PO TBDP: 4.0000 mg | ORAL_TABLET | Freq: Four times a day (QID) | ORAL | Status: AC | PRN

## 2021-02-25 MED ORDER — LORAZEPAM 1 MG PO TABS
1.0000 mg | ORAL_TABLET | Freq: Two times a day (BID) | ORAL | Status: AC
  Administered 2021-02-27 – 2021-02-28 (×2): 1 mg via ORAL
  Filled 2021-02-25 (×2): qty 1

## 2021-02-25 MED ORDER — NICOTINE 21 MG/24HR TD PT24
21.0000 mg | MEDICATED_PATCH | Freq: Every day | TRANSDERMAL | Status: DC
  Administered 2021-02-26 – 2021-03-01 (×4): 21 mg via TRANSDERMAL
  Filled 2021-02-25: qty 1
  Filled 2021-02-25: qty 20
  Filled 2021-02-25 (×3): qty 1

## 2021-02-25 MED ORDER — THIAMINE HCL 100 MG PO TABS
100.0000 mg | ORAL_TABLET | Freq: Every day | ORAL | Status: DC
  Administered 2021-02-26 – 2021-03-01 (×4): 100 mg via ORAL
  Filled 2021-02-25 (×3): qty 1
  Filled 2021-02-25: qty 20
  Filled 2021-02-25: qty 1

## 2021-02-25 MED ORDER — LORAZEPAM 1 MG PO TABS
1.0000 mg | ORAL_TABLET | Freq: Every day | ORAL | Status: AC
  Administered 2021-03-01: 1 mg via ORAL
  Filled 2021-02-25: qty 1

## 2021-02-25 MED ORDER — LOPERAMIDE HCL 2 MG PO CAPS: 2.0000 mg | ORAL_CAPSULE | ORAL | Status: AC | PRN

## 2021-02-25 MED ORDER — THIAMINE HCL 100 MG/ML IJ SOLN
100.0000 mg | Freq: Once | INTRAMUSCULAR | Status: AC
  Administered 2021-02-25: 100 mg via INTRAMUSCULAR
  Filled 2021-02-25: qty 2

## 2021-02-25 MED ORDER — ADULT MULTIVITAMIN W/MINERALS CH
1.0000 | ORAL_TABLET | Freq: Every day | ORAL | Status: DC
  Administered 2021-02-25 – 2021-03-01 (×5): 1 via ORAL
  Filled 2021-02-25: qty 1
  Filled 2021-02-25: qty 20
  Filled 2021-02-25 (×4): qty 1

## 2021-02-25 NOTE — ED Notes (Signed)
Patient was admitted to [facility].  He was cooperative but sedated from the ativan taper which was started earlier today due to alcohol use.  Patient was shown the unit briefly and was escorted to his room.  Patient encouraged to drink water and to rest .  He presently denies avh shi or plan.  Will monitor and provide safety while on unit.  Will monitor for etoh withdrawal symptoms.

## 2021-02-25 NOTE — Progress Notes (Signed)
°   02/25/21 1144  [place] (Walk-ins at [facility] only)  How Did You Hear About [place]? Self  What Is the Reason for Your Visit/Call Today? 51 year old present to [facility] with stating he needs help with his addiction to alcohol and crack cocaine. Patient dranks beer daily at least a 12-pack and reports smokes crack cocaine throughout the week and every weekend. Last use of alcohol and crack cocaine past weekend. Patient seen at [facility] and [facility] on 02/24/2021 but was discharged. Patient denies suicidal / homicidal ideations and denies auditory / visual hallucinations. Denied feelings of paranoia. Report onset of withdrawls symptoms shaking and hot sweets.  How Long Has This Been Causing You Problems? <Week  Have You Recently Had Any Thoughts About Hurting Yourself? No  Are You Planning to Commit Suicide/Harm Yourself At This time? No  Have you Recently Had Thoughts About [place]? No  Are You Planning To Harm Someone At This Time? No  Are you currently experiencing any auditory, visual or other hallucinations? No  Have You Used Any Alcohol or Drugs in the Past 24 Hours? Yes  How long ago did you use Drugs or Alcohol? 02/24/2021  What Did You Use and How Much? unknown amount  Do you have any current medical co-morbidities that require immediate attention? No  Clinician description of patient physical appearance/behavior: dressed appropriately for the weather.  What Do You Feel Would Help You the Most Today? Alcohol or Drug Use Treatment  If access to [facility] was not available, would you have sought care in the Emergency Department? Yes  Determination of Need Routine (7 days)  Options For Referral Chemical Dependency Intensive Outpatient Therapy (CDIOP)

## 2021-02-25 NOTE — Progress Notes (Signed)
atient is A&O X4 . Denied SI, HI, AVH.  Cooperative with admission process.  Skin check completed and belongings secured in locker.17  Patient oriented to unit and unit routines.  Offered food and fluids.  Will continue to monitor for safety.

## 2021-02-25 NOTE — ED Notes (Signed)
Pt asleep in bed. Respirations even and unlabored. Will continue to monitor for safety. ?

## 2021-02-25 NOTE — Progress Notes (Signed)
Patient appears to be sleeping.  Respirations even and unlabored.  Continue to monitor for safety. °

## 2021-02-25 NOTE — Progress Notes (Signed)
Report given to [facility].  [place], MHT, escorting patient over to [facility].

## 2021-02-25 NOTE — ED Provider Notes (Addendum)
Behavioral Health Admission H&P [facility] & OBS)  Date: 02/25/21 Patient Name: Mark Thornton MRN: [id number] Chief Complaint:  Chief Complaint  Patient presents with   Addiction Problem    51 year old Mark Thornton reports that he has an alcohol and crack cocaine problem. Report he has overdosed 5x times with the last overdose on 02/24/2021.       Diagnoses:  Final diagnoses:  Substance induced mood disorder ([place])  Alcohol abuse    HPI: patient presented to [facility] as a walk in with his mother with complaints of "I am ready to get off the alcohol and crack cocaine".  Mark Thornton, 51 y.o., male patient seen face to face by this provider, consulted with Dr. Serafina Mitchell; and chart reviewed on 02/25/21.  On evaluation Mark Thornton reports he has no psychiatric history. He denies any health concerns. He does not take any medications. He works part time and lives with his mother. He was seen at [facility] on 02/24/2020 after drinking a case of beer and smoking crack cocaine he "becoming non responsive". He declined substance treatment at that time. He returned to the [facility] on 02/25/2020 for intractable vomiting. He was discharged. He presents to [facility] today requesting substance abuse treatment. Reports he has drank "almost" daily for the past 20 years. He drinks a 12 pack of beers after he gets off work. He drinks till he passes out most days. On the weekend he drinks a case of beer per each day and smokes a "16" of crack cocaine. His last alcohol and crack cocaine use was 02/24/2020. He denies any history of seizures. He currently denies any withdrawal symptoms.    During evaluation Mark Thornton is in sitting position in no acute distress. He makes fair eye contact. Reports he is sleepy, he as not slept since 02/24/2020.  He is alert/oriented x 4 and cooperative. Reports depression and anxiety. He has lost 30 lbs over the past year due to decreased appetite.  He is speaking in a clear tone at  moderate volume, and normal pace. His thought process is coherent and relevant; There is no indication that he is currently responding to internal/external stimuli or experiencing delusional thought content; and he has denied self-harm/homicidal ideation, psychosis, and paranoia.  He denies SI at this time, but admits that recently he had some passive SI such as "what is the point in going on".     Discussed admission to the [facility] and expectations of the milieu. Patient agreed.         PHQ 2-9:   [place] ED from 02/24/2021 in [place] DEPT ED from 12/18/2020 in [place] DEPT ED from 11/25/2020 in [place] DEPT  C-SSRS RISK CATEGORY No Risk No Risk No Risk        Total Time spent with patient: 30 minutes  Musculoskeletal  Strength & Muscle Tone: within normal limits Gait & Station: normal Patient leans: N/A  Psychiatric Specialty Exam  Presentation General Appearance: Appropriate for Environment; Casual  Eye Contact:Fair  Speech:Clear and Coherent  Speech Volume:Normal  Handedness:Right   Mood and Affect  Mood:Depressed  Affect:Congruent   Thought Process  Thought Processes:Coherent  Descriptions of Associations:Intact  Orientation:Full (Time, Place and Person)  Thought Content:Logical    Hallucinations:Hallucinations: None  Ideas of Reference:None  Suicidal Thoughts:Suicidal Thoughts: No  Homicidal Thoughts:Homicidal Thoughts: No   Sensorium  Memory:Immediate Good; Recent Good; Remote Good  Judgment:Fair  Insight:Fair   Executive Functions  Concentration:Good  Attention Span:Good  Recall:Good  Fund of Knowledge:Good  Language:Good   Psychomotor Activity  Psychomotor Activity:Psychomotor Activity: Normal   Assets  Assets:Communication Skills; Desire for Improvement; Financial Resources/Insurance; Housing; Social Support   Sleep   Sleep:Sleep: Poor Number of Hours of Sleep: 0   Nutritional Assessment (For OBS and [facility] admissions only) Has the patient had a weight loss or gain of 10 pounds or more in the last 3 months?: No Has the patient had a decrease in food intake/or appetite?: Yes Does the patient have dental problems?: No Has the patient recently lost weight without trying?: 3 Has the patient been eating poorly because of a decreased appetite?: 1 Malnutrition Screening Tool Score: 4    Physical Exam Vitals and nursing note reviewed.  Constitutional:      General: He is not in acute distress.    Appearance: Normal appearance. He is well-developed.  HENT:     Head: Normocephalic and atraumatic.  Eyes:     General:        Right eye: No discharge.        Left eye: No discharge.     Conjunctiva/sclera: Conjunctivae normal.  Cardiovascular:     Rate and Rhythm: Normal rate.  Pulmonary:     Effort: Pulmonary effort is normal. No respiratory distress.  Musculoskeletal:        General: Normal range of motion.     Cervical back: Normal range of motion.  Skin:    Coloration: Skin is not jaundiced or pale.  Neurological:     Mental Status: He is alert and oriented to person, place, and time.  Psychiatric:        Attention and Perception: Attention and perception normal.        Mood and Affect: Mood is anxious and depressed.        Speech: Speech normal.        Behavior: Behavior normal. Behavior is cooperative.        Thought Content: Thought content normal.        Cognition and Memory: Cognition normal.        Judgment: Judgment normal.   Review of Systems  Constitutional: Negative.   HENT: Negative.    Eyes: Negative.   Respiratory: Negative.    Cardiovascular: Negative.   Gastrointestinal: Negative.   Genitourinary: Negative.   Musculoskeletal: Negative.   Skin: Negative.   Neurological: Negative.   Psychiatric/Behavioral:  Positive for depression and substance abuse. The patient is  nervous/anxious.    Blood pressure 132/90, pulse 77, temperature 98.4 F (36.9 C), temperature source Oral, resp. rate 16, SpO2 100 %. There is no height or weight on file to calculate BMI.  Past Psychiatric History: substance abuse alcohol and crack-cocaine   Is the patient at risk to self? No  Has the patient been a risk to self in the past 6 months? No .    Has the patient been a risk to self within the distant past? No   Is the patient a risk to others? No   Has the patient been a risk to others in the past 6 months? No   Has the patient been a risk to others within the distant past? No   Past Medical History:  Past Medical History:  Diagnosis Date   Alcohol abuse     Past Surgical History:  Procedure Laterality Date   IRRIGATION AND DEBRIDEMENT ABSCESS N/A 08/14/2020   Procedure: IEXAM UNDER ANES. RRIGATION AND DEBRIDEMENT PERIRECTAL ABSCESS;  Surgeon:  Johnathan Hausen, MD;  Location: [facility];  Service: General;  Laterality: N/A;    Family History: No family history on file.  Social History:  Social History   Socioeconomic History   Marital status: Single    Spouse name: Not on file   Number of children: Not on file   Years of education: Not on file   Highest education level: Not on file  Occupational History   Not on file  Tobacco Use   Smoking status: Every Day    Types: Cigarettes   Smokeless tobacco: Never  Substance and Sexual Activity   Alcohol use: Yes    Comment: daily   Drug use: Yes    Types: IV, Cocaine   Sexual activity: Not on file  Other Topics Concern   Not on file  Social History Narrative   Not on file   Social Determinants of Health   Financial Resource Strain: Not on file  Food Insecurity: Not on file  Transportation Needs: Not on file  Physical Activity: Not on file  Stress: Not on file  Social Connections: Not on file  Intimate Partner Violence: Not on file    SDOH:  SDOH Screenings   Alcohol Screen: Not on file  Depression  (PHQ2-9): Not on file  Financial Resource Strain: Not on file  Food Insecurity: Not on file  Housing: Not on file  Physical Activity: Not on file  Social Connections: Not on file  Stress: Not on file  Tobacco Use: High Risk   Smoking Tobacco Use: Every Day   Smokeless Tobacco Use: Never   Passive Exposure: Not on file  Transportation Needs: Not on file    Last Labs:  Admission on 02/24/2021, Discharged on 02/25/2021  Component Date Value Ref Range Status   Sodium 02/24/2021 136  135 - 145 mmol/L Final   Potassium 02/24/2021 4.5  3.5 - 5.1 mmol/L Final   DELTA CHECK NOTED   Chloride 02/24/2021 97 (L)  98 - 111 mmol/L Final   CO2 02/24/2021 28  22 - 32 mmol/L Final   Glucose, Bld 02/24/2021 98  70 - 99 mg/dL Final   Glucose reference range applies only to samples taken after fasting for at least 8 hours.   BUN 02/24/2021 20  6 - 20 mg/dL Final   Creatinine, Ser 02/24/2021 0.86  0.61 - 1.24 mg/dL Final   Calcium 02/24/2021 9.7  8.9 - 10.3 mg/dL Final   Total Protein 02/24/2021 9.4 (H)  6.5 - 8.1 g/dL Final   Albumin 02/24/2021 4.8  3.5 - 5.0 g/dL Final   AST 02/24/2021 31  15 - 41 U/L Final   ALT 02/24/2021 21  0 - 44 U/L Final   Alkaline Phosphatase 02/24/2021 60  38 - 126 U/L Final   Total Bilirubin 02/24/2021 1.7 (H)  0.3 - 1.2 mg/dL Final   GFR, Estimated 02/24/2021 >60  >60 mL/min Final   Comment: (NOTE) Calculated using the CKD-EPI Creatinine Equation (2021)    Anion gap 02/24/2021 11  5 - 15 Final   Performed at [facility], [place] [address], [place], [place] [address]   Lipase 02/24/2021 24  11 - 51 U/L Final   Performed at [facility], [place] [address], [place], [place] [address]   WBC 02/24/2021 4.7  4.0 - 10.5 K/uL Final   RBC 02/24/2021 4.89  4.22 - 5.81 MIL/uL Final   Hemoglobin 02/24/2021 14.5  13.0 - 17.0 g/dL Final   HCT 02/24/2021 41.7  39.0 - 52.0 % Final   MCV 02/24/2021 85.3  80.0 - 100.0 fL Final   MCH 02/24/2021 29.7   26.0 - 34.0 pg Final   MCHC 02/24/2021 34.8  30.0 - 36.0 g/dL Final   RDW 02/24/2021 11.9  11.5 - 15.5 % Final   Platelets 02/24/2021 331  150 - 400 K/uL Final   nRBC 02/24/2021 0.0  0.0 - 0.2 % Final   Neutrophils Relative % 02/24/2021 62  % Final   Neutro Abs 02/24/2021 2.9  1.7 - 7.7 K/uL Final   Lymphocytes Relative 02/24/2021 24  % Final   Lymphs Abs 02/24/2021 1.2  0.7 - 4.0 K/uL Final   Monocytes Relative 02/24/2021 12  % Final   Monocytes Absolute 02/24/2021 0.6  0.1 - 1.0 K/uL Final   Eosinophils Relative 02/24/2021 2  % Final   Eosinophils Absolute 02/24/2021 0.1  0.0 - 0.5 K/uL Final   Basophils Relative 02/24/2021 0  % Final   Basophils Absolute 02/24/2021 0.0  0.0 - 0.1 K/uL Final   Immature Granulocytes 02/24/2021 0  % Final   Abs Immature Granulocytes 02/24/2021 0.01  0.00 - 0.07 K/uL Final   Performed at [facility], [place] [address] [place], [place] [address]   Color, Urine 02/25/2021 AMBER (A)  YELLOW Final   BIOCHEMICALS MAY BE AFFECTED BY COLOR   APPearance 02/25/2021 CLEAR  CLEAR Final   Specific Gravity, Urine 02/25/2021 1.032 (H)  1.005 - 1.030 Final   pH 02/25/2021 5.0  5.0 - 8.0 Final   Glucose, UA 02/25/2021 NEGATIVE  NEGATIVE mg/dL Final   Hgb urine dipstick 02/25/2021 NEGATIVE  NEGATIVE Final   Bilirubin Urine 02/25/2021 NEGATIVE  NEGATIVE Final   Ketones, ur 02/25/2021 80 (A)  NEGATIVE mg/dL Final   Protein, ur 02/25/2021 100 (A)  NEGATIVE mg/dL Final   Nitrite 02/25/2021 NEGATIVE  NEGATIVE Final   Leukocytes,Ua 02/25/2021 NEGATIVE  NEGATIVE Final   RBC / HPF 02/25/2021 0-5  0 - 5 RBC/hpf Final   WBC, UA 02/25/2021 0-5  0 - 5 WBC/hpf Final   Bacteria, UA 02/25/2021 RARE (A)  NONE SEEN Final   Squamous Epithelial / LPF 02/25/2021 0-5  0 - 5 Final   Mucus 02/25/2021 PRESENT   Final   Hyaline Casts, UA 02/25/2021 PRESENT   Final   Granular Casts, UA 02/25/2021 PRESENT   Final   Performed at [facility], [place]  [address], [place], [place] [address]   SARS Coronavirus 2 by RT PCR 02/25/2021 NEGATIVE  NEGATIVE Final   Comment: (NOTE) SARS-CoV-2 target nucleic acids are NOT DETECTED.  The SARS-CoV-2 RNA is generally detectable in upper respiratory specimens during the acute phase of infection. The lowest concentration of SARS-CoV-2 viral copies this assay can detect is 138 copies/mL. A negative result does not preclude SARS-Cov-2 infection and should not be used as the sole basis for treatment or other patient management decisions. A negative result may occur with  improper specimen collection/handling, submission of specimen other than nasopharyngeal swab, presence of viral mutation(s) within the areas targeted by this assay, and inadequate number of viral copies(<138 copies/mL). A negative result must be combined with clinical observations, patient history, and epidemiological information. The expected result is Negative.  Fact Sheet for Patients:  EntrepreneurPulse.com.au  Fact Sheet for Healthcare Providers:  IncredibleEmployment.be  This test is no  t yet approved or cleared by the [place] and  has been authorized for detection and/or diagnosis of SARS-CoV-2 by FDA under an Emergency Use Authorization (EUA). This EUA will remain  in effect (meaning this test can be used) for the duration of the COVID-19 declaration under Section 564(b)(1) of the Act, 21 U.S.C.section 360bbb-3(b)(1), unless the authorization is terminated  or revoked sooner.       Influenza A by PCR 02/25/2021 NEGATIVE  NEGATIVE Final   Influenza B by PCR 02/25/2021 NEGATIVE  NEGATIVE Final   Comment: (NOTE) The Xpert Xpress SARS-CoV-2/FLU/RSV plus assay is intended as an aid in the diagnosis of influenza from Nasopharyngeal swab specimens and should not be used as a sole basis for treatment. Nasal washings and aspirates are unacceptable for Xpert  Xpress SARS-CoV-2/FLU/RSV testing.  Fact Sheet for Patients: EntrepreneurPulse.com.au  Fact Sheet for Healthcare Providers: IncredibleEmployment.be  This test is not yet approved or cleared by the [place] FDA and has been authorized for detection and/or diagnosis of SARS-CoV-2 by FDA under an Emergency Use Authorization (EUA). This EUA will remain in effect (meaning this test can be used) for the duration of the COVID-19 declaration under Section 564(b)(1) of the Act, 21 U.S.C. section 360bbb-3(b)(1), unless the authorization is terminated or revoked.  Performed at [facility], [place] [address], [place], [place] [address]   Admission on 02/24/2021, Discharged on 02/24/2021  Component Date Value Ref Range Status   Glucose-Capillary 02/24/2021 110 (H)  70 - 99 mg/dL Final   Glucose reference range applies only to samples taken after fasting for at least 8 hours.   WBC 02/24/2021 6.3  4.0 - 10.5 K/uL Final   RBC 02/24/2021 4.58  4.22 - 5.81 MIL/uL Final   Hemoglobin 02/24/2021 13.6  13.0 - 17.0 g/dL Final   HCT 02/24/2021 39.4  39.0 - 52.0 % Final   MCV 02/24/2021 86.0  80.0 - 100.0 fL Final   MCH 02/24/2021 29.7  26.0 - 34.0 pg Final   MCHC 02/24/2021 34.5  30.0 - 36.0 g/dL Final   RDW 02/24/2021 11.9  11.5 - 15.5 % Final   Platelets 02/24/2021 313  150 - 400 K/uL Final   nRBC 02/24/2021 0.0  0.0 - 0.2 % Final   Neutrophils Relative % 02/24/2021 62  % Final   Neutro Abs 02/24/2021 3.9  1.7 - 7.7 K/uL Final   Lymphocytes Relative 02/24/2021 25  % Final   Lymphs Abs 02/24/2021 1.6  0.7 - 4.0 K/uL Final   Monocytes Relative 02/24/2021 10  % Final   Monocytes Absolute 02/24/2021 0.7  0.1 - 1.0 K/uL Final   Eosinophils Relative 02/24/2021 1  % Final   Eosinophils Absolute 02/24/2021 0.1  0.0 - 0.5 K/uL Final   Basophils Relative 02/24/2021 1  % Final   Basophils Absolute 02/24/2021 0.0  0.0 - 0.1 K/uL Final   Immature  Granulocytes 02/24/2021 1  % Final   Abs Immature Granulocytes 02/24/2021 0.04  0.00 - 0.07 K/uL Final   Performed at [facility], [place] [address] [place], [place] [address]   Alcohol, Ethyl (B) 02/24/2021 <10  <10 mg/dL Final   Comment: (NOTE) Lowest detectable limit for serum alcohol is 10 mg/dL.  For medical purposes only. Performed at [facility], [place] [address] [place] [address] [place], [place] [address]    Sodium 02/24/2021 137  135 - 145 mmol/L Final   Potassium 02/24/2021 3.4 (L)  3.5 - 5.1 mmol/L Final  Chloride 02/24/2021 99  98 - 111 mmol/L Final   CO2 02/24/2021 27  22 - 32 mmol/L Final   Glucose, Bld 02/24/2021 125 (H)  70 - 99 mg/dL Final   Glucose reference range applies only to samples taken after fasting for at least 8 hours.   BUN 02/24/2021 20  6 - 20 mg/dL Final   Creatinine, Ser 02/24/2021 0.83  0.61 - 1.24 mg/dL Final   Calcium 02/24/2021 9.4  8.9 - 10.3 mg/dL Final   Total Protein 02/24/2021 9.2 (H)  6.5 - 8.1 g/dL Final   Albumin 02/24/2021 4.8  3.5 - 5.0 g/dL Final   AST 02/24/2021 31  15 - 41 U/L Final   ALT 02/24/2021 21  0 - 44 U/L Final   Alkaline Phosphatase 02/24/2021 61  38 - 126 U/L Final   Total Bilirubin 02/24/2021 1.5 (H)  0.3 - 1.2 mg/dL Final   GFR, Estimated 02/24/2021 >60  >60 mL/min Final   Comment: (NOTE) Calculated using the CKD-EPI Creatinine Equation (2021)    Anion gap 02/24/2021 11  5 - 15 Final   Performed at [facility], [place] [address] [place], [place] [address]   Troponin I (High Sensitivity) 02/24/2021 4  <18 ng/L Final   Comment: (NOTE) Elevated high sensitivity troponin I (hsTnI) values and significant  changes across serial measurements may suggest ACS but many other  chronic and acute conditions are known to elevate hsTnI results.  Refer to the "Links" section for chest pain algorithms and additional  guidance. Performed at [facility], [place] [address], [place], [place] [address]   Admission on 12/18/2020, Discharged on 12/18/2020  Component Date Value Ref Range Status   WBC 12/18/2020 6.1  4.0 - 10.5 K/uL Final   RBC 12/18/2020 5.16  4.22 - 5.81 MIL/uL Final   Hemoglobin 12/18/2020 15.0  13.0 - 17.0 g/dL Final   HCT 12/18/2020 44.2  39.0 - 52.0 % Final   MCV 12/18/2020 85.7  80.0 - 100.0 fL Final   MCH 12/18/2020 29.1  26.0 - 34.0 pg Final   MCHC 12/18/2020 33.9  30.0 - 36.0 g/dL Final   RDW 12/18/2020 12.7  11.5 - 15.5 % Final   Platelets 12/18/2020 291  150 - 400 K/uL Final   nRBC 12/18/2020 0.0  0.0 - 0.2 % Final   Performed at [facility], [place] [address], [place], [place] [address]   Sodium 12/18/2020 134 (L)  135 - 145 mmol/L Final   Potassium 12/18/2020 3.4 (L)  3.5 - 5.1 mmol/L Final   Chloride 12/18/2020 94 (L)  98 - 111 mmol/L Final   CO2 12/18/2020 30  22 - 32 mmol/L Final   Glucose, Bld 12/18/2020 158 (H)  70 - 99 mg/dL Final   Glucose reference range applies only to samples taken after fasting for at least 8 hours.   BUN 12/18/2020 11  6 - 20 mg/dL Final   Creatinine, Ser 12/18/2020 0.76  0.61 - 1.24 mg/dL Final   Calcium 12/18/2020 9.6  8.9 - 10.3 mg/dL Final   Total Protein 12/18/2020 8.4 (H)  6.5 - 8.1 g/dL Final   Albumin 12/18/2020 4.0  3.5 - 5.0 g/dL Final   AST 12/18/2020 209 (H)  15 - 41 U/L Final   ALT 12/18/2020 104 (H)  0 - 44 U/L Final   Alkaline Phosphatase 12/18/2020 61  38 - 126 U/L Final   Total Bilirubin 12/18/2020 1.0  0.3 - 1.2 mg/dL  Final   GFR, Estimated 12/18/2020 >60  >60 mL/min Final   Comment: (NOTE) Calculated using the CKD-EPI Creatinine Equation (2021)    Anion gap 12/18/2020 10  5 - 15 Final   Performed at [facility], [place] [address], [place], [place] [address]   Lipase 12/18/2020 38  11 - 51 U/L Final   Performed at [facility], [place] [address], [place], [place] [address]   SARS Coronavirus 2 by RT PCR 12/18/2020 NEGATIVE   NEGATIVE Final   Comment: (NOTE) SARS-CoV-2 target nucleic acids are NOT DETECTED.  The SARS-CoV-2 RNA is generally detectable in upper respiratory specimens during the acute phase of infection. The lowest concentration of SARS-CoV-2 viral copies this assay can detect is 138 copies/mL. A negative result does not preclude SARS-Cov-2 infection and should not be used as the sole basis for treatment or other patient management decisions. A negative result may occur with  improper specimen collection/handling, submission of specimen other than nasopharyngeal swab, presence of viral mutation(s) within the areas targeted by this assay, and inadequate number of viral copies(<138 copies/mL). A negative result must be combined with clinical observations, patient history, and epidemiological information. The expected result is Negative.  Fact Sheet for Patients:  EntrepreneurPulse.com.au  Fact Sheet for Healthcare Providers:  IncredibleEmployment.be  This test is no                          t yet approved or cleared by the [place] FDA and  has been authorized for detection and/or diagnosis of SARS-CoV-2 by FDA under an Emergency Use Authorization (EUA). This EUA will remain  in effect (meaning this test can be used) for the duration of the COVID-19 declaration under Section 564(b)(1) of the Act, 21 U.S.C.section 360bbb-3(b)(1), unless the authorization is terminated  or revoked sooner.       Influenza A by PCR 12/18/2020 NEGATIVE  NEGATIVE Final   Influenza B by PCR 12/18/2020 NEGATIVE  NEGATIVE Final   Comment: (NOTE) The Xpert Xpress SARS-CoV-2/FLU/RSV plus assay is intended as an aid in the diagnosis of influenza from Nasopharyngeal swab specimens and should not be used as a sole basis for treatment. Nasal washings and aspirates are unacceptable for Xpert Xpress SARS-CoV-2/FLU/RSV testing.  Fact Sheet for  Patients: EntrepreneurPulse.com.au  Fact Sheet for Healthcare Providers: IncredibleEmployment.be  This test is not yet approved or cleared by the [place] FDA and has been authorized for detection and/or diagnosis of SARS-CoV-2 by FDA under an Emergency Use Authorization (EUA). This EUA will remain in effect (meaning this test can be used) for the duration of the COVID-19 declaration under Section 564(b)(1) of the Act, 21 U.S.C. section 360bbb-3(b)(1), unless the authorization is terminated or revoked.  Performed at [facility], [place] [address], [place], [place] [address]    Color, Urine 12/18/2020 YELLOW  YELLOW Final   APPearance 12/18/2020 CLEAR  CLEAR Final   Specific Gravity, Urine 12/18/2020 1.018  1.005 - 1.030 Final   pH 12/18/2020 5.0  5.0 - 8.0 Final   Glucose, UA 12/18/2020 NEGATIVE  NEGATIVE mg/dL Final   Hgb urine dipstick 12/18/2020 SMALL (A)  NEGATIVE Final   Bilirubin Urine 12/18/2020 NEGATIVE  NEGATIVE Final   Ketones, ur 12/18/2020 20 (A)  NEGATIVE mg/dL Final   Protein, ur 12/18/2020 NEGATIVE  NEGATIVE mg/dL Final   Nitrite 12/18/2020 NEGATIVE  NEGATIVE Final   Leukocytes,Ua 12/18/2020 NEGATIVE  NEGATIVE Final   RBC /  HPF 12/18/2020 0-5  0 - 5 RBC/hpf Final   WBC, UA 12/18/2020 0-5  0 - 5 WBC/hpf Final   Bacteria, UA 12/18/2020 RARE (A)  NONE SEEN Final   Mucus 12/18/2020 PRESENT   Final   Performed at [facility], [place] [address], [place], [place] [address]   Hepatitis B Surface Ag 12/18/2020 NON REACTIVE  NON REACTIVE Final   HCV Ab 12/18/2020 Reactive (A)  NON REACTIVE Final   Comment: (NOTE) The CDC recommends that a Reactive HCV antibody result be followed up  with a HCV Nucleic Acid Amplification test.     Hep A IgM 12/18/2020 NON REACTIVE  NON REACTIVE Final   Hep B C IgM 12/18/2020 NON REACTIVE  NON REACTIVE Final   Performed at [facility], [place] [address]  [place], [place] [address]  Admission on 11/25/2020, Discharged on 11/25/2020  Component Date Value Ref Range Status   WBC 11/25/2020 4.2  4.0 - 10.5 K/uL Final   RBC 11/25/2020 4.70  4.22 - 5.81 MIL/uL Final   Hemoglobin 11/25/2020 13.9  13.0 - 17.0 g/dL Final   HCT 11/25/2020 40.7  39.0 - 52.0 % Final   MCV 11/25/2020 86.6  80.0 - 100.0 fL Final   MCH 11/25/2020 29.6  26.0 - 34.0 pg Final   MCHC 11/25/2020 34.2  30.0 - 36.0 g/dL Final   RDW 11/25/2020 12.6  11.5 - 15.5 % Final   Platelets 11/25/2020 321  150 - 400 K/uL Final   nRBC 11/25/2020 0.0  0.0 - 0.2 % Final   Neutrophils Relative % 11/25/2020 56  % Final   Neutro Abs 11/25/2020 2.4  1.7 - 7.7 K/uL Final   Lymphocytes Relative 11/25/2020 33  % Final   Lymphs Abs 11/25/2020 1.4  0.7 - 4.0 K/uL Final   Monocytes Relative 11/25/2020 9  % Final   Monocytes Absolute 11/25/2020 0.4  0.1 - 1.0 K/uL Final   Eosinophils Relative 11/25/2020 1  % Final   Eosinophils Absolute 11/25/2020 0.0  0.0 - 0.5 K/uL Final   Basophils Relative 11/25/2020 1  % Final   Basophils Absolute 11/25/2020 0.0  0.0 - 0.1 K/uL Final   Immature Granulocytes 11/25/2020 0  % Final   Abs Immature Granulocytes 11/25/2020 0.01  0.00 - 0.07 K/uL Final   Performed at [facility], [place] [address], [place], [place] [address]   Sodium 11/25/2020 138  135 - 145 mmol/L Final   Potassium 11/25/2020 3.6  3.5 - 5.1 mmol/L Final   Chloride 11/25/2020 105  98 - 111 mmol/L Final   CO2 11/25/2020 24  22 - 32 mmol/L Final   Glucose, Bld 11/25/2020 120 (H)  70 - 99 mg/dL Final   Glucose reference range applies only to samples taken after fasting for at least 8 hours.   BUN 11/25/2020 6  6 - 20 mg/dL Final   Creatinine, Ser 11/25/2020 0.76  0.61 - 1.24 mg/dL Final   Calcium 11/25/2020 8.2 (L)  8.9 - 10.3 mg/dL Final   Total Protein 11/25/2020 8.2 (H)  6.5 - 8.1 g/dL Final   Albumin 11/25/2020 3.9  3.5 - 5.0 g/dL Final   AST 11/25/2020 24  15 - 41 U/L Final   ALT  11/25/2020 15  0 - 44 U/L Final   Alkaline Phosphatase 11/25/2020 63  38 - 126 U/L Final   Total Bilirubin 11/25/2020 0.6  0.3 - 1.2 mg/dL Final   GFR, Estimated 11/25/2020 >60  >  60 mL/min Final   Comment: (NOTE) Calculated using the CKD-EPI Creatinine Equation (2021)    Anion gap 11/25/2020 9  5 - 15 Final   Performed at [facility], [place] [address] [place], [place] [address]   Alcohol, Ethyl (B) 11/25/2020 335 (HH)  <10 mg/dL Final   Comment: CRITICAL RESULT CALLED TO, READ BACK BY AND VERIFIED WITH: SOUTHARD, RN @ [phone] ON 11/25/2020 BY LBROOKS, MLT (NOTE) Lowest detectable limit for serum alcohol is 10 mg/dL.  For medical purposes only. Performed at [facility], [place] [address] [place], [place] [id number]    Salicylate Lvl 123456 <7.0 (L)  7.0 - 30.0 mg/dL Final   Performed at [place] [address] [place], [place] [address]   Acetaminophen (Tylenol), Serum 11/25/2020 <10 (L)  10 - 30 ug/mL Final   Comment: (NOTE) Therapeutic concentrations vary significantly. A range of 10-30 ug/mL  may be an effective concentration for many patients. However, some  are best treated at concentrations outside of this range. Acetaminophen concentrations >150 ug/mL at 4 hours after ingestion  and >50 ug/mL at 12 hours after ingestion are often associated with  toxic reactions.  Performed at [facility], [place] [address] [place], [place] [address]     Allergies: Patient has no known allergies.  PTA Medications: (Not in a hospital admission)   Medical Decision Making  Patient presents to [facility] requesting assistance with substance abuse alcohol and crack-cocaine. Recently he has had passive suicidal ideations with no plan, but is not currently SI. He denies HI/AVH. He meets criteria for treatment in the [facility].  Discussed expectations upon admission for [facility] patient agreed.     Recommendations  Based on  my evaluation the patient does not appear to have an emergency medical condition.  Admit to [facility].   Lab work ordered:  ethanol, Hemoglobin A1C,  Lipid Panel, RPR, TSH. UDS Covid POC and PCR. See lab work From [facility] on 1/8 for other Lab work.  CIWA protocol with scheduled ativan initiated.   Patient is interested in residential treatment.   Revonda Humphrey, NP 02/25/21  2:06 PM

## 2021-02-25 NOTE — ED Notes (Signed)
Patient declined to attend wrap up group due to him wanting to sleep, even after encouragement from staff to come to group.

## 2021-02-25 NOTE — Discharge Instructions (Signed)
Please call the behavioral health urgent care and schedule a new appointment for reevaluation.  Their contact information is below.  If you develop any new or worsening symptoms please come back to the emergency department.

## 2021-02-26 DIAGNOSIS — F17210 Nicotine dependence, cigarettes, uncomplicated: Secondary | ICD-10-CM | POA: Diagnosis not present

## 2021-02-26 DIAGNOSIS — F1010 Alcohol abuse, uncomplicated: Secondary | ICD-10-CM | POA: Diagnosis not present

## 2021-02-26 DIAGNOSIS — Z20822 Contact with and (suspected) exposure to covid-19: Secondary | ICD-10-CM | POA: Diagnosis not present

## 2021-02-26 DIAGNOSIS — F1994 Other psychoactive substance use, unspecified with psychoactive substance-induced mood disorder: Secondary | ICD-10-CM | POA: Diagnosis not present

## 2021-02-26 LAB — GC/CHLAMYDIA PROBE AMP (~~LOC~~) NOT AT ARMC
Chlamydia: NEGATIVE
Comment: NEGATIVE
Comment: NORMAL
Neisseria Gonorrhea: NEGATIVE

## 2021-02-26 LAB — RPR
RPR Ser Ql: REACTIVE — AB
RPR Titer: 1:1 {titer}

## 2021-02-26 NOTE — ED Notes (Signed)
Pt asleep in bed. Respirations even and unlabored. Will continue to monitor for safety. ?

## 2021-02-26 NOTE — Progress Notes (Signed)
Patient showered.

## 2021-02-26 NOTE — Progress Notes (Signed)
Patient appears to be sleeping.  Respirations even and unlabored.  Continue to monitor for safety. °

## 2021-02-26 NOTE — BH Assessment (Signed)
Comprehensive Clinical Assessment (CCA) Note  02/26/2021 Mark Thornton [id number]  Chief Complaint:  Chief Complaint  Patient presents with   Addiction Problem    51 year old Mark Thornton reports that he has an alcohol and crack cocaine problem. Report he has overdosed 5x times with the last overdose on 02/24/2021.    Visit Diagnosis:  Substance induced mood disorder (HCC) and Alcohol abuse  HPI: patient presented to [facility] as a walk in with his mother with complaints of "I am ready to get off the alcohol and crack cocaine".   Mark Thornton, 51 y.o., male patient seen face to face by this provider, consulted with Dr. Serafina Mitchell; and chart reviewed on 02/25/21.  On evaluation Mark Thornton reports he has no psychiatric history. He denies any health concerns. He does not take any medications. He works part time and lives with his mother. He was seen at [facility] on 02/24/2020 after drinking a case of beer and smoking crack cocaine he "becoming non responsive". He declined substance treatment at that time. He returned to the [facility] on 02/25/2020 for intractable vomiting. He was discharged. He presents to [facility] today requesting substance abuse treatment. Reports he has drank "almost" daily for the past 20 years. He drinks a 12 pack of beers after he gets off work. He drinks till he passes out most days. On the weekend he drinks a case of beer per each day and smokes a "16" of crack cocaine. His last alcohol and crack cocaine use was 02/24/2020. He denies any history of seizures. He currently denies any withdrawal symptoms.      During evaluation Mark Thornton is in sitting position in no acute distress. He makes fair eye contact. Reports he is sleepy, he as not slept since 02/24/2020.  He is alert/oriented x 4 and cooperative. Reports depression and anxiety. He has lost 30 lbs over the past year due to decreased appetite.  He is speaking in a clear tone at moderate volume, and normal pace. His  thought process is coherent and relevant; There is no indication that he is currently responding to internal/external stimuli or experiencing delusional thought content; and he has denied self-harm/homicidal ideation, psychosis, and paranoia.  He denies SI at this time, but admits that recently he had some passive SI such as "what is the point in going on".    - Per Mark Lolling, NP  CCA Screening, Triage and Referral (STR)  Patient Reported Information How did you hear about [place]? Hospital Discharge  What Is the Reason for Your Visit/Call Today? Patient requested he needs help with his addiction to alcohol and crack cocaine. Patient dranks beer daily at least a 12-pack and reports smokes crack cocaine throughout the week and every weekend.  How Long Has This Been Causing You Problems? > than 6 months  What Do You Feel Would Help You the Most Today? Alcohol or Drug Use Treatment   Have You Recently Had Any Thoughts About Hurting Yourself? No  Are You Planning to Commit Suicide/Harm Yourself At This time? No   Have you Recently Had Thoughts About [place]? No  Are You Planning to Harm Someone at This Time? No  Explanation: No data recorded  Have You Used Any Alcohol or Drugs in the Past 24 Hours? No  How Long Ago Did You Use Drugs or Alcohol? No data recorded What Did You Use and How Much? 02/23/2021   Do You Currently Have a Therapist/Psychiatrist? No  Name of Therapist/Psychiatrist: No data  recorded  Have You Been Recently Discharged From Any Office Practice or Programs? No  Explanation of Discharge From Practice/Program: No data recorded    CCA Screening Triage Referral Assessment Type of Contact: Face-to-Face  Telemedicine Service Delivery:   Is this Initial or Reassessment? No data recorded Date Telepsych consult ordered in [facility]:  No data recorded Time Telepsych consult ordered in [facility]:  No data recorded Location of Assessment: Other (comment)  ([facility])  Provider Location: Other (comment) ([facility])   Collateral Involvement: None   Does Patient Have a [place]? No data recorded Name and Contact of Legal Guardian: No data recorded If Minor and Not Living with Parent(s), Who has Custody? N/A  Is CPS involved or ever been involved? Never  Is APS involved or ever been involved? Never   Patient Determined To Be At Risk for Harm To Self or Others Based on Review of Patient Reported Information or Presenting Complaint? No  Method: No data recorded Availability of Means: No data recorded Intent: No data recorded Notification Required: No data recorded Additional Information for Danger to Others Potential: No data recorded Additional Comments for Danger to Others Potential: No data recorded Are There Guns or Other Weapons in Your Home? No data recorded Types of Guns/Weapons: No data recorded Are These Weapons Safely Secured?                            No data recorded Who Could Verify You Are Able To Have These Secured: No data recorded Do You Have any Outstanding Charges, Pending Court Dates, Parole/Probation? No data recorded Contacted To Inform of Risk of Harm To Self or Others: -- (N/A)   Does Patient Present under Involuntary Commitment? No  IVC Papers Initial File Date: No data recorded  [place] of Residence: [place]   Patient Currently Receiving the Following Services: -- (None)   Determination of Need: Urgent (48 hours)   Options For Referral: Facility-Based Crisis     CCA Biopsychosocial Patient Reported Schizophrenia/Schizoaffective Diagnosis in Past: No   Strengths: Patient unable to identify any strengths   Mental Health Symptoms Depression:   None   Duration of Depressive symptoms:    Mania:   None   Anxiety:    None   Psychosis:   None   Duration of Psychotic symptoms:    Trauma:   None   Obsessions:   None   Compulsions:   None   Inattention:    None   Hyperactivity/Impulsivity:   None   Oppositional/Defiant Behaviors:   None   Emotional Irregularity:   None   Other Mood/Personality Symptoms:  No data recorded   Mental Status Exam Appearance and self-care  Stature:   Tall   Weight:   Thin   Clothing:   Disheveled   Grooming:   Neglected   Cosmetic use:   None   Posture/gait:   Normal   Motor activity:   Not Remarkable   Sensorium  Attention:   Normal   Concentration:   Normal   Orientation:   Object; Person; Place; Situation; X5; Time   Recall/memory:   Normal   Affect and Mood  Affect:   Flat; Depressed   Mood:   Depressed   Relating  Eye contact:   Normal   Facial expression:   Sad   Attitude toward examiner:   Cooperative   Thought and Language  Speech flow:  Normal   Thought content:  Appropriate to Mood and Circumstances   Preoccupation:   None   Hallucinations:   None   Organization:  No data recorded  [employer] of Knowledge:   Fair   Intelligence:   Average   Abstraction:   Normal   Judgement:   Normal   Reality Testing:   Realistic   Insight:   Fair   Decision Making:   Normal   Social Functioning  Social Maturity:   Responsible   Social Judgement:   Normal   Stress  Stressors:   Other (Comment)   Coping Ability:   Normal   Skill Deficits:   None   Supports:   Family (Mother)     Religion: Religion/Spirituality Are You A Religious Person?: Yes What is Your Religious Affiliation?: Christian How Might This Affect Treatment?: None  Leisure/Recreation: Leisure / Recreation Do You Have Hobbies?: No  Exercise/Diet: Exercise/Diet Do You Exercise?: No Have You Gained or Lost A Significant Amount of Weight in the Past Six Months?: No Do You Follow a Special Diet?: No Do You Have Any Trouble Sleeping?: No   CCA Employment/Education Employment/Work Situation: Employment / Work Situation Employment  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)) Work Stressors: Patient denied having any work stressors Patient's Job has Been Impacted by Current Illness: Yes Describe how Patient's Job has Been Impacted: Chronic substance use impacts patient's ability to perfom; Attendance Has Patient ever Been in the [place]?: No  Education: Education Is Patient Currently Attending School?: No Last Grade Completed: 11 Did You Attend College?: No Did You Have An Individualized Education Program (IIEP): No Did You Have Any Difficulty At School?: No Patient's Education Has Been Impacted by Current Illness: No   CCA Family/Childhood History Family and Relationship History: Family history Marital status: Single  Childhood History:  Childhood History By whom was/is the patient raised?: Mother Did patient suffer any verbal/emotional/physical/sexual abuse as a child?: No Did patient suffer from severe childhood neglect?: No Has patient ever been sexually abused/assaulted/raped as an adolescent or adult?: No Was the patient ever a victim of a crime or a disaster?: No Witnessed domestic violence?: No Has patient been affected by domestic violence as an adult?: No  Child/Adolescent Assessment:     CCA Substance Use Alcohol/Drug Use: Alcohol / Drug Use Pain Medications: Patient denies Prescriptions: Patient denies Over the Counter: Patient denies History of alcohol / drug use?: Yes Longest period of sobriety (when/how long): Unknown Negative Consequences of Use:  (None reported) Withdrawal Symptoms: None Substance #1 Name of Substance 1: ETOH (Alcohol) 1 - Age of First Use: 51yo 1 - Amount (size/oz): 12 pack of beers 1 - Frequency: Daily 1 - Duration: 20+ years 1 - Last Use / Amount: 02/23/2021 - (12 pack of beers) 1 - Method of Aquiring: Purchasing from stores 1- Route of Use: Drinking/Oral Substance #2 Name of Substance 2: Crack cocaine 2 - Age of First Use:  51yo 2 - Amount (size/oz): $100 worth - patient refers to this amount as a "16" 2 - Frequency: Weekly (reports using only on the weekends) 2 - Duration: 20 + years 2 - Last Use / Amount: 02/23/2021 - ($100 worth 2 - Method of Aquiring: Purchasing from dealers 2 - Route of Substance Use: Smoking                     ASAM's:  Six Dimensions of Multidimensional Assessment  Dimension 1:  Acute Intoxication and/or Withdrawal Potential:  Dimension 1:  Description of individual's past and current experiences of substance use and withdrawal: Reports no history of withdrawals; Reports chronic substance use for 20+ years; Drugs of choice are crack cocaine and ETOH  Dimension 2:  Biomedical Conditions and Complications:   Dimension 2:  Description of patient's biomedical conditions and  complications: None reported  Dimension 3:  Emotional, Behavioral, or Cognitive Conditions and Complications:  Dimension 3:  Description of emotional, behavioral, or cognitive conditions and complications: None reported  Dimension 4:  Readiness to Change:  Dimension 4:  Description of Readiness to Change criteria: Patient requested assistance with being referred to a residential substance absue  Dimension 5:  Relapse, Continued use, or Continued Problem Potential:  Dimension 5:  Relapse, continued use, or continued problem potential critiera description: Unknown, however patient has been using substances for 20+ years, it is likely he may experience relapse/continued use after completing treatment  Dimension 6:  Recovery/Living Environment:  Dimension 6:  Recovery/Iiving environment criteria description: Patient reports living with a supportive mother, however he has lived with her for the last 6-8 years and was able to indulge in substance use behaviors at will.  ASAM Severity Score: ASAM's Severity Rating Score: 11  ASAM Recommended Level of Treatment: ASAM Recommended Level of Treatment: Level III Residential  Treatment   Substance use Disorder (SUD) Substance Use Disorder (SUD)  Checklist Symptoms of Substance Use: Continued use despite persistent or recurrent social, interpersonal problems, caused or exacerbated by use, Evidence of tolerance, Presence of craving or strong urge to use  Recommendations for Services/Supports/Treatments: Recommendations for Services/Supports/Treatments Recommendations For Services/Supports/Treatments: Medication Management, Facility Based Crisis, Detox, SAIOP (Substance Abuse Intensive Outpatient Program), Other (Comment) (Residential substance abuse treatment)  Discharge Disposition:    DSM5 Diagnoses: Patient Active Problem List   Diagnosis Date Noted   Substance induced mood disorder ([place]) 02/25/2021   Perirectal abscess 08/13/2020   Hyponatremia 08/13/2020   Hypokalemia 08/13/2020   Intermittent explosive disorder 12/11/2019   Psychosis, 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

## 2021-02-26 NOTE — Progress Notes (Signed)
Patient sitting in dayroom watching television.  Conversant.  Denies any complaints.  VS rechecked and MD notified.

## 2021-02-26 NOTE — ED Provider Notes (Signed)
Behavioral Health Progress Note  Date and Time: 02/26/2021 12:27 PM Name: Mark Thornton MRN:  [id number]  Subjective:   51 y.o., male patient with h/o AUD who presented to the [facility] on 1/9 for assistance with alcohol detox and treatment, he was admitted to the [facility] for further treatment. 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 Etoh negative; UDS+methamphetamine, amphetamine, cocaine.  Patient seen and chart reviewed.  Most recent CIWA was 0.  Patient has been medication compliant with Ativan taper and has been appropriate with staff and peers on the unit.  Patient interviewed in his room this morning.  He denies SI/HI/AVH.  He affirms that he presented to the [facility] to get assistance with his substance use.  He reports sleeping "okay" and denies issues with appetite.  He denies alcohol withdrawal symptoms of nausea, vomiting, headache, tremors, abdominal pain, diarrhea.  He describes his mood as 7/10 (10 being best).  He states that he is interested in residential rehab after completion of taper.  Discussed with patient that his taper is scheduled to end on Friday.  Patient verbalized understanding.  Patient was given the opportunity to ask questions and  All questions answered. Patient verbalized understanding regarding plan of care.   Past Psychiatric History: Previous Medication Trials: denies Previous Psychiatric Hospitalizations: no Previous Suicide Attempts: no History of Violence: no Outpatient psychiatrist: no  Social History: Marital Status: not married Children: 0 Source of Income: works part time at [employer]:  11th grade Special Ed: no Housing Status: with mother History of phys/sexual abuse: no Easy access to gun: no  Substance Use (with emphasis over the last 12 months) Recreational Drugs: crack cocaine and etoh Use of Alcohol: heavy Tobacco Use: no Rehab History: yes H/O  Complicated Withdrawal: no  Legal History: Past Charges/Incarcerations: denied Pending charges: denied  Family Psychiatric History: unknown  Diagnosis:  Final diagnoses:  Substance induced mood disorder (HCC)  Alcohol abuse    Total Time spent with patient: 15 minutes  Past Psychiatric History: alcohol use Past Medical History:  Past Medical History:  Diagnosis Date   Alcohol abuse     Past Surgical History:  Procedure Laterality Date   IRRIGATION AND DEBRIDEMENT ABSCESS N/A 08/14/2020   Procedure: IEXAM UNDER ANES. RRIGATION AND DEBRIDEMENT PERIRECTAL ABSCESS;  Surgeon: Luretha Murphy, MD;  Location: [facility];  Service: General;  Laterality: N/A;   Family History: No family history on file. Family Psychiatric  History: unknown Social History:  Social History   Substance and Sexual Activity  Alcohol Use Yes   Comment: daily     Social History   Substance and Sexual Activity  Drug Use Yes   Types: IV, Cocaine    Social History   Socioeconomic History   Marital status: Single    Spouse name: Not on file   Number of children: Not on file   Years of education: Not on file   Highest education level: Not on file  Occupational History   Not on file  Tobacco Use   Smoking status: Every Day    Types: Cigarettes   Smokeless tobacco: Never  Substance and Sexual Activity   Alcohol use: Yes    Comment: daily   Drug use: Yes    Types: IV, Cocaine   Sexual activity: Not on file  Other Topics Concern   Not on file  Social History Narrative   Not on file  Social Determinants of Health   Financial Resource Strain: Not on file  Food Insecurity: Not on file  Transportation Needs: Not on file  Physical Activity: Not on file  Stress: Not on file  Social Connections: Not on file   SDOH:  SDOH Screenings   Alcohol Screen: Not on file  Depression (PHQ2-9): Not on file  Financial Resource Strain: Not on file  Food Insecurity: Not on file  Housing: Not on file   Physical Activity: Not on file  Social Connections: Not on file  Stress: Not on file  Tobacco Use: High Risk   Smoking Tobacco Use: Every Day   Smokeless Tobacco Use: Never   Passive Exposure: Not on file  Transportation Needs: Not on file   Additional Social History:    Pain Medications: Patient denies Prescriptions: Patient denies Over the Counter: Patient denies History of alcohol / drug use?: Yes Longest period of sobriety (when/how long): Unknown Negative Consequences of Use:  (None reported) Withdrawal Symptoms: None Name of Substance 1: ETOH (Alcohol) 1 - Age of First Use: 51yo 1 - Amount (size/oz): 12 pack of beers 1 - Frequency: Daily 1 - Duration: 20+ years 1 - Last Use / Amount: 02/23/2021 - (12 pack of beers) 1 - Method of Aquiring: Purchasing from stores 1- Route of Use: Drinking/Oral Name of Substance 2: Crack cocaine 2 - Age of First Use: 51yo 2 - Amount (size/oz): $100 worth - patient refers to this amount as a "16" 2 - Frequency: Weekly (reports using only on the weekends) 2 - Duration: 20 + years 2 - Last Use / Amount: 02/23/2021 - ($100 worth 2 - Method of Aquiring: Purchasing from dealers 2 - Route of Substance Use: Smoking                Sleep: Fair  Appetite:  Fair  Current Medications:  Current Facility-Administered Medications  Medication Dose Route Frequency Provider Last Rate Last Admin   acetaminophen (TYLENOL) tablet 650 mg  650 mg Oral Q6H PRN Ardis Hughs, NP       alum & mag hydroxide-simeth (MAALOX/MYLANTA) 200-200-20 MG/5ML suspension 30 mL  30 mL Oral Q4H PRN Ardis Hughs, NP       hydrOXYzine (ATARAX) tablet 25 mg  25 mg Oral Q6H PRN Ardis Hughs, NP       loperamide (IMODIUM) capsule 2-4 mg  2-4 mg Oral PRN Ardis Hughs, NP       LORazepam (ATIVAN) tablet 1 mg  1 mg Oral Q6H PRN Ardis Hughs, NP       LORazepam (ATIVAN) tablet 1 mg  1 mg Oral QID Ardis Hughs, NP   1 mg at 02/26/21 1009    Followed by   LORazepam (ATIVAN) tablet 1 mg  1 mg Oral TID Ardis Hughs, NP       Followed by   Melene Muller ON 02/27/2021] LORazepam (ATIVAN) tablet 1 mg  1 mg Oral BID Ardis Hughs, NP       Followed by   Melene Muller ON 03/01/2021] LORazepam (ATIVAN) tablet 1 mg  1 mg Oral Daily Ardis Hughs, NP       magnesium hydroxide (MILK OF MAGNESIA) suspension 30 mL  30 mL Oral Daily PRN Ardis Hughs, NP       multivitamin with minerals tablet 1 tablet  1 tablet Oral Daily Ardis Hughs, NP   1 tablet at 02/26/21 1009   nicotine (NICODERM CQ - dosed in  mg/24 hours) patch 21 mg  21 mg Transdermal Daily Vernard Gambles H, NP   21 mg at 02/26/21 1009   ondansetron (ZOFRAN-ODT) disintegrating tablet 4 mg  4 mg Oral Q6H PRN Ardis Hughs, NP       thiamine tablet 100 mg  100 mg Oral Daily Ardis Hughs, NP   100 mg at 02/26/21 1009   traZODone (DESYREL) tablet 50 mg  50 mg Oral QHS PRN Ardis Hughs, NP       Current Outpatient Medications  Medication Sig Dispense Refill   amLODipine (NORVASC) 5 MG tablet Take 1 tablet (5 mg total) by mouth daily. 30 tablet 0   aspirin EC 81 MG tablet Take 81-162 mg by mouth as needed for mild pain (or headaches). Swallow whole.     cyclobenzaprine (FLEXERIL) 10 MG tablet Take 1 tablet (10 mg total) by mouth 2 (two) times daily as needed for muscle spasms. 20 tablet 0   naproxen (NAPROSYN) 375 MG tablet Take 1 tablet (375 mg total) by mouth 2 (two) times daily. 20 tablet 0    Labs  Lab Results:  Admission on 02/25/2021  Component Date Value Ref Range Status   SARS Coronavirus 2 by RT PCR 02/25/2021 NEGATIVE  NEGATIVE Final   Comment: (NOTE) SARS-CoV-2 target nucleic acids are NOT DETECTED.  The SARS-CoV-2 RNA is generally detectable in upper respiratory specimens during the acute phase of infection. The lowest concentration of SARS-CoV-2 viral copies this assay can detect is 138 copies/mL. A negative result does not preclude  SARS-Cov-2 infection and should not be used as the sole basis for treatment or other patient management decisions. A negative result may occur with  improper specimen collection/handling, submission of specimen other than nasopharyngeal swab, presence of viral mutation(s) within the areas targeted by this assay, and inadequate number of viral copies(<138 copies/mL). A negative result must be combined with clinical observations, patient history, and epidemiological information. The expected result is Negative.  Fact Sheet for Patients:  BloggerCourse.com  Fact Sheet for Healthcare Providers:  SeriousBroker.it  This test is no                          t yet approved or cleared by the [place] FDA and  has been authorized for detection and/or diagnosis of SARS-CoV-2 by FDA under an Emergency Use Authorization (EUA). This EUA will remain  in effect (meaning this test can be used) for the duration of the COVID-19 declaration under Section 564(b)(1) of the Act, 21 U.S.C.section 360bbb-3(b)(1), unless the authorization is terminated  or revoked sooner.       Influenza A by PCR 02/25/2021 NEGATIVE  NEGATIVE Final   Influenza B by PCR 02/25/2021 NEGATIVE  NEGATIVE Final   Comment: (NOTE) The Xpert Xpress SARS-CoV-2/FLU/RSV plus assay is intended as an aid in the diagnosis of influenza from Nasopharyngeal swab specimens and should not be used as a sole basis for treatment. Nasal washings and aspirates are unacceptable for Xpert Xpress SARS-CoV-2/FLU/RSV testing.  Fact Sheet for Patients: BloggerCourse.com  Fact Sheet for Healthcare Providers: SeriousBroker.it  This test is not yet approved or cleared by the [place] FDA and has been authorized for detection and/or diagnosis of SARS-CoV-2 by FDA under an Emergency Use Authorization (EUA). This EUA will remain in effect  (meaning this test can be used) for the duration of the COVID-19 declaration under Section 564(b)(1) of the Act, 21 U.S.C. section 360bbb-3(b)(1), unless the  authorization is terminated or revoked.  Performed at [facility], [address] [place], [place] [address]    Hgb A1c MFr Bld 02/25/2021 4.9  4.8 - 5.6 % Final   Comment: (NOTE) Pre diabetes:          5.7%-6.4%  Diabetes:              >6.4%  Glycemic control for   <7.0% adults with diabetes    Mean Plasma Glucose 02/25/2021 93.93  mg/dL Final   Performed at [facility], [address] [place], [place] [address]   Cholesterol 02/25/2021 200  0 - 200 mg/dL Final   Triglycerides 09/81/191401/10/2021 48  <150 mg/dL Final   HDL 78/29/562101/10/2021 70  >40 mg/dL Final   Total CHOL/HDL Ratio 02/25/2021 2.9  RATIO Final   VLDL 02/25/2021 10  0 - 40 mg/dL Final   LDL Cholesterol 02/25/2021 120 (H)  0 - 99 mg/dL Final   Comment:        Total Cholesterol/HDL:CHD Risk Coronary Heart Disease Risk Table                     Men   Women  1/2 Average Risk   3.4   3.3  Average Risk       5.0   4.4  2 X Average Risk   9.6   7.1  3 X Average Risk  23.4   11.0        Use the calculated Patient Ratio above and the CHD Risk Table to determine the patient's CHD Risk.        ATP III CLASSIFICATION (LDL):  <100     mg/dL   Optimal  [id number]  mg/dL   Near or Above                    Optimal  130-159  mg/dL   Borderline  [id number]  mg/dL   High  >952>190     mg/dL   Very High Performed at [facility], [address] [place], [place] [address]    Alcohol, Ethyl (B) 02/25/2021 <10  <10 mg/dL Final   Comment: (NOTE) Lowest detectable limit for serum alcohol is 10 mg/dL.  For medical purposes only. Performed at [facility], [address] [place], [place] [address]    TSH 02/25/2021 0.839  0.350 - 4.500 uIU/mL Final   Comment: Performed by a 3rd Generation assay with a functional sensitivity of <=0.01 uIU/mL. Performed at [facility], [address] [place], [place] [address]    POC Amphetamine UR 02/25/2021 Positive (A)  NONE DETECTED (Cut Off Level 1000 ng/mL) Preliminary   POC Secobarbital (BAR) 02/25/2021 None Detected  NONE DETECTED (Cut Off Level 300 ng/mL) Preliminary   POC Buprenorphine (BUP) 02/25/2021 None Detected  NONE DETECTED (Cut Off Level 10 ng/mL) Preliminary   POC Oxazepam (BZO) 02/25/2021 None Detected  NONE DETECTED (Cut Off Level 300 ng/mL) Preliminary   POC Cocaine UR 02/25/2021 Positive (A)  NONE DETECTED (Cut Off Level 300 ng/mL) Preliminary   POC Methamphetamine UR 02/25/2021 Positive (A)  NONE DETECTED (Cut Off Level 1000 ng/mL) Preliminary   POC Morphine 02/25/2021 None Detected  NONE DETECTED (Cut Off Level 300 ng/mL) Preliminary   POC Oxycodone UR 02/25/2021 None Detected  NONE DETECTED (Cut Off Level 100 ng/mL) Preliminary   POC Methadone UR 02/25/2021 None Detected  NONE DETECTED (Cut Off Level 300 ng/mL)  Preliminary   POC Marijuana UR 02/25/2021 None Detected  NONE DETECTED (Cut Off Level 50 ng/mL) Preliminary   SARSCOV2ONAVIRUS 2 AG 02/25/2021 NEGATIVE  NEGATIVE Final   Comment: (NOTE) SARS-CoV-2 antigen NOT DETECTED.   Negative results are presumptive.  Negative results do not preclude SARS-CoV-2 infection and should not be used as the sole basis for treatment or other patient management decisions, including infection  control decisions, particularly in the presence of clinical signs and  symptoms consistent with COVID-19, or in those who have been in contact with the virus.  Negative results must be combined with clinical observations, patient history, and epidemiological information. The expected result is Negative.  Fact Sheet for Patients: https://www.jennings-kim.com/  Fact Sheet for Healthcare Providers: https://alexander-rogers.biz/  This test is not yet approved or cleared by the [place] FDA and  has been authorized for detection  and/or diagnosis of SARS-CoV-2 by FDA under an Emergency Use Authorization (EUA).  This EUA will remain in effect (meaning this test can be used) for the duration of  the COV                          ID-19 declaration under Section 564(b)(1) of the Act, 21 U.S.C. section 360bbb-3(b)(1), unless the authorization is terminated or revoked sooner.    Admission on 02/24/2021, Discharged on 02/25/2021  Component Date Value Ref Range Status   Sodium 02/24/2021 136  135 - 145 mmol/L Final   Potassium 02/24/2021 4.5  3.5 - 5.1 mmol/L Final   DELTA CHECK NOTED   Chloride 02/24/2021 97 (L)  98 - 111 mmol/L Final   CO2 02/24/2021 28  22 - 32 mmol/L Final   Glucose, Bld 02/24/2021 98  70 - 99 mg/dL Final   Glucose reference range applies only to samples taken after fasting for at least 8 hours.   BUN 02/24/2021 20  6 - 20 mg/dL Final   Creatinine, Ser 02/24/2021 0.86  0.61 - 1.24 mg/dL Final   Calcium 16/11/9602 9.7  8.9 - 10.3 mg/dL Final   Total Protein 54/10/8117 9.4 (H)  6.5 - 8.1 g/dL Final   Albumin 14/78/2956 4.8  3.5 - 5.0 g/dL Final   AST 21/30/8657 31  15 - 41 U/L Final   ALT 02/24/2021 21  0 - 44 U/L Final   Alkaline Phosphatase 02/24/2021 60  38 - 126 U/L Final   Total Bilirubin 02/24/2021 1.7 (H)  0.3 - 1.2 mg/dL Final   GFR, Estimated 02/24/2021 >60  >60 mL/min Final   Comment: (NOTE) Calculated using the CKD-EPI Creatinine Equation (2021)    Anion gap 02/24/2021 11  5 - 15 Final   Performed at [facility], [address] [place], [place] [address]   Lipase 02/24/2021 24  11 - 51 U/L Final   Performed at [facility], [address] [place], [place] [address]   WBC 02/24/2021 4.7  4.0 - 10.5 K/uL Final   RBC 02/24/2021 4.89  4.22 - 5.81 MIL/uL Final   Hemoglobin 02/24/2021 14.5  13.0 - 17.0 g/dL Final   HCT 41/32/4401 41.7  39.0 - 52.0 % Final   MCV 02/24/2021 85.3  80.0 - 100.0 fL Final   MCH 02/24/2021 29.7  26.0 - 34.0 pg Final    MCHC 02/24/2021 34.8  30.0 - 36.0 g/dL Final   RDW 02/72/5366 11.9  11.5 - 15.5 % Final   Platelets 02/24/2021 331  150 - 400 K/uL Final  nRBC 02/24/2021 0.0  0.0 - 0.2 % Final   Neutrophils Relative % 02/24/2021 62  % Final   Neutro Abs 02/24/2021 2.9  1.7 - 7.7 K/uL Final   Lymphocytes Relative 02/24/2021 24  % Final   Lymphs Abs 02/24/2021 1.2  0.7 - 4.0 K/uL Final   Monocytes Relative 02/24/2021 12  % Final   Monocytes Absolute 02/24/2021 0.6  0.1 - 1.0 K/uL Final   Eosinophils Relative 02/24/2021 2  % Final   Eosinophils Absolute 02/24/2021 0.1  0.0 - 0.5 K/uL Final   Basophils Relative 02/24/2021 0  % Final   Basophils Absolute 02/24/2021 0.0  0.0 - 0.1 K/uL Final   Immature Granulocytes 02/24/2021 0  % Final   Abs Immature Granulocytes 02/24/2021 0.01  0.00 - 0.07 K/uL Final   Performed at [facility], [address] [place], [place] [address]   Color, Urine 02/25/2021 AMBER (A)  YELLOW Final   BIOCHEMICALS MAY BE AFFECTED BY COLOR   APPearance 02/25/2021 CLEAR  CLEAR Final   Specific Gravity, Urine 02/25/2021 1.032 (H)  1.005 - 1.030 Final   pH 02/25/2021 5.0  5.0 - 8.0 Final   Glucose, UA 02/25/2021 NEGATIVE  NEGATIVE mg/dL Final   Hgb urine dipstick 02/25/2021 NEGATIVE  NEGATIVE Final   Bilirubin Urine 02/25/2021 NEGATIVE  NEGATIVE Final   Ketones, ur 02/25/2021 80 (A)  NEGATIVE mg/dL Final   Protein, ur 40/34/7425 100 (A)  NEGATIVE mg/dL Final   Nitrite 95/63/8756 NEGATIVE  NEGATIVE Final   Leukocytes,Ua 02/25/2021 NEGATIVE  NEGATIVE Final   RBC / HPF 02/25/2021 0-5  0 - 5 RBC/hpf Final   WBC, UA 02/25/2021 0-5  0 - 5 WBC/hpf Final   Bacteria, UA 02/25/2021 RARE (A)  NONE SEEN Final   Squamous Epithelial / LPF 02/25/2021 0-5  0 - 5 Final   Mucus 02/25/2021 PRESENT   Final   Hyaline Casts, UA 02/25/2021 PRESENT   Final   Granular Casts, UA 02/25/2021 PRESENT   Final   Performed at [facility], [address] [place], [place]  [address]   SARS Coronavirus 2 by RT PCR 02/25/2021 NEGATIVE  NEGATIVE Final   Comment: (NOTE) SARS-CoV-2 target nucleic acids are NOT DETECTED.  The SARS-CoV-2 RNA is generally detectable in upper respiratory specimens during the acute phase of infection. The lowest concentration of SARS-CoV-2 viral copies this assay can detect is 138 copies/mL. A negative result does not preclude SARS-Cov-2 infection and should not be used as the sole basis for treatment or other patient management decisions. A negative result may occur with  improper specimen collection/handling, submission of specimen other than nasopharyngeal swab, presence of viral mutation(s) within the areas targeted by this assay, and inadequate number of viral copies(<138 copies/mL). A negative result must be combined with clinical observations, patient history, and epidemiological information. The expected result is Negative.  Fact Sheet for Patients:  BloggerCourse.com  Fact Sheet for Healthcare Providers:  SeriousBroker.it  This test is no                          t yet approved or cleared by the [place] FDA and  has been authorized for detection and/or diagnosis of SARS-CoV-2 by FDA under an Emergency Use Authorization (EUA). This EUA will remain  in effect (meaning this test can be used) for the duration of the COVID-19 declaration under Section 564(b)(1) of the Act, 21 U.S.C.section 360bbb-3(b)(1), unless the authorization  is terminated  or revoked sooner.       Influenza A by PCR 02/25/2021 NEGATIVE  NEGATIVE Final   Influenza B by PCR 02/25/2021 NEGATIVE  NEGATIVE Final   Comment: (NOTE) The Xpert Xpress SARS-CoV-2/FLU/RSV plus assay is intended as an aid in the diagnosis of influenza from Nasopharyngeal swab specimens and should not be used as a sole basis for treatment. Nasal washings and aspirates are unacceptable for Xpert Xpress  SARS-CoV-2/FLU/RSV testing.  Fact Sheet for Patients: BloggerCourse.com  Fact Sheet for Healthcare Providers: SeriousBroker.it  This test is not yet approved or cleared by the [place] FDA and has been authorized for detection and/or diagnosis of SARS-CoV-2 by FDA under an Emergency Use Authorization (EUA). This EUA will remain in effect (meaning this test can be used) for the duration of the COVID-19 declaration under Section 564(b)(1) of the Act, 21 U.S.C. section 360bbb-3(b)(1), unless the authorization is terminated or revoked.  Performed at [facility], [address] [place], [place] [address]   Admission on 02/24/2021, Discharged on 02/24/2021  Component Date Value Ref Range Status   Glucose-Capillary 02/24/2021 110 (H)  70 - 99 mg/dL Final   Glucose reference range applies only to samples taken after fasting for at least 8 hours.   WBC 02/24/2021 6.3  4.0 - 10.5 K/uL Final   RBC 02/24/2021 4.58  4.22 - 5.81 MIL/uL Final   Hemoglobin 02/24/2021 13.6  13.0 - 17.0 g/dL Final   HCT 47/82/9562 39.4  39.0 - 52.0 % Final   MCV 02/24/2021 86.0  80.0 - 100.0 fL Final   MCH 02/24/2021 29.7  26.0 - 34.0 pg Final   MCHC 02/24/2021 34.5  30.0 - 36.0 g/dL Final   RDW 13/09/6576 11.9  11.5 - 15.5 % Final   Platelets 02/24/2021 313  150 - 400 K/uL Final   nRBC 02/24/2021 0.0  0.0 - 0.2 % Final   Neutrophils Relative % 02/24/2021 62  % Final   Neutro Abs 02/24/2021 3.9  1.7 - 7.7 K/uL Final   Lymphocytes Relative 02/24/2021 25  % Final   Lymphs Abs 02/24/2021 1.6  0.7 - 4.0 K/uL Final   Monocytes Relative 02/24/2021 10  % Final   Monocytes Absolute 02/24/2021 0.7  0.1 - 1.0 K/uL Final   Eosinophils Relative 02/24/2021 1  % Final   Eosinophils Absolute 02/24/2021 0.1  0.0 - 0.5 K/uL Final   Basophils Relative 02/24/2021 1  % Final   Basophils Absolute 02/24/2021 0.0  0.0 - 0.1 K/uL Final   Immature  Granulocytes 02/24/2021 1  % Final   Abs Immature Granulocytes 02/24/2021 0.04  0.00 - 0.07 K/uL Final   Performed at [facility], [address], [place], [place] [address]   Alcohol, Ethyl (B) 02/24/2021 <10  <10 mg/dL Final   Comment: (NOTE) Lowest detectable limit for serum alcohol is 10 mg/dL.  For medical purposes only. Performed at [facility], [address] [place], [place] [address]    Sodium 02/24/2021 137  135 - 145 mmol/L Final   Potassium 02/24/2021 3.4 (L)  3.5 - 5.1 mmol/L Final   Chloride 02/24/2021 99  98 - 111 mmol/L Final   CO2 02/24/2021 27  22 - 32 mmol/L Final   Glucose, Bld 02/24/2021 125 (H)  70 - 99 mg/dL Final   Glucose reference range applies only to samples taken after fasting for at least 8 hours.   BUN 02/24/2021 20  6 - 20 mg/dL  Final   Creatinine, Ser 02/24/2021 0.83  0.61 - 1.24 mg/dL Final   Calcium 18/56/3149 9.4  8.9 - 10.3 mg/dL Final   Total Protein 70/26/3785 9.2 (H)  6.5 - 8.1 g/dL Final   Albumin 88/50/2774 4.8  3.5 - 5.0 g/dL Final   AST 12/87/8676 31  15 - 41 U/L Final   ALT 02/24/2021 21  0 - 44 U/L Final   Alkaline Phosphatase 02/24/2021 61  38 - 126 U/L Final   Total Bilirubin 02/24/2021 1.5 (H)  0.3 - 1.2 mg/dL Final   GFR, Estimated 02/24/2021 >60  >60 mL/min Final   Comment: (NOTE) Calculated using the CKD-EPI Creatinine Equation (2021)    Anion gap 02/24/2021 11  5 - 15 Final   Performed at [facility], [address], [place], [place] [address]   Troponin I (High Sensitivity) 02/24/2021 4  <18 ng/L Final   Comment: (NOTE) Elevated high sensitivity troponin I (hsTnI) values and significant  changes across serial measurements may suggest ACS but many other  chronic and acute conditions are known to elevate hsTnI results.  Refer to the "Links" section for chest pain algorithms and additional  guidance. Performed at [facility], [address] [place], [place] [address]   Admission on 12/18/2020, Discharged on 12/18/2020  Component Date Value Ref Range Status   WBC 12/18/2020 6.1  4.0 - 10.5 K/uL Final   RBC 12/18/2020 5.16  4.22 - 5.81 MIL/uL Final   Hemoglobin 12/18/2020 15.0  13.0 - 17.0 g/dL Final   HCT 83/66/2947 44.2  39.0 - 52.0 % Final   MCV 12/18/2020 85.7  80.0 - 100.0 fL Final   MCH 12/18/2020 29.1  26.0 - 34.0 pg Final   MCHC 12/18/2020 33.9  30.0 - 36.0 g/dL Final   RDW 65/46/5035 12.7  11.5 - 15.5 % Final   Platelets 12/18/2020 291  150 - 400 K/uL Final   nRBC 12/18/2020 0.0  0.0 - 0.2 % Final   Performed at [facility], [address], [place], [place] [address]   Sodium 12/18/2020 134 (L)  135 - 145 mmol/L Final   Potassium 12/18/2020 3.4 (L)  3.5 - 5.1 mmol/L Final   Chloride 12/18/2020 94 (L)  98 - 111 mmol/L Final   CO2 12/18/2020 30  22 - 32 mmol/L Final   Glucose, Bld 12/18/2020 158 (H)  70 - 99 mg/dL Final   Glucose reference range applies only to samples taken after fasting for at least 8 hours.   BUN 12/18/2020 11  6 - 20 mg/dL Final   Creatinine, Ser 12/18/2020 0.76  0.61 - 1.24 mg/dL Final   Calcium 12/75/1700 9.6  8.9 - 10.3 mg/dL Final   Total Protein 17/49/4496 8.4 (H)  6.5 - 8.1 g/dL Final   Albumin 75/91/6384 4.0  3.5 - 5.0 g/dL Final   AST 66/59/9357 209 (H)  15 - 41 U/L Final   ALT 12/18/2020 104 (H)  0 - 44 U/L Final   Alkaline Phosphatase 12/18/2020 61  38 - 126 U/L Final   Total Bilirubin 12/18/2020 1.0  0.3 - 1.2 mg/dL Final   GFR, Estimated 12/18/2020 >60  >60 mL/min Final   Comment: (NOTE) Calculated using the CKD-EPI Creatinine Equation (2021)    Anion gap 12/18/2020 10  5 - 15 Final   Performed at [facility], [address] [place], [place] [address]   Lipase 12/18/2020 38  11 - 51 U/L Final  Performed at [facility], [address] [place], [place] [address]   SARS Coronavirus 2 by RT PCR 12/18/2020 NEGATIVE   NEGATIVE Final   Comment: (NOTE) SARS-CoV-2 target nucleic acids are NOT DETECTED.  The SARS-CoV-2 RNA is generally detectable in upper respiratory specimens during the acute phase of infection. The lowest concentration of SARS-CoV-2 viral copies this assay can detect is 138 copies/mL. A negative result does not preclude SARS-Cov-2 infection and should not be used as the sole basis for treatment or other patient management decisions. A negative result may occur with  improper specimen collection/handling, submission of specimen other than nasopharyngeal swab, presence of viral mutation(s) within the areas targeted by this assay, and inadequate number of viral copies(<138 copies/mL). A negative result must be combined with clinical observations, patient history, and epidemiological information. The expected result is Negative.  Fact Sheet for Patients:  BloggerCourse.com  Fact Sheet for Healthcare Providers:  SeriousBroker.it  This test is no                          t yet approved or cleared by the [place] FDA and  has been authorized for detection and/or diagnosis of SARS-CoV-2 by FDA under an Emergency Use Authorization (EUA). This EUA will remain  in effect (meaning this test can be used) for the duration of the COVID-19 declaration under Section 564(b)(1) of the Act, 21 U.S.C.section 360bbb-3(b)(1), unless the authorization is terminated  or revoked sooner.       Influenza A by PCR 12/18/2020 NEGATIVE  NEGATIVE Final   Influenza B by PCR 12/18/2020 NEGATIVE  NEGATIVE Final   Comment: (NOTE) The Xpert Xpress SARS-CoV-2/FLU/RSV plus assay is intended as an aid in the diagnosis of influenza from Nasopharyngeal swab specimens and should not be used as a sole basis for treatment. Nasal washings and aspirates are unacceptable for Xpert Xpress SARS-CoV-2/FLU/RSV testing.  Fact Sheet for  Patients: BloggerCourse.com  Fact Sheet for Healthcare Providers: SeriousBroker.it  This test is not yet approved or cleared by the [place] FDA and has been authorized for detection and/or diagnosis of SARS-CoV-2 by FDA under an Emergency Use Authorization (EUA). This EUA will remain in effect (meaning this test can be used) for the duration of the COVID-19 declaration under Section 564(b)(1) of the Act, 21 U.S.C. section 360bbb-3(b)(1), unless the authorization is terminated or revoked.  Performed at [facility], [address], [place], [place] [address]    Color, Urine 12/18/2020 YELLOW  YELLOW Final   APPearance 12/18/2020 CLEAR  CLEAR Final   Specific Gravity, Urine 12/18/2020 1.018  1.005 - 1.030 Final   pH 12/18/2020 5.0  5.0 - 8.0 Final   Glucose, UA 12/18/2020 NEGATIVE  NEGATIVE mg/dL Final   Hgb urine dipstick 12/18/2020 SMALL (A)  NEGATIVE Final   Bilirubin Urine 12/18/2020 NEGATIVE  NEGATIVE Final   Ketones, ur 12/18/2020 20 (A)  NEGATIVE mg/dL Final   Protein, ur 29/56/2130 NEGATIVE  NEGATIVE mg/dL Final   Nitrite 86/57/8469 NEGATIVE  NEGATIVE Final   Leukocytes,Ua 12/18/2020 NEGATIVE  NEGATIVE Final   RBC / HPF 12/18/2020 0-5  0 - 5 RBC/hpf Final   WBC, UA 12/18/2020 0-5  0 - 5 WBC/hpf Final   Bacteria, UA 12/18/2020 RARE (A)  NONE SEEN Final   Mucus 12/18/2020 PRESENT   Final   Performed at [facility], [address] [place], [place] [address]   Hepatitis B Surface Ag 12/18/2020  NON REACTIVE  NON REACTIVE Final   HCV Ab 12/18/2020 Reactive (A)  NON REACTIVE Final   Comment: (NOTE) The CDC recommends that a Reactive HCV antibody result be followed up  with a HCV Nucleic Acid Amplification test.     Hep A IgM 12/18/2020 NON REACTIVE  NON REACTIVE Final   Hep B C IgM 12/18/2020 NON REACTIVE  NON REACTIVE Final   Performed at [facility], [address],  [place], [place] [address]  Admission on 11/25/2020, Discharged on 11/25/2020  Component Date Value Ref Range Status   WBC 11/25/2020 4.2  4.0 - 10.5 K/uL Final   RBC 11/25/2020 4.70  4.22 - 5.81 MIL/uL Final   Hemoglobin 11/25/2020 13.9  13.0 - 17.0 g/dL Final   HCT 92/12/9415 40.7  39.0 - 52.0 % Final   MCV 11/25/2020 86.6  80.0 - 100.0 fL Final   MCH 11/25/2020 29.6  26.0 - 34.0 pg Final   MCHC 11/25/2020 34.2  30.0 - 36.0 g/dL Final   RDW 40/81/4481 12.6  11.5 - 15.5 % Final   Platelets 11/25/2020 321  150 - 400 K/uL Final   nRBC 11/25/2020 0.0  0.0 - 0.2 % Final   Neutrophils Relative % 11/25/2020 56  % Final   Neutro Abs 11/25/2020 2.4  1.7 - 7.7 K/uL Final   Lymphocytes Relative 11/25/2020 33  % Final   Lymphs Abs 11/25/2020 1.4  0.7 - 4.0 K/uL Final   Monocytes Relative 11/25/2020 9  % Final   Monocytes Absolute 11/25/2020 0.4  0.1 - 1.0 K/uL Final   Eosinophils Relative 11/25/2020 1  % Final   Eosinophils Absolute 11/25/2020 0.0  0.0 - 0.5 K/uL Final   Basophils Relative 11/25/2020 1  % Final   Basophils Absolute 11/25/2020 0.0  0.0 - 0.1 K/uL Final   Immature Granulocytes 11/25/2020 0  % Final   Abs Immature Granulocytes 11/25/2020 0.01  0.00 - 0.07 K/uL Final   Performed at [facility], [address] [place], [place] [address]   Sodium 11/25/2020 138  135 - 145 mmol/L Final   Potassium 11/25/2020 3.6  3.5 - 5.1 mmol/L Final   Chloride 11/25/2020 105  98 - 111 mmol/L Final   CO2 11/25/2020 24  22 - 32 mmol/L Final   Glucose, Bld 11/25/2020 120 (H)  70 - 99 mg/dL Final   Glucose reference range applies only to samples taken after fasting for at least 8 hours.   BUN 11/25/2020 6  6 - 20 mg/dL Final   Creatinine, Ser 11/25/2020 0.76  0.61 - 1.24 mg/dL Final   Calcium 49/70/2637 8.2 (L)  8.9 - 10.3 mg/dL Final   Total Protein 85/88/5027 8.2 (H)  6.5 - 8.1 g/dL Final   Albumin 74/01/8785 3.9  3.5 - 5.0 g/dL Final   AST 76/72/0947 24  15 - 41 U/L Final   ALT  11/25/2020 15  0 - 44 U/L Final   Alkaline Phosphatase 11/25/2020 63  38 - 126 U/L Final   Total Bilirubin 11/25/2020 0.6  0.3 - 1.2 mg/dL Final   GFR, Estimated 11/25/2020 >60  >60 mL/min Final   Comment: (NOTE) Calculated using the CKD-EPI Creatinine Equation (2021)    Anion gap 11/25/2020 9  5 - 15 Final   Performed at [facility], [address], [place], [place] [address]   Alcohol, Ethyl (B) 11/25/2020 335 (HH)  <10 mg/dL Final   Comment: CRITICAL RESULT CALLED TO, READ BACK  BY AND VERIFIED WITH: SOUTHARD, RN @ [phone] ON 11/25/2020 BY LBROOKS, MLT (NOTE) Lowest detectable limit for serum alcohol is 10 mg/dL.  For medical purposes only. Performed at [facility], [address] [place], [place] [address]    Salicylate Lvl 11/25/2020 <7.0 (L)  7.0 - 30.0 mg/dL Final   Performed at [facility], [address] [place], [place] [address]   Acetaminophen (Tylenol), Serum 11/25/2020 <10 (L)  10 - 30 ug/mL Final   Comment: (NOTE) Therapeutic concentrations vary significantly. A range of 10-30 ug/mL  may be an effective concentration for many patients. However, some  are best treated at concentrations outside of this range. Acetaminophen concentrations >150 ug/mL at 4 hours after ingestion  and >50 ug/mL at 12 hours after ingestion are often associated with  toxic reactions.  Performed at [facility], [address] [place], [place] [address]     Blood Alcohol level:  Lab Results  Component Value Date   ETH <10 02/25/2021   ETH <10 02/24/2021    Metabolic Disorder Labs: Lab Results  Component Value Date   HGBA1C 4.9 02/25/2021   MPG 93.93 02/25/2021   No results found for: PROLACTIN Lab Results  Component Value Date   CHOL 200 02/25/2021   TRIG 48 02/25/2021   HDL 70 02/25/2021   CHOLHDL 2.9 02/25/2021   VLDL 10 02/25/2021   LDLCALC 120 (H) 02/25/2021    Therapeutic Lab Levels: No  results found for: LITHIUM No results found for: VALPROATE No components found for:  CBMZ  Physical Findings   Flowsheet Row [facility] from 12/18/2020 in [facility]  C-SSRS RISK CATEGORY No Risk No Risk No Risk        Musculoskeletal  Strength & Muscle Tone: within normal limits Gait & Station: normal Patient leans: N/A  Psychiatric Specialty Exam  Presentation  General Appearance: Casual  Eye Contact:Fair  Speech:Clear and Coherent; Normal Rate  Speech Volume:Normal  Handedness:Right   Mood and Affect  Mood:Euthymic  Affect:Appropriate; Congruent   Thought Process  Thought Processes:Coherent; Goal Directed; Linear  Descriptions of Associations:Intact  Orientation:Full (Time, Place and Person)  Thought Content:WDL; Logical  Diagnosis of Schizophrenia or Schizoaffective disorder in past: No    Hallucinations:Hallucinations: None  Ideas of Reference:None  Suicidal Thoughts:Suicidal Thoughts: No  Homicidal Thoughts:Homicidal Thoughts: No   Sensorium  Memory:Immediate Good; Recent Good; Remote Fair  Judgment:Fair  Insight:Fair (fair insight into substance use; help seeking)   Executive Functions  Concentration:Good  Attention Span:Good  Recall:Good  Fund of Knowledge:Good  Language:Good   Psychomotor Activity  Psychomotor Activity:Psychomotor Activity: Normal   Assets  Assets:Communication Skills; Desire for Improvement; Financial Resources/Insurance; Housing; Resilience; Social Support; Vocational/Educational   Sleep  Sleep:Sleep: Fair Number of Hours of Sleep: 0   Nutritional Assessment (For OBS and FBC admissions only) Has the patient had a weight loss or gain of 10 pounds or more in the last 3 months?: No Has the patient had a decrease in food intake/or appetite?: Yes Does  the patient have dental problems?: No Has the patient recently lost weight without trying?: 3 Has the patient been eating poorly because of a decreased appetite?: 1 Malnutrition Screening Tool Score: 4    Physical Exam  Physical Exam Constitutional:      Appearance: Normal appearance. He is normal weight.  HENT:  Head: Normocephalic and atraumatic.  Eyes:     Extraocular Movements: Extraocular movements intact.  Pulmonary:     Effort: Pulmonary effort is normal.  Neurological:     General: No focal deficit present.     Mental Status: He is alert and oriented to person, place, and time.  Psychiatric:        Attention and Perception: Attention and perception normal.        Speech: Speech normal.        Behavior: Behavior normal. Behavior is cooperative.        Thought Content: Thought content normal.   Review of Systems  Constitutional:  Negative for chills and fever.  HENT:  Negative for hearing loss.   Eyes:  Negative for discharge and redness.  Respiratory:  Negative for cough.   Cardiovascular:  Negative for chest pain.  Gastrointestinal:  Negative for abdominal pain, nausea and vomiting.  Musculoskeletal:  Negative for myalgias.  Neurological:  Negative for tremors and headaches.  Psychiatric/Behavioral:  Positive for substance abuse. Negative for depression, hallucinations and suicidal ideas.   Blood pressure 97/60, pulse 86, temperature 97.6 F (36.4 C), temperature source Oral, resp. rate 16, SpO2 100 %. There is no height or weight on file to calculate BMI.  Treatment Plan Summary: 51 y.o., male patient with h/o AUD who presented to the [facility] on 1/9 for assistance with alcohol detox and treatment, he was admitted to the [facility] for further treatment. 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 Etoh negative; UDS+methamphetamine, amphetamine, cocaine.   Patient denies SI/HI/AVH.  Tolerating Ativan  taper well.  Most recent CIWA was 0.  Patient denying symptoms of alcohol withdrawal.  Patient remains appropriate for continued treatment on the [facility] to assist with alcohol detox.  Anticipate discharged on Friday to residential rehab once Ativan taper is completed.   AUD, severe -continue CIWA protocol -continue ativan detox- 1 mg QID --> 1 mg TID--> 1 mg BID--> 1 mg daily -multivitamin -thiamine  Dispo: ongoing. SW assisting. Anticipate discharge on Friday to residential rehab after completion of tap   Estella Husk, MD 02/26/2021 12:27 PM

## 2021-02-26 NOTE — ED Notes (Signed)
Pt sleeping@this time. Breathing even and unlabored. Will continue to monitor for safety 

## 2021-02-26 NOTE — ED Notes (Signed)
Patient denies SI,HI,AVH.  Patient is cooperative and interacts well with staff. Respiratory is even and unlabored. No distress noted. Patient resting in bed at present. Patient stated no complaints at present. will continue to monitor for safety.  

## 2021-02-26 NOTE — Progress Notes (Signed)
Hypotensive, CIWA 0.  Patient sleeping throughout most of shift.  Rouses easily. Denies any complaint.  Up to dining room for lunch.  Dr. Bronwen Betters notified of VS/CIWA  via secure chat.  Will recheck BP prior to next medication administration.

## 2021-02-27 DIAGNOSIS — F1010 Alcohol abuse, uncomplicated: Secondary | ICD-10-CM | POA: Diagnosis not present

## 2021-02-27 DIAGNOSIS — F17210 Nicotine dependence, cigarettes, uncomplicated: Secondary | ICD-10-CM | POA: Diagnosis not present

## 2021-02-27 DIAGNOSIS — Z20822 Contact with and (suspected) exposure to covid-19: Secondary | ICD-10-CM | POA: Diagnosis not present

## 2021-02-27 DIAGNOSIS — F1994 Other psychoactive substance use, unspecified with psychoactive substance-induced mood disorder: Secondary | ICD-10-CM | POA: Diagnosis not present

## 2021-02-27 LAB — T.PALLIDUM AB, TOTAL: T Pallidum Abs: NONREACTIVE

## 2021-02-27 NOTE — ED Provider Notes (Signed)
Behavioral Health Progress Note  Date and Time: 02/27/2021 4:31 PM Name: Mark Thornton MRN:  [id number]  Subjective:   51 y.o., male patient with h/o AUD who presented to the [facility] on 1/9 for assistance with alcohol detox and treatment, he was admitted to the [facility] for further treatment. 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 Etoh negative; UDS+methamphetamine, amphetamine, cocaine.  Patient seen and chart reviewed.  Most recent CIWA was 0.  Patient has been medication compliant with Ativan taper and has been appropriate with staff and peers on the unit.  Patient currently denies the  following alcohol withdrawal symptoms: nausea, vomiting, sweating, headaches, anxiety, palpitations, anorexia, GI upset. He denies SI/HI/AVH and reports mood as "ok".  Discussed with patient that Ativan taper will end on Friday in that there is a possibility that he may not be accepted to a rehab until after the weekend.  Patient stated that he could go back home to his mother in the interim until rehab bed is available if he is no longer meeting criteria to remain at the [facility]. Patient denied all other physical complaints. Patient was given the opportunity to ask questions and  All questions answered. Patient verbalized understanding regarding plan of care.    Diagnosis:  Final diagnoses:  Substance induced mood disorder (HCC)  Alcohol abuse    Total Time spent with patient: 15 minutes  Past Psychiatric History: alcohol use Past Medical History:  Past Medical History:  Diagnosis Date   Alcohol abuse     Past Surgical History:  Procedure Laterality Date   IRRIGATION AND DEBRIDEMENT ABSCESS N/A 08/14/2020   Procedure: IEXAM UNDER ANES. RRIGATION AND DEBRIDEMENT PERIRECTAL ABSCESS;  Surgeon: Luretha Murphy, MD;  Location: [facility];  Service: General;  Laterality: N/A;   Family History: No family history on file. Family Psychiatric   History: unknown Social History:  Social History   Substance and Sexual Activity  Alcohol Use Yes   Comment: daily     Social History   Substance and Sexual Activity  Drug Use Yes   Types: IV, Cocaine    Social History   Socioeconomic History   Marital status: Single    Spouse name: Not on file   Number of children: Not on file   Years of education: Not on file   Highest education level: Not on file  Occupational History   Not on file  Tobacco Use   Smoking status: Every Day    Types: Cigarettes   Smokeless tobacco: Never  Substance and Sexual Activity   Alcohol use: Yes    Comment: daily   Drug use: Yes    Types: IV, Cocaine   Sexual activity: Not on file  Other Topics Concern   Not on file  Social History Narrative   Not on file   Social Determinants of Health   Financial Resource Strain: Not on file  Food Insecurity: Not on file  Transportation Needs: Not on file  Physical Activity: Not on file  Stress: Not on file  Social Connections: Not on file   SDOH:  SDOH Screenings   Alcohol Screen: Not on file  Depression (JXB1-4): Not on file  Financial Resource Strain: Not on file  Food Insecurity: Not on file  Housing: Not on file  Physical Activity: Not on file  Social Connections: Not on file  Stress: Not on file  Tobacco Use: High Risk   Smoking Tobacco  Use: Every Day   Smokeless Tobacco Use: Never   Passive Exposure: Not on file  Transportation Needs: Not on file   Additional Social History:    Pain Medications: Patient denies Prescriptions: Patient denies Over the Counter: Patient denies History of alcohol / drug use?: Yes Longest period of sobriety (when/how long): Unknown Negative Consequences of Use:  (None reported) Withdrawal Symptoms: None Name of Substance 1: ETOH (Alcohol) 1 - Age of First Use: 51yo 1 - Amount (size/oz): 12 pack of beers 1 - Frequency: Daily 1 - Duration: 20+ years 1 - Last Use / Amount: 02/23/2021 - (12 pack of  beers) 1 - Method of Aquiring: Purchasing from stores 1- Route of Use: Drinking/Oral Name of Substance 2: Crack cocaine 2 - Age of First Use: 51yo 2 - Amount (size/oz): $100 worth - patient refers to this amount as a "16" 2 - Frequency: Weekly (reports using only on the weekends) 2 - Duration: 20 + years 2 - Last Use / Amount: 02/23/2021 - ($100 worth 2 - Method of Aquiring: Purchasing from dealers 2 - Route of Substance Use: Smoking                Sleep: Fair  Appetite:  Fair  Current Medications:  Current Facility-Administered Medications  Medication Dose Route Frequency Provider Last Rate Last Admin   acetaminophen (TYLENOL) tablet 650 mg  650 mg Oral Q6H PRN Ardis Hughs, NP       alum & mag hydroxide-simeth (MAALOX/MYLANTA) 200-200-20 MG/5ML suspension 30 mL  30 mL Oral Q4H PRN Ardis Hughs, NP       hydrOXYzine (ATARAX) tablet 25 mg  25 mg Oral Q6H PRN Ardis Hughs, NP       loperamide (IMODIUM) capsule 2-4 mg  2-4 mg Oral PRN Ardis Hughs, NP       LORazepam (ATIVAN) tablet 1 mg  1 mg Oral Q6H PRN Ardis Hughs, NP       LORazepam (ATIVAN) tablet 1 mg  1 mg Oral BID Ardis Hughs, NP       Followed by   Melene Muller ON 03/01/2021] LORazepam (ATIVAN) tablet 1 mg  1 mg Oral Daily Ardis Hughs, NP       magnesium hydroxide (MILK OF MAGNESIA) suspension 30 mL  30 mL Oral Daily PRN Ardis Hughs, NP       multivitamin with minerals tablet 1 tablet  1 tablet Oral Daily Ardis Hughs, NP   1 tablet at 02/27/21 0853   nicotine (NICODERM CQ - dosed in mg/24 hours) patch 21 mg  21 mg Transdermal Daily Vernard Gambles H, NP   21 mg at 02/27/21 0853   ondansetron (ZOFRAN-ODT) disintegrating tablet 4 mg  4 mg Oral Q6H PRN Ardis Hughs, NP       thiamine tablet 100 mg  100 mg Oral Daily Vernard Gambles H, NP   100 mg at 02/27/21 0853   traZODone (DESYREL) tablet 50 mg  50 mg Oral QHS PRN Ardis Hughs, NP       No current  outpatient medications on file.    Labs  Lab Results:  Admission on 02/25/2021  Component Date Value Ref Range Status   SARS Coronavirus 2 by RT PCR 02/25/2021 NEGATIVE  NEGATIVE Final   Comment: (NOTE) SARS-CoV-2 target nucleic acids are NOT DETECTED.  The SARS-CoV-2 RNA is generally detectable in upper respiratory specimens during the acute phase of infection. The lowest concentration of SARS-CoV-2  viral copies this assay can detect is 138 copies/mL. A negative result does not preclude SARS-Cov-2 infection and should not be used as the sole basis for treatment or other patient management decisions. A negative result may occur with  improper specimen collection/handling, submission of specimen other than nasopharyngeal swab, presence of viral mutation(s) within the areas targeted by this assay, and inadequate number of viral copies(<138 copies/mL). A negative result must be combined with clinical observations, patient history, and epidemiological information. The expected result is Negative.  Fact Sheet for Patients:  BloggerCourse.com  Fact Sheet for Healthcare Providers:  SeriousBroker.it  This test is no                          t yet approved or cleared by the [place] FDA and  has been authorized for detection and/or diagnosis of SARS-CoV-2 by FDA under an Emergency Use Authorization (EUA). This EUA will remain  in effect (meaning this test can be used) for the duration of the COVID-19 declaration under Section 564(b)(1) of the Act, 21 U.S.C.section 360bbb-3(b)(1), unless the authorization is terminated  or revoked sooner.       Influenza A by PCR 02/25/2021 NEGATIVE  NEGATIVE Final   Influenza B by PCR 02/25/2021 NEGATIVE  NEGATIVE Final   Comment: (NOTE) The Xpert Xpress SARS-CoV-2/FLU/RSV plus assay is intended as an aid in the diagnosis of influenza from Nasopharyngeal swab specimens and should not be used  as a sole basis for treatment. Nasal washings and aspirates are unacceptable for Xpert Xpress SARS-CoV-2/FLU/RSV testing.  Fact Sheet for Patients: BloggerCourse.com  Fact Sheet for Healthcare Providers: SeriousBroker.it  This test is not yet approved or cleared by the [place] FDA and has been authorized for detection and/or diagnosis of SARS-CoV-2 by FDA under an Emergency Use Authorization (EUA). This EUA will remain in effect (meaning this test can be used) for the duration of the COVID-19 declaration under Section 564(b)(1) of the Act, 21 U.S.C. section 360bbb-3(b)(1), unless the authorization is terminated or revoked.  Performed at [facility], [address] [place], [place] [address]    Hgb A1c MFr Bld 02/25/2021 4.9  4.8 - 5.6 % Final   Comment: (NOTE) Pre diabetes:          5.7%-6.4%  Diabetes:              >6.4%  Glycemic control for   <7.0% adults with diabetes    Mean Plasma Glucose 02/25/2021 93.93  mg/dL Final   Performed at [facility], [address] [place], [place] [address]   Cholesterol 02/25/2021 200  0 - 200 mg/dL Final   Triglycerides 57/84/6962 48  <150 mg/dL Final   HDL 95/28/4132 70  >40 mg/dL Final   Total CHOL/HDL Ratio 02/25/2021 2.9  RATIO Final   VLDL 02/25/2021 10  0 - 40 mg/dL Final   LDL Cholesterol 02/25/2021 120 (H)  0 - 99 mg/dL Final   Comment:        Total Cholesterol/HDL:CHD Risk Coronary Heart Disease Risk Table                     Men   Women  1/2 Average Risk   3.4   3.3  Average Risk       5.0   4.4  2 X Average Risk   9.6   7.1  3 X Average Risk  23.4   11.0  Use the calculated Patient Ratio above and the CHD Risk Table to determine the patient's CHD Risk.        ATP III CLASSIFICATION (LDL):  <100     mg/dL   Optimal  825-003  mg/dL   Near or Above                    Optimal  130-159  mg/dL   Borderline  704-888  mg/dL   High  >916      mg/dL   Very High Performed at [facility], [address] [place], [place] [address]    Alcohol, Ethyl (B) 02/25/2021 <10  <10 mg/dL Final   Comment: (NOTE) Lowest detectable limit for serum alcohol is 10 mg/dL.  For medical purposes only. Performed at [facility], [address], [place], [place] [address]    TSH 02/25/2021 0.839  0.350 - 4.500 uIU/mL Final   Comment: Performed by a 3rd Generation assay with a functional sensitivity of <=0.01 uIU/mL. Performed at [facility], [address], [place], [place] [address]    Chlamydia 02/25/2021 Negative   Final   Neisseria Gonorrhea 02/25/2021 Negative   Final   Comment 02/25/2021 Normal Reference Ranger Chlamydia - Negative   Final   Comment 02/25/2021 Normal Reference Range Neisseria Gonorrhea - Negative   Final   RPR Ser Ql 02/25/2021 Reactive (A)  NON REACTIVE Final   SENT FOR CONFIRMATION   RPR Titer 02/25/2021 1:1   Final   Performed at [facility], [address] [place], [place] [address]   POC Amphetamine UR 02/25/2021 Positive (A)  NONE DETECTED (Cut Off Level 1000 ng/mL) Preliminary   POC Secobarbital (BAR) 02/25/2021 None Detected  NONE DETECTED (Cut Off Level 300 ng/mL) Preliminary   POC Buprenorphine (BUP) 02/25/2021 None Detected  NONE DETECTED (Cut Off Level 10 ng/mL) Preliminary   POC Oxazepam (BZO) 02/25/2021 None Detected  NONE DETECTED (Cut Off Level 300 ng/mL) Preliminary   POC Cocaine UR 02/25/2021 Positive (A)  NONE DETECTED (Cut Off Level 300 ng/mL) Preliminary   POC Methamphetamine UR 02/25/2021 Positive (A)  NONE DETECTED (Cut Off Level 1000 ng/mL) Preliminary   POC Morphine 02/25/2021 None Detected  NONE DETECTED (Cut Off Level 300 ng/mL) Preliminary   POC Oxycodone UR 02/25/2021 None Detected  NONE DETECTED (Cut Off Level 100 ng/mL) Preliminary   POC Methadone UR 02/25/2021 None Detected  NONE DETECTED (Cut Off Level 300 ng/mL) Preliminary   POC Marijuana UR 02/25/2021 None  Detected  NONE DETECTED (Cut Off Level 50 ng/mL) Preliminary   SARSCOV2ONAVIRUS 2 AG 02/25/2021 NEGATIVE  NEGATIVE Final   Comment: (NOTE) SARS-CoV-2 antigen NOT DETECTED.   Negative results are presumptive.  Negative results do not preclude SARS-CoV-2 infection and should not be used as the sole basis for treatment or other patient management decisions, including infection  control decisions, particularly in the presence of clinical signs and  symptoms consistent with COVID-19, or in those who have been in contact with the virus.  Negative results must be combined with clinical observations, patient history, and epidemiological information. The expected result is Negative.  Fact Sheet for Patients: https://www.jennings-kim.com/  Fact Sheet for Healthcare Providers: https://alexander-rogers.biz/  This test is not yet approved or cleared by the [place] FDA and  has been authorized for detection and/or diagnosis of SARS-CoV-2 by FDA under an Emergency Use Authorization (EUA).  This EUA will remain in effect (meaning this test can be  used) for the duration of  the COV                          ID-19 declaration under Section 564(b)(1) of the Act, 21 U.S.C. section 360bbb-3(b)(1), unless the authorization is terminated or revoked sooner.    Admission on 02/24/2021, Discharged on 02/25/2021  Component Date Value Ref Range Status   Sodium 02/24/2021 136  135 - 145 mmol/L Final   Potassium 02/24/2021 4.5  3.5 - 5.1 mmol/L Final   DELTA CHECK NOTED   Chloride 02/24/2021 97 (L)  98 - 111 mmol/L Final   CO2 02/24/2021 28  22 - 32 mmol/L Final   Glucose, Bld 02/24/2021 98  70 - 99 mg/dL Final   Glucose reference range applies only to samples taken after fasting for at least 8 hours.   BUN 02/24/2021 20  6 - 20 mg/dL Final   Creatinine, Ser 02/24/2021 0.86  0.61 - 1.24 mg/dL Final   Calcium 16/10/960401/09/2021 9.7  8.9 - 10.3 mg/dL Final   Total Protein 54/09/811901/09/2021 9.4  (H)  6.5 - 8.1 g/dL Final   Albumin 14/78/295601/09/2021 4.8  3.5 - 5.0 g/dL Final   AST 21/30/865701/09/2021 31  15 - 41 U/L Final   ALT 02/24/2021 21  0 - 44 U/L Final   Alkaline Phosphatase 02/24/2021 60  38 - 126 U/L Final   Total Bilirubin 02/24/2021 1.7 (H)  0.3 - 1.2 mg/dL Final   GFR, Estimated 02/24/2021 >60  >60 mL/min Final   Comment: (NOTE) Calculated using the CKD-EPI Creatinine Equation (2021)    Anion gap 02/24/2021 11  5 - 15 Final   Performed at [facility], [address] [place], [place] [address]   Lipase 02/24/2021 24  11 - 51 U/L Final   Performed at [facility], [address] [place], [place] [address]   WBC 02/24/2021 4.7  4.0 - 10.5 K/uL Final   RBC 02/24/2021 4.89  4.22 - 5.81 MIL/uL Final   Hemoglobin 02/24/2021 14.5  13.0 - 17.0 g/dL Final   HCT 41/32/440101/09/2021 41.7  39.0 - 52.0 % Final   MCV 02/24/2021 85.3  80.0 - 100.0 fL Final   MCH 02/24/2021 29.7  26.0 - 34.0 pg Final   MCHC 02/24/2021 34.8  30.0 - 36.0 g/dL Final   RDW 02/72/536601/09/2021 11.9  11.5 - 15.5 % Final   Platelets 02/24/2021 331  150 - 400 K/uL Final   nRBC 02/24/2021 0.0  0.0 - 0.2 % Final   Neutrophils Relative % 02/24/2021 62  % Final   Neutro Abs 02/24/2021 2.9  1.7 - 7.7 K/uL Final   Lymphocytes Relative 02/24/2021 24  % Final   Lymphs Abs 02/24/2021 1.2  0.7 - 4.0 K/uL Final   Monocytes Relative 02/24/2021 12  % Final   Monocytes Absolute 02/24/2021 0.6  0.1 - 1.0 K/uL Final   Eosinophils Relative 02/24/2021 2  % Final   Eosinophils Absolute 02/24/2021 0.1  0.0 - 0.5 K/uL Final   Basophils Relative 02/24/2021 0  % Final   Basophils Absolute 02/24/2021 0.0  0.0 - 0.1 K/uL Final   Immature Granulocytes 02/24/2021 0  % Final   Abs Immature Granulocytes 02/24/2021 0.01  0.00 - 0.07 K/uL Final   Performed at [facility], [address] [place], [place] [address]   Color, Urine 02/25/2021 AMBER (A)  YELLOW Final   BIOCHEMICALS MAY BE AFFECTED BY COLOR  APPearance 02/25/2021 CLEAR  CLEAR Final   Specific Gravity, Urine 02/25/2021 1.032 (H)  1.005 - 1.030 Final   pH 02/25/2021 5.0  5.0 - 8.0 Final   Glucose, UA 02/25/2021 NEGATIVE  NEGATIVE mg/dL Final   Hgb urine dipstick 02/25/2021 NEGATIVE  NEGATIVE Final   Bilirubin Urine 02/25/2021 NEGATIVE  NEGATIVE Final   Ketones, ur 02/25/2021 80 (A)  NEGATIVE mg/dL Final   Protein, ur 21/30/865701/10/2021 100 (A)  NEGATIVE mg/dL Final   Nitrite 84/69/629501/10/2021 NEGATIVE  NEGATIVE Final   Leukocytes,Ua 02/25/2021 NEGATIVE  NEGATIVE Final   RBC / HPF 02/25/2021 0-5  0 - 5 RBC/hpf Final   WBC, UA 02/25/2021 0-5  0 - 5 WBC/hpf Final   Bacteria, UA 02/25/2021 RARE (A)  NONE SEEN Final   Squamous Epithelial / LPF 02/25/2021 0-5  0 - 5 Final   Mucus 02/25/2021 PRESENT   Final   Hyaline Casts, UA 02/25/2021 PRESENT   Final   Granular Casts, UA 02/25/2021 PRESENT   Final   Performed at [facility], [address] [place], [place] [address]   SARS Coronavirus 2 by RT PCR 02/25/2021 NEGATIVE  NEGATIVE Final   Comment: (NOTE) SARS-CoV-2 target nucleic acids are NOT DETECTED.  The SARS-CoV-2 RNA is generally detectable in upper respiratory specimens during the acute phase of infection. The lowest concentration of SARS-CoV-2 viral copies this assay can detect is 138 copies/mL. A negative result does not preclude SARS-Cov-2 infection and should not be used as the sole basis for treatment or other patient management decisions. A negative result may occur with  improper specimen collection/handling, submission of specimen other than nasopharyngeal swab, presence of viral mutation(s) within the areas targeted by this assay, and inadequate number of viral copies(<138 copies/mL). A negative result must be combined with clinical observations, patient history, and epidemiological information. The expected result is Negative.  Fact Sheet for Patients:  BloggerCourse.comhttps://www.fda.gov/media/152166/download  Fact  Sheet for Healthcare Providers:  SeriousBroker.ithttps://www.fda.gov/media/152162/download  This test is no                          t yet approved or cleared by the [place] FDA and  has been authorized for detection and/or diagnosis of SARS-CoV-2 by FDA under an Emergency Use Authorization (EUA). This EUA will remain  in effect (meaning this test can be used) for the duration of the COVID-19 declaration under Section 564(b)(1) of the Act, 21 U.S.C.section 360bbb-3(b)(1), unless the authorization is terminated  or revoked sooner.       Influenza A by PCR 02/25/2021 NEGATIVE  NEGATIVE Final   Influenza B by PCR 02/25/2021 NEGATIVE  NEGATIVE Final   Comment: (NOTE) The Xpert Xpress SARS-CoV-2/FLU/RSV plus assay is intended as an aid in the diagnosis of influenza from Nasopharyngeal swab specimens and should not be used as a sole basis for treatment. Nasal washings and aspirates are unacceptable for Xpert Xpress SARS-CoV-2/FLU/RSV testing.  Fact Sheet for Patients: BloggerCourse.comhttps://www.fda.gov/media/152166/download  Fact Sheet for Healthcare Providers: SeriousBroker.ithttps://www.fda.gov/media/152162/download  This test is not yet approved or cleared by the [place] FDA and has been authorized for detection and/or diagnosis of SARS-CoV-2 by FDA under an Emergency Use Authorization (EUA). This EUA will remain in effect (meaning this test can be used) for the duration of the COVID-19 declaration under Section 564(b)(1) of the Act, 21 U.S.C. section 360bbb-3(b)(1), unless the authorization is terminated or revoked.  Performed at [facility], [address] [place], [place]  [address]   Admission on 02/24/2021, Discharged on 02/24/2021  Component Date Value Ref Range Status   Glucose-Capillary 02/24/2021 110 (H)  70 - 99 mg/dL Final   Glucose reference range applies only to samples taken after fasting for at least 8 hours.   WBC 02/24/2021 6.3  4.0 - 10.5 K/uL Final   RBC 02/24/2021 4.58   4.22 - 5.81 MIL/uL Final   Hemoglobin 02/24/2021 13.6  13.0 - 17.0 g/dL Final   HCT 60/45/4098 39.4  39.0 - 52.0 % Final   MCV 02/24/2021 86.0  80.0 - 100.0 fL Final   MCH 02/24/2021 29.7  26.0 - 34.0 pg Final   MCHC 02/24/2021 34.5  30.0 - 36.0 g/dL Final   RDW 11/91/4782 11.9  11.5 - 15.5 % Final   Platelets 02/24/2021 313  150 - 400 K/uL Final   nRBC 02/24/2021 0.0  0.0 - 0.2 % Final   Neutrophils Relative % 02/24/2021 62  % Final   Neutro Abs 02/24/2021 3.9  1.7 - 7.7 K/uL Final   Lymphocytes Relative 02/24/2021 25  % Final   Lymphs Abs 02/24/2021 1.6  0.7 - 4.0 K/uL Final   Monocytes Relative 02/24/2021 10  % Final   Monocytes Absolute 02/24/2021 0.7  0.1 - 1.0 K/uL Final   Eosinophils Relative 02/24/2021 1  % Final   Eosinophils Absolute 02/24/2021 0.1  0.0 - 0.5 K/uL Final   Basophils Relative 02/24/2021 1  % Final   Basophils Absolute 02/24/2021 0.0  0.0 - 0.1 K/uL Final   Immature Granulocytes 02/24/2021 1  % Final   Abs Immature Granulocytes 02/24/2021 0.04  0.00 - 0.07 K/uL Final   Performed at [facility], [address], [place], [place] [address]   Alcohol, Ethyl (B) 02/24/2021 <10  <10 mg/dL Final   Comment: (NOTE) Lowest detectable limit for serum alcohol is 10 mg/dL.  For medical purposes only. Performed at [facility], [address] [place], [place] [address]    Sodium 02/24/2021 137  135 - 145 mmol/L Final   Potassium 02/24/2021 3.4 (L)  3.5 - 5.1 mmol/L Final   Chloride 02/24/2021 99  98 - 111 mmol/L Final   CO2 02/24/2021 27  22 - 32 mmol/L Final   Glucose, Bld 02/24/2021 125 (H)  70 - 99 mg/dL Final   Glucose reference range applies only to samples taken after fasting for at least 8 hours.   BUN 02/24/2021 20  6 - 20 mg/dL Final   Creatinine, Ser 02/24/2021 0.83  0.61 - 1.24 mg/dL Final   Calcium 78/46/9629 9.4  8.9 - 10.3 mg/dL Final   Total Protein 52/84/1324 9.2 (H)  6.5 - 8.1 g/dL Final   Albumin 40/11/2723  4.8  3.5 - 5.0 g/dL Final   AST 36/64/4034 31  15 - 41 U/L Final   ALT 02/24/2021 21  0 - 44 U/L Final   Alkaline Phosphatase 02/24/2021 61  38 - 126 U/L Final   Total Bilirubin 02/24/2021 1.5 (H)  0.3 - 1.2 mg/dL Final   GFR, Estimated 02/24/2021 >60  >60 mL/min Final   Comment: (NOTE) Calculated using the CKD-EPI Creatinine Equation (2021)    Anion gap 02/24/2021 11  5 - 15 Final   Performed at [facility], [address], [place], [place] [address]   Troponin I (High Sensitivity) 02/24/2021 4  <18 ng/L Final   Comment: (NOTE) Elevated high sensitivity troponin I (hsTnI) values and significant  changes across serial  measurements may suggest ACS but many other  chronic and acute conditions are known to elevate hsTnI results.  Refer to the "Links" section for chest pain algorithms and additional  guidance. Performed at [facility], [address] [place], [place] [address]   Admission on 12/18/2020, Discharged on 12/18/2020  Component Date Value Ref Range Status   WBC 12/18/2020 6.1  4.0 - 10.5 K/uL Final   RBC 12/18/2020 5.16  4.22 - 5.81 MIL/uL Final   Hemoglobin 12/18/2020 15.0  13.0 - 17.0 g/dL Final   HCT 81/19/1478 44.2  39.0 - 52.0 % Final   MCV 12/18/2020 85.7  80.0 - 100.0 fL Final   MCH 12/18/2020 29.1  26.0 - 34.0 pg Final   MCHC 12/18/2020 33.9  30.0 - 36.0 g/dL Final   RDW 29/56/2130 12.7  11.5 - 15.5 % Final   Platelets 12/18/2020 291  150 - 400 K/uL Final   nRBC 12/18/2020 0.0  0.0 - 0.2 % Final   Performed at [facility], [address], [place], [place] [address]   Sodium 12/18/2020 134 (L)  135 - 145 mmol/L Final   Potassium 12/18/2020 3.4 (L)  3.5 - 5.1 mmol/L Final   Chloride 12/18/2020 94 (L)  98 - 111 mmol/L Final   CO2 12/18/2020 30  22 - 32 mmol/L Final   Glucose, Bld 12/18/2020 158 (H)  70 - 99 mg/dL Final   Glucose reference range applies only to samples taken after fasting for at least 8  hours.   BUN 12/18/2020 11  6 - 20 mg/dL Final   Creatinine, Ser 12/18/2020 0.76  0.61 - 1.24 mg/dL Final   Calcium 46/96/2952 9.6  8.9 - 10.3 mg/dL Final   Total Protein 84/13/2440 8.4 (H)  6.5 - 8.1 g/dL Final   Albumin 12/14/2534 4.0  3.5 - 5.0 g/dL Final   AST 64/40/3474 209 (H)  15 - 41 U/L Final   ALT 12/18/2020 104 (H)  0 - 44 U/L Final   Alkaline Phosphatase 12/18/2020 61  38 - 126 U/L Final   Total Bilirubin 12/18/2020 1.0  0.3 - 1.2 mg/dL Final   GFR, Estimated 12/18/2020 >60  >60 mL/min Final   Comment: (NOTE) Calculated using the CKD-EPI Creatinine Equation (2021)    Anion gap 12/18/2020 10  5 - 15 Final   Performed at [facility], [address], [place], [place] [address]   Lipase 12/18/2020 38  11 - 51 U/L Final   Performed at [facility], [address], [place], [place] [address]   SARS Coronavirus 2 by RT PCR 12/18/2020 NEGATIVE  NEGATIVE Final   Comment: (NOTE) SARS-CoV-2 target nucleic acids are NOT DETECTED.  The SARS-CoV-2 RNA is generally detectable in upper respiratory specimens during the acute phase of infection. The lowest concentration of SARS-CoV-2 viral copies this assay can detect is 138 copies/mL. A negative result does not preclude SARS-Cov-2 infection and should not be used as the sole basis for treatment or other patient management decisions. A negative result may occur with  improper specimen collection/handling, submission of specimen other than nasopharyngeal swab, presence of viral mutation(s) within the areas targeted by this assay, and inadequate number of viral copies(<138 copies/mL). A negative result must be combined with clinical observations, patient history, and epidemiological information. The expected result is Negative.  Fact Sheet for Patients:  BloggerCourse.com  Fact Sheet for Healthcare Providers:  SeriousBroker.it  This test is no  t yet approved or cleared by the [place] and  has been authorized for detection and/or diagnosis of SARS-CoV-2 by FDA under an Emergency Use Authorization (EUA). This EUA will remain  in effect (meaning this test can be used) for the duration of the COVID-19 declaration under Section 564(b)(1) of the Act, 21 U.S.C.section 360bbb-3(b)(1), unless the authorization is terminated  or revoked sooner.       Influenza A by PCR 12/18/2020 NEGATIVE  NEGATIVE Final   Influenza B by PCR 12/18/2020 NEGATIVE  NEGATIVE Final   Comment: (NOTE) The Xpert Xpress SARS-CoV-2/FLU/RSV plus assay is intended as an aid in the diagnosis of influenza from Nasopharyngeal swab specimens and should not be used as a sole basis for treatment. Nasal washings and aspirates are unacceptable for Xpert Xpress SARS-CoV-2/FLU/RSV testing.  Fact Sheet for Patients: BloggerCourse.com  Fact Sheet for Healthcare Providers: SeriousBroker.it  This test is not yet approved or cleared by the [place] FDA and has been authorized for detection and/or diagnosis of SARS-CoV-2 by FDA under an Emergency Use Authorization (EUA). This EUA will remain in effect (meaning this test can be used) for the duration of the COVID-19 declaration under Section 564(b)(1) of the Act, 21 U.S.C. section 360bbb-3(b)(1), unless the authorization is terminated or revoked.  Performed at [facility], [address], [place], [place] [address]    Color, Urine 12/18/2020 YELLOW  YELLOW Final   APPearance 12/18/2020 CLEAR  CLEAR Final   Specific Gravity, Urine 12/18/2020 1.018  1.005 - 1.030 Final   pH 12/18/2020 5.0  5.0 - 8.0 Final   Glucose, UA 12/18/2020 NEGATIVE  NEGATIVE mg/dL Final   Hgb urine dipstick 12/18/2020 SMALL (A)  NEGATIVE Final   Bilirubin Urine 12/18/2020 NEGATIVE  NEGATIVE Final   Ketones, ur 12/18/2020 20 (A)  NEGATIVE  mg/dL Final   Protein, ur 95/28/4132 NEGATIVE  NEGATIVE mg/dL Final   Nitrite 44/02/270 NEGATIVE  NEGATIVE Final   Leukocytes,Ua 12/18/2020 NEGATIVE  NEGATIVE Final   RBC / HPF 12/18/2020 0-5  0 - 5 RBC/hpf Final   WBC, UA 12/18/2020 0-5  0 - 5 WBC/hpf Final   Bacteria, UA 12/18/2020 RARE (A)  NONE SEEN Final   Mucus 12/18/2020 PRESENT   Final   Performed at [facility], [address], [place], [place] [address]   Hepatitis B Surface Ag 12/18/2020 NON REACTIVE  NON REACTIVE Final   HCV Ab 12/18/2020 Reactive (A)  NON REACTIVE Final   Comment: (NOTE) The CDC recommends that a Reactive HCV antibody result be followed up  with a HCV Nucleic Acid Amplification test.     Hep A IgM 12/18/2020 NON REACTIVE  NON REACTIVE Final   Hep B C IgM 12/18/2020 NON REACTIVE  NON REACTIVE Final   Performed at [facility], [address] [place], [place] [address]  Admission on 11/25/2020, Discharged on 11/25/2020  Component Date Value Ref Range Status   WBC 11/25/2020 4.2  4.0 - 10.5 K/uL Final   RBC 11/25/2020 4.70  4.22 - 5.81 MIL/uL Final   Hemoglobin 11/25/2020 13.9  13.0 - 17.0 g/dL Final   HCT 42/59/5638 40.7  39.0 - 52.0 % Final   MCV 11/25/2020 86.6  80.0 - 100.0 fL Final   MCH 11/25/2020 29.6  26.0 - 34.0 pg Final   MCHC 11/25/2020 34.2  30.0 - 36.0 g/dL Final   RDW 75/64/3329 12.6  11.5 - 15.5 % Final   Platelets 11/25/2020 321  150 -  400 K/uL Final   nRBC 11/25/2020 0.0  0.0 - 0.2 % Final   Neutrophils Relative % 11/25/2020 56  % Final   Neutro Abs 11/25/2020 2.4  1.7 - 7.7 K/uL Final   Lymphocytes Relative 11/25/2020 33  % Final   Lymphs Abs 11/25/2020 1.4  0.7 - 4.0 K/uL Final   Monocytes Relative 11/25/2020 9  % Final   Monocytes Absolute 11/25/2020 0.4  0.1 - 1.0 K/uL Final   Eosinophils Relative 11/25/2020 1  % Final   Eosinophils Absolute 11/25/2020 0.0  0.0 - 0.5 K/uL Final   Basophils Relative 11/25/2020 1  % Final   Basophils Absolute 11/25/2020  0.0  0.0 - 0.1 K/uL Final   Immature Granulocytes 11/25/2020 0  % Final   Abs Immature Granulocytes 11/25/2020 0.01  0.00 - 0.07 K/uL Final   Performed at [facility], [address] [place], [place] [address]   Sodium 11/25/2020 138  135 - 145 mmol/L Final   Potassium 11/25/2020 3.6  3.5 - 5.1 mmol/L Final   Chloride 11/25/2020 105  98 - 111 mmol/L Final   CO2 11/25/2020 24  22 - 32 mmol/L Final   Glucose, Bld 11/25/2020 120 (H)  70 - 99 mg/dL Final   Glucose reference range applies only to samples taken after fasting for at least 8 hours.   BUN 11/25/2020 6  6 - 20 mg/dL Final   Creatinine, Ser 11/25/2020 0.76  0.61 - 1.24 mg/dL Final   Calcium 81/19/1478 8.2 (L)  8.9 - 10.3 mg/dL Final   Total Protein 29/56/2130 8.2 (H)  6.5 - 8.1 g/dL Final   Albumin 86/57/8469 3.9  3.5 - 5.0 g/dL Final   AST 62/95/2841 24  15 - 41 U/L Final   ALT 11/25/2020 15  0 - 44 U/L Final   Alkaline Phosphatase 11/25/2020 63  38 - 126 U/L Final   Total Bilirubin 11/25/2020 0.6  0.3 - 1.2 mg/dL Final   GFR, Estimated 11/25/2020 >60  >60 mL/min Final   Comment: (NOTE) Calculated using the CKD-EPI Creatinine Equation (2021)    Anion gap 11/25/2020 9  5 - 15 Final   Performed at [facility], [address] [place], [place] [address]   Alcohol, Ethyl (B) 11/25/2020 335 (HH)  <10 mg/dL Final   Comment: CRITICAL RESULT CALLED TO, READ BACK BY AND VERIFIED WITH: SOUTHARD, RN @ [phone] ON 11/25/2020 BY LBROOKS, MLT (NOTE) Lowest detectable limit for serum alcohol is 10 mg/dL.  For medical purposes only. Performed at [facility], [address], [place], [place] [address]    Salicylate Lvl 11/25/2020 <7.0 (L)  7.0 - 30.0 mg/dL Final   Performed at [facility], [address] [place], [place] [address]   Acetaminophen (Tylenol), Serum 11/25/2020 <10 (L)  10 - 30 ug/mL Final   Comment: (NOTE) Therapeutic concentrations vary  significantly. A range of 10-30 ug/mL  may be an effective concentration for many patients. However, some  are best treated at concentrations outside of this range. Acetaminophen concentrations >150 ug/mL at 4 hours after ingestion  and >50 ug/mL at 12 hours after ingestion are often associated with  toxic reactions.  Performed at [facility], [address], [place], [place] [address]     Blood Alcohol level:  Lab Results  Component Value Date   [facility] <10 02/25/2021   ETH <10 02/24/2021    Metabolic Disorder Labs: Lab Results  Component Value Date  HGBA1C 4.9 02/25/2021   MPG 93.93 02/25/2021   No results found for: PROLACTIN Lab Results  Component Value Date   CHOL 200 02/25/2021   TRIG 48 02/25/2021   HDL 70 02/25/2021   CHOLHDL 2.9 02/25/2021   VLDL 10 02/25/2021   LDLCALC 120 (H) 02/25/2021    Therapeutic Lab Levels: No results found for: LITHIUM No results found for: VALPROATE No components found for:  CBMZ  Physical Findings   Flowsheet Row ED from 02/25/2021 in [facility] ED from 02/24/2021 in [facility] ED from 12/18/2020 in [facility]  C-SSRS RISK CATEGORY No Risk No Risk No Risk        Musculoskeletal  Strength & Muscle Tone: within normal limits Gait & Station: normal Patient leans: N/A  Psychiatric Specialty Exam  Presentation  General Appearance: Appropriate for Environment; Casual  Eye Contact:Fair  Speech:Clear and Coherent; Normal Rate  Speech Volume:Normal  Handedness:Right   Mood and Affect  Mood:Euthymic  Affect:Appropriate; Congruent   Thought Process  Thought Processes:Coherent; Goal Directed; Linear  Descriptions of Associations:Intact  Orientation:Full (Time, Place and Person)  Thought Content:WDL; Logical  Diagnosis of Schizophrenia or Schizoaffective disorder in past: No     Hallucinations:Hallucinations: None  Ideas of Reference:None  Suicidal Thoughts:Suicidal Thoughts: No  Homicidal Thoughts:Homicidal Thoughts: No   Sensorium  Memory:Immediate Good; Recent Good; Remote Fair  Judgment:Fair  Insight:Good   Executive Functions  Concentration:Good  Attention Span:Good  Recall:Good  Fund of Knowledge:Good  Language:Good   Psychomotor Activity  Psychomotor Activity:Psychomotor Activity: Normal   Assets  Assets:Communication Skills; Desire for Improvement; Financial Resources/Insurance; Housing; Physical Health; Resilience   Sleep  Sleep:Sleep: Fair   No data recorded   Physical Exam  Physical Exam Constitutional:      Appearance: Normal appearance. He is normal weight.  HENT:     Head: Normocephalic and atraumatic.  Eyes:     Extraocular Movements: Extraocular movements intact.  Pulmonary:     Effort: Pulmonary effort is normal.  Neurological:     General: No focal deficit present.     Mental Status: He is alert and oriented to person, place, and time.  Psychiatric:        Attention and Perception: Attention and perception normal.        Speech: Speech normal.        Behavior: Behavior normal. Behavior is cooperative.        Thought Content: Thought content normal.   Review of Systems  Constitutional:  Negative for chills and fever.  HENT:  Negative for hearing loss.   Eyes:  Negative for discharge and redness.  Respiratory:  Negative for cough.   Cardiovascular:  Negative for chest pain.  Gastrointestinal:  Negative for abdominal pain, nausea and vomiting.  Musculoskeletal:  Negative for myalgias.  Neurological:  Negative for tremors and headaches.  Psychiatric/Behavioral:  Positive for substance abuse. Negative for depression, hallucinations and suicidal ideas.   Blood pressure (!) 145/109, pulse 82, temperature 97.6 F (36.4 C), temperature source Oral, resp. rate 19, SpO2 98 %. There is no height or weight on  file to calculate BMI.  Treatment Plan Summary: 51 y.o., male patient with h/o AUD who presented to the [facility] on 1/9 for assistance with alcohol detox and treatment, he was admitted to [facility] for [facility] treatment. 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 Etoh negative;  UDS+methamphetamine, amphetamine, cocaine.   Patient denies SI/HI/AVH.  Tolerating Ativan taper well.  Most recent CIWA was 0.  Patient denying symptoms of alcohol withdrawal.  Patient remains appropriate for continued treatment on the [facility] to assist with alcohol detox.  Anticipate discharged on Friday to residential rehab once Ativan taper is completed.   AUD, severe -continue CIWA protocol -continue ativan detox- 1 mg QID --> 1 mg TID--> 1 mg BID--> 1 mg daily -multivitamin -thiamine  Dispo: ongoing. SW assisting. Anticipate discharge on Friday to residential rehab after completion of tap   Estella Husk, MD 02/27/2021 4:31 PM

## 2021-02-27 NOTE — ED Notes (Signed)
AA is now taking place in the dining room and the patient is attending.

## 2021-02-27 NOTE — ED Notes (Signed)
Patient attended Group Meeting the title was How We Grow, it covered Self Esteem, self care tips and Marslow's Hierachy of Needs, Values and Goal Setting, The Patient participated in the meeting he was a little shy at first Then gave his opinion on self care and self esteem, patient was engaging and was agreeable on most of the Subject discussed. Meeting time was 11am -12pm.

## 2021-02-27 NOTE — ED Notes (Signed)
Pts medication have been given and patient is now in bed °

## 2021-02-27 NOTE — Clinical Social Work Psych Note (Signed)
CSW Update   Mark Thornton reports feeling "okay" this morning. Mark Thornton remained lying in bed, with the covers over his head while speaking to this Clinical research associate.   Mark Thornton expressed that he was still agreeable with participating in residential treatment services (rehab), following his discharge from the [facility].    Mark Thornton reports receiving little sleep last night. He denied having any SI, HI or AVH at this time.  Per Dr. Bronwen Betters, MD, Mark Thornton's Ativan taper will end Friday morning. CSW has referred Mark Thornton to the following facilities for review:  [facility]   CSW will continue to follow for potential placement.     Baldo Daub, MSW, LCSW Clinical Child psychotherapist (Facility Based Crisis) [facility]

## 2021-02-27 NOTE — Progress Notes (Signed)
Pt's CIWA was 0. 

## 2021-02-27 NOTE — ED Notes (Signed)
Pt is sleeping the bed at present. Respirations are even and unlabored. No acute distress noted. Will continue to monitor safety.

## 2021-02-27 NOTE — Clinical Social Work Psych Note (Signed)
Cognitive Distortions & Restructuring  Cognitive Distortions & Cognitive Restructuring (CBT & DBT Skills)  Date: 02/27/21  Type of Therapy/Therapeutic Modalities:  Participation Level: Minimal  Objective: The purpose of this group is to discuss and assist patients in identifying cognitive distortions (negative thinking patterns) that can influence their emotions and behaviors. Facilitators will guide conversations that discuss how these cognitive distortions contribute to common disorders such as anxiety and depression.   Therapeutic Goals:  Patient will identify negative thinking patterns they currently experience and how those thoughts influence their behaviors.  Patient will begin to explore the possible misinformation and/or traumatic experiences that influence their negative thought patterns.  Patient will explore the foundations and techniques of cognitive restructuring by reviewing CBT and DBT techniques.  Patient will discuss the   Summary of Patients Progress:  Mark Thornton was attentive during the group session, however he did not speak or contribute to the group's discussion.

## 2021-02-27 NOTE — Progress Notes (Signed)
Pt is presently in group therapy. No distress noted. Pt has been visible in the milieu. No complaints voiced. Monitoring for pt's safety.

## 2021-02-27 NOTE — Progress Notes (Signed)
Pt is awake, alert and oriented. Pt did not voice any complaints of pain or discomfort. No signs of acute distress noted. Administered scheduled meds with no incident. Pt denies current SI/HI/AVH. Staff will monitor for pt's safety. 

## 2021-02-27 NOTE — ED Notes (Signed)
Pt in dayroom calm and cooperative. No c/o pain or distress. Will continue to monitor  for safety 

## 2021-02-27 NOTE — ED Notes (Signed)
Pt is currently sleeping, no distress noted, environmental check complete, will continue to monitor patient for safety. ? ?

## 2021-02-28 DIAGNOSIS — Z20822 Contact with and (suspected) exposure to covid-19: Secondary | ICD-10-CM | POA: Diagnosis not present

## 2021-02-28 DIAGNOSIS — F1994 Other psychoactive substance use, unspecified with psychoactive substance-induced mood disorder: Secondary | ICD-10-CM | POA: Diagnosis not present

## 2021-02-28 DIAGNOSIS — F17210 Nicotine dependence, cigarettes, uncomplicated: Secondary | ICD-10-CM | POA: Diagnosis not present

## 2021-02-28 DIAGNOSIS — F1010 Alcohol abuse, uncomplicated: Secondary | ICD-10-CM | POA: Diagnosis not present

## 2021-02-28 MED ORDER — ADULT MULTIVITAMIN W/MINERALS CH: 1.0000 | ORAL_TABLET | Freq: Every day | ORAL | 1 refills | Status: AC

## 2021-02-28 MED ORDER — AMLODIPINE BESYLATE 5 MG PO TABS: 5.0000 mg | ORAL_TABLET | Freq: Every day | ORAL | 1 refills | Status: DC

## 2021-02-28 MED ORDER — THIAMINE HCL 100 MG PO TABS: 100.0000 mg | ORAL_TABLET | Freq: Every day | ORAL | 1 refills | Status: DC

## 2021-02-28 MED ORDER — AMLODIPINE BESYLATE 5 MG PO TABS
5.0000 mg | ORAL_TABLET | Freq: Every day | ORAL | Status: DC
  Administered 2021-02-28 – 2021-03-01 (×2): 5 mg via ORAL
  Filled 2021-02-28: qty 1
  Filled 2021-02-28: qty 20
  Filled 2021-02-28: qty 1

## 2021-02-28 MED ORDER — NICOTINE 21 MG/24HR TD PT24: 21.0000 mg | MEDICATED_PATCH | Freq: Every day | TRANSDERMAL | 0 refills | Status: AC

## 2021-02-28 NOTE — ED Notes (Signed)
Pt eating breakfast 

## 2021-02-28 NOTE — Progress Notes (Signed)
Pt's CIWA was 0. 

## 2021-02-28 NOTE — ED Notes (Signed)
Pt sleeping@this time. Breathing even and unlabored. Will continue to monitor for safety 

## 2021-02-28 NOTE — Progress Notes (Signed)
Pt is watching TV quietly in the dinning area. No distress noted or concern voiced. Pt's safety is maintained.

## 2021-02-28 NOTE — ED Notes (Signed)
Pt is currently sleeping, no distress noted, environmental check complete, will continue to monitor patient for safety. ? ?

## 2021-02-28 NOTE — Progress Notes (Signed)
Pt is awake, alert and oriented. Pt did not voice any complaints of pain or discomfort. No signs of acute distress noted. Administered scheduled meds with no incident. Pt denies current SI/HI/AVH. Staff will monitor for pt's safety. 

## 2021-02-28 NOTE — ED Notes (Signed)
Pt has taken his night time medications.

## 2021-02-28 NOTE — ED Notes (Signed)
Pt was in dining room watching television, he just came to nurses station asking for medication

## 2021-02-28 NOTE — Group Note (Signed)
Group Topic: Overcoming Obstacles  Group Date: 02/28/2021 Start Time: 1000 End Time: 1100 Facilitators: Adaline Sill D, NT  Department: [facility]  Number of Participants: 4  Group Focus: acceptance, coping skills, daily focus, family, and self-esteem Treatment Modality:  Behavior Modification Therapy Interventions utilized were clarification, problem solving, reality testing, and story telling Purpose: enhance coping skills, express feelings, increase insight, and   Name: Mark Thornton Date of Birth: 02/07/1971  MR: [id number]    Level of Participation: when cued Quality of Participation: cooperative Interactions with others: gave feedback Mood/Affect: appropriate Triggers (if applicable):  Cognition: concrete Progress: Minimal Response:  Plan: patient will be encouraged to   Patients Problems:  Patient Active Problem List   Diagnosis Date Noted   Substance induced mood disorder ([place]) 02/25/2021   Perirectal abscess 08/13/2020   Hyponatremia 08/13/2020   Hypokalemia 08/13/2020   Intermittent explosive disorder 12/11/2019   Psychosis, affective ([place]) 12/11/2019   Alcohol abuse 12/11/2019

## 2021-02-28 NOTE — Progress Notes (Signed)
SPIRITUALITY GROUP NOTE  Spirituality group facilitated by Simone Curia, MDiv, [place].  Group Description: Group focused on topic of hope. Patients participated in facilitated discussion around topic, connecting with one another around experiences and definitions for hope. Group members engaged with visual explorer photos, reflecting on what hope looks like for them today. Group engaged in discussion around how their definitions of hope are present today in hospital.  Modalities: Psycho-social ed, Adlerian, Narrative, MI  Patient Progress: [place] was present throughout group.  Identified hope as "seeing my life in new and different ways and thinking about what I want my life to be going forward."  Spoke group members about his faith and how he stays connected to this.  He shared about his discharge tomorrow and spoke with group about hope coming from having a plan.  SPoke about his plan for support the 3 days he will be home before going to rehab.  He identified his family and especially sisters and having boundaries with folks who are not helpful.  Was receptive to group pushing him for specifics around plan and spoke with another member about AA.

## 2021-02-28 NOTE — Progress Notes (Signed)
Pt is resting quietly in his room. No distress noted. No concerns voiced. Monitoring for pt's safety.

## 2021-02-28 NOTE — ED Provider Notes (Signed)
FBC/OBS ASAP Discharge Summary  Date and Time: 02/28/2021 4:58 PM  Name: Mark Thornton  MRN:  [id number]   Discharge Diagnoses:  Final diagnoses:  Substance induced mood disorder ([place])  Alcohol abuse    Subjective:    Patient seen and chart reviewed.  Most recent CIWA was 0.  Patient has been medication compliant with Ativan taper and has been appropriate with staff and peers on the unit.  Patient currently denies the  following alcohol withdrawal symptoms: nausea, vomiting, sweating, headaches, anxiety, palpitations, anorexia, GI upset. He denies SI/HI/AVH and reports mood as "good"  Discussed with patient that Ativan taper will end tomorrow. Patient verbalizes understanding and states that he called his mother and told her that he will discharge tomorrow and has a bed at [facility] on Tuesday 1/17 for rehab. Patient expresses looking forward to receiving treatment at [facility].  Patient states that he will notify his  mother to pick him up around noon tomorrow.  Stay Summary:  51 y.o., male patient with h/o AUD who presented to the [facility] on 1/9 for assistance with alcohol detox and treatment, he was admitted to the [facility] for further treatment and was placed on CIWA protocol and started on ativan taper for alcohol detox. 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 Etoh negative; UDS+methamphetamine, amphetamine, cocaine. Patient completed ativan taper for alcohol detox on 1/13; patient completed taper without having to receive PRN ativan for elevated CIWA scores. Patient noted to be hypertensive on 1/12 and was started on norvasc 5 mg daily for hypertension.  Throughout his stay, he was appropriate with peers and staff on the unit , attended groups and was medication compliant.  Patient was accepted today [facility] on Tuesday 1/17 for rehab.Patient declined starting medications for mood or anxiety during his admission.  On routine labs,  RPR was reactive; however, treponemal antibodies were non reactive. This is c/w a false positive test and therefore treatment was not given.   Upon completion of this admission [name] was both mentally and medically stable for discharge denying suicidal/homicidal ideation, symptoms that would be consistent with psychosis (AVH, IOR, paranoia, etc).   On my interview today, day of discharge, , patient is in NAD, alert, oriented, calm, cooperative, and attentive, with normal affect, speech, and behavior. Objectively, there is no evidence of psychosis/ mania (able to converse coherently, linear and goal directed thought, no RIS, no distractibility, not pre-occupied, no FOI, etc) nor depression to the point of suicidality (able to concentrate, affect full and reactive, speech normal r/v/t, no psychomotor retardation/agitation, etc).  Overall, patient appears to be at the point, in the absence of inhibiting or disinhibiting symptoms, where he can successfully move to lesser restrictive setting for care.    Total Time spent with patient: 15 minutes  Past Psychiatric History: substance use Past Medical History:  Past Medical History:  Diagnosis Date   Alcohol abuse     Past Surgical History:  Procedure Laterality Date   IRRIGATION AND DEBRIDEMENT ABSCESS N/A 08/14/2020   Procedure: IEXAM UNDER ANES. [place] AND DEBRIDEMENT PERIRECTAL ABSCESS;  Surgeon: Johnathan Hausen, MD;  Location: [facility];  Service: General;  Laterality: N/A;   Family History: No family history on file. Family Psychiatric History: unknown Social History:  Social History   Substance and Sexual Activity  Alcohol Use Yes   Comment: daily     Social History   Substance and Sexual Activity  Drug Use Yes   Types: IV, Cocaine    Social History   Socioeconomic History   Marital status: Single    Spouse name: Not on file   Number of children: Not on file   Years of education: Not on file   Highest  education level: Not on file  Occupational History   Not on file  Tobacco Use   Smoking status: Every Day    Types: Cigarettes   Smokeless tobacco: Never  Substance and Sexual Activity   Alcohol use: Yes    Comment: daily   Drug use: Yes    Types: IV, Cocaine   Sexual activity: Not on file  Other Topics Concern   Not on file  Social History Narrative   Not on file   Social Determinants of Health   Financial Resource Strain: Not on file  Food Insecurity: Not on file  Transportation Needs: Not on file  Physical Activity: Not on file  Stress: Not on file  Social Connections: Not on file   SDOH:  SDOH Screenings   Alcohol Screen: Not on file  Depression (PHQ2-9): Not on file  Financial Resource Strain: Not on file  Food Insecurity: Not on file  Housing: Not on file  Physical Activity: Not on file  Social Connections: Not on file  Stress: Not on file  Tobacco Use: High Risk   Smoking Tobacco Use: Every Day   Smokeless Tobacco Use: Never   Passive Exposure: Not on file  Transportation Needs: Not on file    Tobacco Cessation:  A prescription for an FDA-approved tobacco cessation medication provided at discharge  Current Medications:  Current Facility-Administered Medications  Medication Dose Route Frequency Provider Last Rate Last Admin   acetaminophen (TYLENOL) tablet 650 mg  650 mg Oral Q6H PRN Revonda Humphrey, NP       alum & mag hydroxide-simeth (MAALOX/MYLANTA) 200-200-20 MG/5ML suspension 30 mL  30 mL Oral Q4H PRN Revonda Humphrey, NP       amLODipine (NORVASC) tablet 5 mg  5 mg Oral Daily Ival Bible, MD   5 mg at 02/28/21 1605   [START ON 03/01/2021] LORazepam (ATIVAN) tablet 1 mg  1 mg Oral Daily Revonda Humphrey, NP       magnesium hydroxide (MILK OF MAGNESIA) suspension 30 mL  30 mL Oral Daily PRN Revonda Humphrey, NP       multivitamin with minerals tablet 1 tablet  1 tablet Oral Daily Revonda Humphrey, NP   1 tablet at 02/28/21 0914    nicotine (NICODERM CQ - dosed in mg/24 hours) patch 21 mg  21 mg Transdermal Daily Thomes Lolling H, NP   21 mg at 02/28/21 0914   thiamine tablet 100 mg  100 mg Oral Daily Revonda Humphrey, NP   100 mg at 02/28/21 0913   traZODone (DESYREL) tablet 50 mg  50 mg Oral QHS PRN Revonda Humphrey, NP   50 mg at 02/27/21 2130   Current Outpatient Medications  Medication Sig Dispense Refill   amLODipine (NORVASC) 5 MG tablet Take 1 tablet (5 mg total) by mouth daily. 30 tablet 1   [START ON 03/01/2021] Multiple Vitamin (MULTIVITAMIN WITH MINERALS) TABS tablet Take 1 tablet by mouth daily. 30 tablet 1   [START ON 03/01/2021] nicotine (NICODERM CQ - DOSED IN MG/24 HOURS) 21 mg/24hr patch Place 1 patch (21 mg total) onto the skin daily. 28 patch 0   [START ON 03/01/2021] thiamine 100 MG tablet  Take 1 tablet (100 mg total) by mouth daily. 30 tablet 1    PTA Medications: (Not in a hospital admission)   Musculoskeletal  Strength & Muscle Tone: within normal limits Gait & Station: normal Patient leans: N/A  Psychiatric Specialty Exam  Presentation  General Appearance: Appropriate for Environment; Casual  Eye Contact:Good  Speech:Clear and Coherent; Normal Rate  Speech Volume:Normal  Handedness:Right   Mood and Affect  Mood:Euthymic  Affect:Congruent   Thought Process  Thought Processes:Goal Directed; Linear  Descriptions of Associations:Intact  Orientation:Full (Time, Place and Person)  Thought Content:WDL; Logical  Diagnosis of Schizophrenia or Schizoaffective disorder in past: No    Hallucinations:Hallucinations: None  Ideas of Reference:None  Suicidal Thoughts:Suicidal Thoughts: No  Homicidal Thoughts:Homicidal Thoughts: No   Sensorium  Memory:Immediate Good; Recent Good; Remote Good  Judgment:Good  Insight:Good   Executive Functions  Concentration:Good  Attention Span:Good  [place] of Knowledge:Good  Language:Good   Psychomotor Activity   Psychomotor Activity:Psychomotor Activity: Normal   Assets  Assets:Communication Skills; Desire for Improvement; Financial Resources/Insurance; Housing; Physical Health; Resilience; Social Support   Sleep  Sleep:Sleep: Good   No data recorded  Physical Exam  Physical Exam Constitutional:      Appearance: Normal appearance. He is normal weight.  HENT:     Head: Normocephalic and atraumatic.  Eyes:     Extraocular Movements: Extraocular movements intact.  Pulmonary:     Effort: Pulmonary effort is normal.  Neurological:     General: No focal deficit present.     Mental Status: He is alert and oriented to person, place, and time.  Psychiatric:        Attention and Perception: Attention and perception normal.        Speech: Speech normal.        Behavior: Behavior normal. Behavior is cooperative.        Thought Content: Thought content normal.   Review of Systems  Constitutional:  Negative for chills and fever.  HENT:  Negative for hearing loss.   Eyes:  Negative for discharge and redness.  Respiratory:  Negative for cough.   Cardiovascular:  Negative for chest pain.  Gastrointestinal:  Negative for abdominal pain.  Musculoskeletal:  Negative for myalgias.  Neurological:  Negative for headaches.  Psychiatric/Behavioral:  Positive for substance abuse. Negative for depression and suicidal ideas.   Blood pressure (!) 143/95, pulse 84, temperature 97.9 F (36.6 C), temperature source Tympanic, resp. rate 18, SpO2 100 %. There is no height or weight on file to calculate BMI.  Demographic Factors:  Male and Low socioeconomic status  Loss Factors: NA  Historical Factors: NA  Risk Reduction Factors:   Sense of responsibility to family, Religious beliefs about death, Living with another person, especially a relative, and Positive social support  Continued Clinical Symptoms:  Alcohol/Substance Abuse/Dependencies  Cognitive Features That Contribute To Risk:  None     Suicide Risk:  Minimal: No identifiable suicidal ideation.  Patients presenting with no risk factors but with morbid ruminations; may be classified as minimal risk based on the severity of the depressive symptoms  Plan Of Care/Follow-up recommendations:  Activity:  as tolerated Diet:  regular Other:    Take all medications as prescribed by his/her mental healthcare provider. Report any adverse effects and or reactions from the medicines to your outpatient provider promptly. Do not engage in alcohol and or illegal drug use while on prescription medicines. In the event of worsening symptoms, call the crisis hotline, 911 and or go  to the nearest ED for appropriate evaluation and treatment of symptoms. follow-up with your primary care provider for your other medical issues, concerns and or health care needs.  Allergies as of 02/28/2021   No Known Allergies      Medication List     TAKE these medications    amLODipine 5 MG tablet Commonly known as: NORVASC Take 1 tablet (5 mg total) by mouth daily.   multivitamin with minerals Tabs tablet Take 1 tablet by mouth daily. Start taking on: March 01, 2021   nicotine 21 mg/24hr patch Commonly known as: NICODERM CQ - dosed in mg/24 hours Place 1 patch (21 mg total) onto the skin daily. Start taking on: March 01, 2021   thiamine 100 MG tablet Take 1 tablet (100 mg total) by mouth daily. Start taking on: March 01, 2021        Patient was provided with 7 day samples of above medications as well as paper prescriptions for 30 days with one refill at time of discharge.  Patient was provided with follow up information regarding psychiatric outpatient resources in AVS with the assistance of SW prior to discharge.      Disposition: self care; home  Ival Bible, MD 02/28/2021, 4:58 PM

## 2021-02-28 NOTE — ED Notes (Addendum)
Pt has eaten a snack in the dining room. He is now headed to his room. This nurse advised patient that he has medications at 9 pm.

## 2021-03-01 DIAGNOSIS — F17210 Nicotine dependence, cigarettes, uncomplicated: Secondary | ICD-10-CM | POA: Diagnosis not present

## 2021-03-01 DIAGNOSIS — F1994 Other psychoactive substance use, unspecified with psychoactive substance-induced mood disorder: Secondary | ICD-10-CM | POA: Diagnosis not present

## 2021-03-01 DIAGNOSIS — Z20822 Contact with and (suspected) exposure to covid-19: Secondary | ICD-10-CM | POA: Diagnosis not present

## 2021-03-01 DIAGNOSIS — F1010 Alcohol abuse, uncomplicated: Secondary | ICD-10-CM | POA: Diagnosis not present

## 2021-03-01 NOTE — Progress Notes (Signed)
Pt is awake, alert and oriented. Pt did not voice any complaints of pain or discomfort. No signs of acute distress noted. Administered scheduled meds with no incident. Pt denies current SI/HI/AVH. Staff will monitor for pt's safety. 

## 2021-03-01 NOTE — ED Notes (Signed)
Pt is currently sleeping, no distress noted, environmental check complete, will continue to monitor patient for safety. ? ?

## 2021-03-01 NOTE — Clinical Social Work Psych Note (Signed)
CSW Discharge Note   Mark Thornton reports feeling "good" today. He denied having any SI, HI or AVH. He denied having any concerns or needs at this time.   Mark Thornton reports he is ready for discharge and continues to plan to go to [facility] for continuity of care next week.   Mark Thornton is scheduled for a Screening for Admission appointment on Tuesday, 03/05/2021 at 7:45am. Mark Thornton reports he spoke with his mother and she stated she would take him to his appointment.   LCSW has charted follow up instructions and resources in the patient's AVS for discharge.   Mark Thornton reports his mother is picking him up today at 12PM for discharge.   He denied having any other questions or concerns.    CSW will continue to follow until discharge.    Baldo Daub, MSW, LCSW Clinical Child psychotherapist (Facility Based Crisis) [facility]

## 2021-03-01 NOTE — Discharge Instructions (Signed)

## 2021-03-01 NOTE — Discharge Summary (Signed)
Mark Thornton to be D/C'd Home per MD order. Discussed with the patient and all questions fully answered. An After Visit Summary was printed and given to the patient. Medication samples and scripts were also given to patient. Patient escorted out and D/C home via private auto.  Dickie La  03/01/2021 12:03 PM

## 2021-03-11 ENCOUNTER — Telehealth (HOSPITAL_COMMUNITY): Payer: Self-pay | Admitting: Emergency Medicine

## 2021-03-11 NOTE — BH Assessment (Signed)
Care Management - FBC Follow Up Discharges   Writer attempted to make contact with patient today and was unsuccessful.  No voicemail and the phone just rang.     Per chart review, patient was provided with substance abuse outpatient resources

## 2021-04-17 ENCOUNTER — Ambulatory Visit: Payer: Self-pay | Attending: Family Medicine | Admitting: Family Medicine

## 2021-04-17 ENCOUNTER — Other Ambulatory Visit: Payer: Self-pay

## 2021-04-17 ENCOUNTER — Encounter: Payer: Self-pay | Admitting: Family Medicine

## 2021-04-17 ENCOUNTER — Ambulatory Visit: Payer: Self-pay | Admitting: Family Medicine

## 2021-04-17 ENCOUNTER — Ambulatory Visit
Admission: RE | Admit: 2021-04-17 | Discharge: 2021-04-17 | Disposition: A | Discharge status: Home / Self Care | Payer: Self-pay | Source: Ambulatory Visit | Attending: Family Medicine | Admitting: Family Medicine

## 2021-04-17 VITALS — BP 158/100 | HR 81 | Ht 70.0 in | Wt 192.0 lb

## 2021-04-17 DIAGNOSIS — M79641 Pain in right hand: Secondary | ICD-10-CM

## 2021-04-17 DIAGNOSIS — I10 Essential (primary) hypertension: Secondary | ICD-10-CM | POA: Insufficient documentation

## 2021-04-17 DIAGNOSIS — M65331 Trigger finger, right middle finger: Secondary | ICD-10-CM

## 2021-04-17 MED ORDER — AMLODIPINE BESYLATE 5 MG PO TABS
5.0000 mg | ORAL_TABLET | Freq: Every day | ORAL | 1 refills | Status: DC
  Filled 2021-04-17: qty 30, 30d supply, fill #0

## 2021-04-17 MED ORDER — THIAMINE HCL 100 MG PO TABS
100.0000 mg | ORAL_TABLET | Freq: Every day | ORAL | 1 refills | Status: DC
  Filled 2021-04-17: qty 30, 30d supply, fill #0

## 2021-04-17 NOTE — Progress Notes (Signed)
? ?Subjective:  ?Patient ID: Mark Thornton, male    DOB: Jul 17, 1970  Age: 51 y.o. MRN: [id number] ? ?CC: Hospitalization Follow-up ? ? ?HPI ?Mark Thornton is a 51 y.o. year old male with a history of alcohol use disorder, substance abuse ?He was seen at the ED last month for alcohol abuse after he had presented requesting assistance for alcohol detox and treatment.  Urine drug screen was positive for cocaine, amphetamine, methamphetamine. ?He underwent detox program and was placed on amlodipine for hypertension. ?Subsequently discharged to rehab. ?Per notes he had declined medications for mood or anxiety. ? ?Interval History: ?He got out of rehab 3 weeks ago and states he slipped and drank 3 beers while he was out with his friends. He has not started NA meetings yet and is yet to start [place] meetings but states the NA meetings is also high-priority for him.  He does have all the resources he needs with regards to meeting times and locations. ? ?Complains of R middle finger sticking  and he has to use his other hand to straighten it. Symptoms have been present for 6 weeks and he is unsure if when he fell after his oversdose he injured it. ? ? ? ?Past Medical History:  ?Diagnosis Date  ? Alcohol abuse   ? ? ?Past Surgical History:  ?Procedure Laterality Date  ? IRRIGATION AND DEBRIDEMENT ABSCESS N/A 08/14/2020  ? Procedure: IEXAM UNDER ANES. [place] AND DEBRIDEMENT PERIRECTAL ABSCESS;  Surgeon: Johnathan Hausen, MD;  Location: [facility];  Service: General;  Laterality: N/A;  ? ? ?History reviewed. No pertinent family history. ? ?No Known Allergies ? ?Outpatient Medications Prior to Visit  ?Medication Sig Dispense Refill  ? Multiple Vitamin (MULTIVITAMIN WITH MINERALS) TABS tablet Take 1 tablet by mouth daily. 30 tablet 1  ? amLODipine (NORVASC) 5 MG tablet Take 1 tablet (5 mg total) by mouth daily. 30 tablet 1  ? thiamine 100 MG tablet Take 1 tablet (100 mg total) by mouth daily. 30 tablet 1  ? nicotine (NICODERM CQ  - DOSED IN MG/24 HOURS) 21 mg/24hr patch Place 1 patch (21 mg total) onto the skin daily. (Patient not taking: Reported on 04/17/2021) 28 patch 0  ? ?No facility-administered medications prior to visit.  ? ? ? ?ROS ?Review of Systems  ?Constitutional:  Negative for activity change and appetite change.  ?HENT:  Negative for sinus pressure and sore throat.   ?Eyes:  Negative for visual disturbance.  ?Respiratory:  Negative for cough, chest tightness and shortness of breath.   ?Cardiovascular:  Negative for chest pain and leg swelling.  ?Gastrointestinal:  Negative for abdominal distention, abdominal pain, constipation and diarrhea.  ?Endocrine: Negative.   ?Genitourinary:  Negative for dysuria.  ?Musculoskeletal:   ?     See HPI  ?Skin:  Negative for rash.  ?Allergic/Immunologic: Negative.   ?Neurological:  Negative for weakness, light-headedness and numbness.  ?Psychiatric/Behavioral:  Negative for dysphoric mood and suicidal ideas.   ? ?Objective:  ?BP (!) 158/100   Pulse 81   Ht 5\' 10"  (1.778 m)   Wt 192 lb (87.1 kg)   SpO2 100%   BMI 27.55 kg/m?  ? ?BP/Weight 04/17/2021 03/01/2021 02/25/2021  ?Systolic BP 0000000 [id number]  ?Diastolic BP 123XX123 123XX123 [id number]  ?Wt. (Lbs) 192 - -  ?BMI 27.55 - -  ? ? ? ? ?Physical Exam ?Constitutional:   ?   Appearance: He is well-developed.  ?Cardiovascular:  ?   Rate and Rhythm: Normal rate.  ?  Heart sounds: Normal heart sounds. No murmur heard. ?Pulmonary:  ?   Effort: Pulmonary effort is normal.  ?   Breath sounds: Normal breath sounds. No wheezing or rales.  ?Chest:  ?   Chest wall: No tenderness.  ?Abdominal:  ?   General: Bowel sounds are normal. There is no distension.  ?   Palpations: Abdomen is soft. There is no mass.  ?   Tenderness: There is no abdominal tenderness.  ?Musculoskeletal:  ?   Right lower leg: No edema.  ?   Left lower leg: No edema.  ?   Comments: Tenderness on palpation of right middle finger proximal phalanx.  No tenderness on palpation of PIP or MCP joints, unable to  make a complete fist and right hand. ?Left hand is normal  ?Neurological:  ?   Mental Status: He is alert and oriented to person, place, and time.  ?Psychiatric:     ?   Mood and Affect: Mood normal.  ? ? ?CMP Latest Ref Rng & Units 02/24/2021 02/24/2021 12/18/2020  ?Glucose 70 - 99 mg/dL 98 125(H) 158(H)  ?BUN 6 - 20 mg/dL 20 20 11   ?Creatinine 0.61 - 1.24 mg/dL 0.86 0.83 0.76  ?Sodium 135 - 145 mmol/L 136 137 134(L)  ?Potassium 3.5 - 5.1 mmol/L 4.5 3.4(L) 3.4(L)  ?Chloride 98 - 111 mmol/L 97(L) 99 94(L)  ?CO2 22 - 32 mmol/L 28 27 30   ?Calcium 8.9 - 10.3 mg/dL 9.7 9.4 9.6  ?Total Protein 6.5 - 8.1 g/dL 9.4(H) 9.2(H) 8.4(H)  ?Total Bilirubin 0.3 - 1.2 mg/dL 1.7(H) 1.5(H) 1.0  ?Alkaline Phos 38 - 126 U/L 60 61 61  ?AST 15 - 41 U/L 31 31 209(H)  ?ALT 0 - 44 U/L 21 21 104(H)  ? ? ?Lipid Panel  ?   ?Component Value Date/Time  ? CHOL 200 02/25/2021 1429  ? TRIG 48 02/25/2021 1429  ? HDL 70 02/25/2021 1429  ? CHOLHDL 2.9 02/25/2021 1429  ? VLDL 10 02/25/2021 1429  ? LDLCALC 120 (H) 02/25/2021 1429  ? ? ?CBC ?   ?Component Value Date/Time  ? WBC 4.7 02/24/2021 1929  ? RBC 4.89 02/24/2021 1929  ? HGB 14.5 02/24/2021 1929  ? HCT 41.7 02/24/2021 1929  ? PLT 331 02/24/2021 1929  ? MCV 85.3 02/24/2021 1929  ? MCH 29.7 02/24/2021 1929  ? MCHC 34.8 02/24/2021 1929  ? RDW 11.9 02/24/2021 1929  ? LYMPHSABS 1.2 02/24/2021 1929  ? MONOABS 0.6 02/24/2021 1929  ? EOSABS 0.1 02/24/2021 1929  ? BASOSABS 0.0 02/24/2021 1929  ? ? ?Lab Results  ?Component Value Date  ? HGBA1C 4.9 02/25/2021  ? ? ?Assessment & Plan:  ?1. Essential hypertension ?Uncontrolled ?He endorses compliance with amlodipine ?I will see him back in 3 weeks to reassess his blood pressure and increase dose of amlodipine if blood pressure still elevated at that time ? ?2. Pain of right hand ?He attributes pain to his fall when he was intoxicated ?He does have additional trigger finger ?Advised that trigger finger will need to be treated with cortisone injections but he would  like to have an x-ray first ?I will follow-up on this at his next visit ?Unable to place on NSAID due to uncontrolled blood pressure ?- DG Hand Complete Right ? ?3. Trigger middle finger of right hand ?See #2 above ? ? ? ?Meds ordered this encounter  ?Medications  ? amLODipine (NORVASC) 5 MG tablet  ?  Sig: Take 1 tablet (5 mg total) by mouth  daily.  ?  Dispense:  30 tablet  ?  Refill:  1  ? thiamine 100 MG tablet  ?  Sig: Take 1 tablet (100 mg total) by mouth daily.  ?  Dispense:  30 tablet  ?  Refill:  1  ? ? ?Follow-up: Return in about 3 weeks (around 05/08/2021) for Blood Pressure follow-up.  ? ? ? ? ? ?Charlott Rakes, MD, FAAFP. ?[place] ?[place], [place] ?[phone]   ?04/17/2021, 10:02 AM ?

## 2021-04-17 NOTE — Progress Notes (Signed)
Ring ring finger pain. ?

## 2021-04-18 ENCOUNTER — Telehealth: Payer: Self-pay

## 2021-04-18 NOTE — Telephone Encounter (Signed)
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

## 2021-04-18 NOTE — Telephone Encounter (Signed)
Patient name and DOB has been verified Patient was informed of lab results. Patient had no questions.  

## 2021-04-22 ENCOUNTER — Ambulatory Visit (HOSPITAL_COMMUNITY)
Admission: EM | Admit: 2021-04-22 | Discharge: 2021-04-22 | Disposition: A | Discharge status: Home / Self Care | Payer: No Payment, Other | Attending: Psychiatry | Admitting: Psychiatry

## 2021-04-22 DIAGNOSIS — F1410 Cocaine abuse, uncomplicated: Secondary | ICD-10-CM | POA: Insufficient documentation

## 2021-04-22 DIAGNOSIS — F1010 Alcohol abuse, uncomplicated: Secondary | ICD-10-CM | POA: Insufficient documentation

## 2021-04-22 NOTE — ED Triage Notes (Signed)
Pt presents to [facility] seeking substance use treatment. Pt states that he was just released from a recovery program on February 15th. Pt states that he relapsed on alcohol this week and would like to get assistance. Pt denies SI/HI and AVH. ?

## 2021-04-22 NOTE — Discharge Instructions (Signed)
Take all medications as prescribed. Keep all follow-up appointments as scheduled.  Do not consume alcohol or use illegal drugs while on prescription medications. Report any adverse effects from your medications to your primary care provider promptly.  In the event of recurrent symptoms or worsening symptoms, call 911, a crisis hotline, or go to the nearest emergency department for evaluation.   

## 2021-04-22 NOTE — Discharge Summary (Signed)
Mercie Eon to be D/C'd Home per NP order. An After Visit Summary was printed and given to the patient by provider. Patient escorted out and D/C home via private auto.  ?Dickie La  ?04/22/2021 11:35 AM ?  ?   ?

## 2021-04-22 NOTE — ED Provider Notes (Addendum)
Behavioral Health Urgent Care Medical Screening Exam ? ?Patient Name: Mark Thornton ?MRN: [id number] ?Date of Evaluation: 04/22/21 ?Chief Complaint:   ?Diagnosis:  ?Final diagnoses:  ?Alcohol abuse  ?Cocaine abuse (HCC)  ? ? ?History of Present illness: Mark Thornton is a 51 y.o. male.  Presents to [facility] urgent care accompanied with his mother.  He is requesting resources for detox.  He reports he was recently discharged from [facility] and had a recent relapse 1 week after discharging. "  I wanted go back to [facility] if I can?"  He is denying suicidal or homicidal ideations.  Denies auditory visual hallucinations.  Denies that he is followed by therapy or psychiatry.  Denies that he is taking psychotropic medications.  Reports he last used cocaine and alcohol 1 night ago. ? ?NP spoke to CSW regarding outpatient resources.  Resources was provided for residential treatment services ( RTS), adult drug services (ADS) and [facility].  ? ?Mark Thornton, 51 y.o., male patient seen face to face by this provider and chart reviewed on 04/22/21.  On evaluation Mark Thornton reports ? ? ?During evaluation Mark Thornton is sitting  in no acute distress. He is alert/oriented x 4; calm/cooperative; and mood congruent with affect.  He is speaking in a clear tone at moderate volume, and normal pace; with good eye contact.  His thought process is coherent and relevant; There is no indication that he is currently responding to internal/external stimuli or experiencing delusional thought content; and he has denied suicidal/self-harm/homicidal ideation, psychosis, and paranoia.  Patient has remained calm throughout assessment and has answered questions appropriately.   ? ? ?At this time Mark Thornton is educated and verbalizes understanding of mental health resources and other crisis services in the community. He is instructed to call 911 and present to the nearest emergency room should he experience any  suicidal/homicidal ideation . ? ?auditory/visual/hallucinations, or detrimental worsening of his mental health condition.  He was a also advised by Clinical research associate that he could call the toll-free phone on insurance card to assist with identifying in network counselors and agencies or number on back of Medicaid card t speak with care coordinator ?  ? ?Psychiatric Specialty Exam ? ?Presentation  ?General Appearance:Appropriate for Environment; Casual ? ?Eye Contact:Good ? ?Speech:Clear and Coherent; Normal Rate ? ?Speech Volume:Normal ? ?Handedness:Right ? ? ?Mood and Affect  ?Mood:Euthymic ? ?Affect:Congruent ? ? ?Thought Process  ?Thought Processes:Goal Directed; Linear ? ?Descriptions of Associations:Intact ? ?Orientation:Full (Time, Place and Person) ? ?Thought Content:WDL; Logical ? Diagnosis of Schizophrenia or Schizoaffective disorder in past: No ?  Hallucinations:None ? ?Ideas of Reference:None ? ?Suicidal Thoughts:No ? ?Homicidal Thoughts:No ? ? ?Sensorium  ?Memory:Immediate Good; Recent Good; Remote Good ? ?Judgment:Good ? ?Insight:Good ? ? ?Executive Functions  ?Concentration:Good ? ?Attention Span:Good ? ?Recall:Good ? ?Fund of Knowledge:Good ? ?Language:Good ? ? ?Psychomotor Activity  ?Psychomotor Activity:Normal ? ? ?Assets  ?Assets:Communication Skills; Desire for Improvement; Financial Resources/Insurance; Housing; Physical Health; Resilience; Social Support ? ? ?Sleep  ?Sleep:Good ? ?Number of hours: 0 ? ? ?No data recorded ? ?Physical Exam: ?Physical Exam ?Vitals and nursing note reviewed.  ?Pulmonary:  ?   Effort: Pulmonary effort is normal.  ?Musculoskeletal:  ?   Cervical back: Normal range of motion.  ?Skin: ?   General: Skin is warm.  ?Neurological:  ?   Mental Status: He is alert and oriented to person, place, and time.  ?Psychiatric:     ?   Mood and Affect: Mood normal.     ?  Behavior: Behavior normal.     ?   Thought Content: Thought content normal.  ? ?Review of Systems  ?HENT: Negative.     ?Eyes: Negative.   ?Cardiovascular: Negative.   ?Gastrointestinal: Negative.   ?Psychiatric/Behavioral:  Positive for substance abuse. Negative for depression and suicidal ideas. The patient is not nervous/anxious.   ?All other systems reviewed and are negative. ?Blood pressure (!) 150/96, pulse 79, temperature 98.5 ?F (36.9 ?C), temperature source Oral, resp. rate 18, SpO2 99 %. There is no height or weight on file to calculate BMI. ? ?Musculoskeletal: ?Strength & Muscle Tone: within normal limits ?Gait & Station: normal ?Patient leans: N/A ? ? ?[facility] MSE Discharge Disposition for Follow up and Recommendations: ?Based on my evaluation I certify that psychiatric inpatient services furnished can reasonably be expected to improve the patient's condition which I recommend transfer to an appropriate accepting facility.  ?Based on my evaluation the patient does not appear to have an emergency medical condition and can be discharged with resources and follow up care in outpatient services for Substance Abuse Intensive Outpatient Program ? ? ?Oneta Rack, NP ?04/22/2021, 2:37 PM ? ?

## 2021-04-30 ENCOUNTER — Ambulatory Visit: Payer: Self-pay | Admitting: *Deleted

## 2021-04-30 NOTE — Telephone Encounter (Signed)
Per agent: ?"[place] is at [place] [facility] [place] Nurse would like to know if patient should keep his 05/08/2021 appointment. As per caller patient BP is ranging from 159/100 and 160/98 no other symptoms and PCP stated at last OV if it continues will increase BP. Caller states if PCP wants to see patient [place] will provide transportation." ? ?Pt's mother stated received call for [facility] with above request. Advised pt should keep appt, to go ahead and arrange for transportation. Reviewed appt date and times. Advised to continue monitoring BP and journal.Verbalizes understanding ? ? ? ? ? ? ?Reason for Disposition ? General information question, no triage required and triager able to answer question ? ?Answer Assessment - Initial Assessment Questions ?1. REASON FOR CALL or QUESTION: "What is your reason for calling today?" or "How can I best help you?" or "What question do you have that I can help answer?" ?    Keep appt? ? ?Protocols used: Information Only Call - No Triage-A-AH ? ?

## 2021-05-01 NOTE — Telephone Encounter (Addendum)
Left message on voicemail to keep appointment to f/u BP  ?

## 2021-05-08 ENCOUNTER — Ambulatory Visit: Payer: Self-pay | Admitting: Family Medicine

## 2021-05-08 ENCOUNTER — Telehealth (HOSPITAL_COMMUNITY): Payer: Self-pay | Admitting: Emergency Medicine

## 2021-05-08 NOTE — BH Assessment (Signed)
Care Management - [facility] Follow Up Discharges  ? ?Writer attempted to make contact with patient today and was unsuccessful.  Writer left a HIPPA compliant voice message.  ? ?Per chart review, patient was provided with outpatient substance abuse resources. ? ?

## 2021-05-16 ENCOUNTER — Encounter: Payer: Self-pay | Admitting: Physician Assistant

## 2021-05-16 ENCOUNTER — Ambulatory Visit (INDEPENDENT_AMBULATORY_CARE_PROVIDER_SITE_OTHER): Payer: Self-pay | Admitting: Physician Assistant

## 2021-05-16 VITALS — BP 142/94 | HR 73 | Temp 98.3°F | Resp 18 | Ht 70.0 in | Wt 204.0 lb

## 2021-05-16 DIAGNOSIS — G4700 Insomnia, unspecified: Secondary | ICD-10-CM

## 2021-05-16 DIAGNOSIS — M65331 Trigger finger, right middle finger: Secondary | ICD-10-CM

## 2021-05-16 DIAGNOSIS — F1410 Cocaine abuse, uncomplicated: Secondary | ICD-10-CM

## 2021-05-16 DIAGNOSIS — I10 Essential (primary) hypertension: Secondary | ICD-10-CM

## 2021-05-16 DIAGNOSIS — E559 Vitamin D deficiency, unspecified: Secondary | ICD-10-CM

## 2021-05-16 DIAGNOSIS — F1010 Alcohol abuse, uncomplicated: Secondary | ICD-10-CM

## 2021-05-16 DIAGNOSIS — Z125 Encounter for screening for malignant neoplasm of prostate: Secondary | ICD-10-CM

## 2021-05-16 DIAGNOSIS — Z6829 Body mass index (BMI) 29.0-29.9, adult: Secondary | ICD-10-CM

## 2021-05-16 MED ORDER — AMLODIPINE BESYLATE 10 MG PO TABS: 10.0000 mg | ORAL_TABLET | Freq: Every day | ORAL | 1 refills | Status: DC

## 2021-05-16 NOTE — Progress Notes (Signed)
Patient has eaten today and patient has taken medication today. ?Patient reports arthritis in the R middle finger. Joint locked up last night. ? ?

## 2021-05-16 NOTE — Patient Instructions (Signed)
You are going to start taking an increased dose of amlodipine 10 mg once daily.  I encourage you to continue checking your blood pressure twice daily, keep a written log and have available for all office visits.  You will follow-up with the mobile team in 2 weeks. ? ?To help with your insomnia, I encourage you to do a trial of melatonin. ? ?I started a referral for you to be seen by orthopedics for your trigger finger. ? ?Roney Jaffe, PA-C ?Physician Assistant ?[facility] ?[email or web address] ? ? ?Trigger Finger ?Trigger finger, also called stenosing tenosynovitis,  is a condition that causes a finger to get stuck in a bent position. Each finger has a tendon, which is a tough, cord-like tissue that connects muscle to bone, and each tendon passes through a tunnel of tissue called a tendon sheath. ?To move your finger, your tendon needs to glide freely through the sheath. Trigger finger happens when the tendon or the sheath thickens, making it difficult to move your finger. Trigger finger can affect any finger or a thumb. It may affect more than one finger. Mild cases may clear up with rest and medicine. Severe cases require more treatment. ?What are the causes? ?Trigger finger is caused by a thickened finger tendon or tendon sheath. The cause of this thickening is not known. ?What increases the risk? ?The following factors may make you more likely to develop this condition: ?Doing activities that require a strong grip. ?Having rheumatoid arthritis, gout, or diabetes. ?Being 32-71 years old. ?Being male. ?What are the signs or symptoms? ?Symptoms of this condition include: ?Pain when bending or straightening your finger. ?Tenderness or swelling where your finger attaches to the palm of your hand. ?A lump in the palm of your hand or on the inside of your finger. ?Hearing a noise like a pop or a snap when you try to straighten your finger. ?Feeling a catching  or locking sensation when you try to straighten your finger. ?Being unable to straighten your finger. ?How is this diagnosed? ?This condition is diagnosed based on your symptoms and a physical exam. ?How is this treated? ?This condition may be treated by: ?Resting your finger and avoiding activities that make symptoms worse. ?Wearing a finger splint to keep your finger extended. ?Taking NSAIDs, such as ibuprofen, to relieve pain and swelling. ?Doing gentle exercises to stretch the finger as told by your health care provider. ?Having medicine that reduces swelling and inflammation (steroids) injected into the tendon sheath. Injections may need to be repeated. ?Having surgery to open the tendon sheath. This may be done if other treatments do not work and you cannot straighten your finger. You may need physical therapy after surgery. ?Follow these instructions at home: ?If you have a splint: ?Wear the splint as told by your health care provider. Remove it only as told by your health care provider. ?Loosen it if your fingers tingle, become numb, or turn cold and blue. ?Keep it clean. ?If the splint is not waterproof: ?Do not let it get wet. ?Cover it with a watertight covering when you take a bath or shower. ?Managing pain, stiffness, and swelling ?  ?If directed, apply heat to the affected area as often as told by your health care provider. Use the heat source that your health care provider recommends, such as a moist heat pack or a heating pad. ?Place a towel between your skin and the heat source. ?Leave the heat on for 20-30 minutes. ?Remove the heat  if your skin turns bright red. This is especially important if you are unable to feel pain, heat, or cold. You may have a greater risk of getting burned. ?If directed, put ice on the painful area. To do this: ?If you have a removable splint, remove it as told by your health care provider. ?Put ice in a plastic bag. ?Place a towel between your skin and the bag or between  your splint and the bag. ?Leave the ice on for 20 minutes, 2-3 times a day. ? ?Activity ?Rest your finger as told by your health care provider. Avoid activities that make the pain worse. ?Return to your normal activities as told by your health care provider. Ask your health care provider what activities are safe for you. ?Do exercises as told by your health care provider. ?Ask your health care provider when it is safe to drive if you have a splint on your hand. ?General instructions ?Take over-the-counter and prescription medicines only as told by your health care provider. ?Keep all follow-up visits as told by your health care provider. This is important. ?Contact a health care provider if: ?Your symptoms are not improving with home care. ?Summary ?Trigger finger, also called stenosing tenosynovitis, causes your finger to get stuck in a bent position. This can make it difficult and painful to straighten your finger. ?This condition develops when a finger tendon or tendon sheath thickens. ?Treatment may include resting your finger, wearing a splint, and taking medicines. ?In severe cases, surgery to open the tendon sheath may be needed. ?This information is not intended to replace advice given to you by your health care provider. Make sure you discuss any questions you have with your health care provider. ?Document Revised: 06/21/2018 Document Reviewed: 06/21/2018 ?Elsevier Patient Education ? 2022 Elsevier Inc. ? ?

## 2021-05-16 NOTE — Progress Notes (Signed)
? ?Established Patient Office Visit ? ?Subjective:  ?Patient ID: Mark Thornton, male    DOB: 09/29/1970  Age: 51 y.o. MRN: [id number] ? ?CC:  ?Chief Complaint  ?Patient presents with  ? Hypertension  ? Arthritis  ?  R middle finger  ? ? ?HPI ?Mark Thornton reports that he is currently being treated for substance abuse at [facility] residential treatment center, states that he arrived on April 30, 2021.  States that he has not made any decisions regarding his aftercare. ? ?States that he was started on amlodipine 5 mg by his primary care provider, states that he has been taking his blood pressure twice daily, has been having elevated blood pressure readings, similar to today and slightly higher.  Denies hypertensive symptoms. ? ? ?States that he does continue to have intermittent pain in his right middle finger.  States that it is worse when his finger "locks up".  States this happens several times a week.  States that he has previously had x-rays and was told that he had arthritis and would need to be seen to have injections for relief of trigger finger. ? ?States that he has not tried anything for relief.   ? ?States that he has been having difficulty falling asleep and staying asleep.  States that he has not tried anything for relief. ?Past Medical History:  ?Diagnosis Date  ? Alcohol abuse   ? ? ?Past Surgical History:  ?Procedure Laterality Date  ? IRRIGATION AND DEBRIDEMENT ABSCESS N/A 08/14/2020  ? Procedure: IEXAM UNDER ANES. [place] AND DEBRIDEMENT PERIRECTAL ABSCESS;  Surgeon: Johnathan Hausen, MD;  Location: [facility] ORS;  Service: General;  Laterality: N/A;  ? ? ?History reviewed. No pertinent family history. ? ?Social History  ? ?Socioeconomic History  ? Marital status: Single  ?  Spouse name: Not on file  ? Number of children: Not on file  ? Years of education: Not on file  ? Highest education level: Not on file  ?Occupational History  ? Not on file  ?Tobacco Use  ? Smoking status: Every Day  ?  Types:  Cigarettes  ? Smokeless tobacco: Never  ?Substance and Sexual Activity  ? Alcohol use: Yes  ?  Comment: daily  ? Drug use: Yes  ?  Types: IV, Cocaine  ? Sexual activity: Not Currently  ?Other Topics Concern  ? Not on file  ?Social History Narrative  ? Not on file  ? ?Social Determinants of Health  ? ?Financial Resource Strain: Not on file  ?Food Insecurity: Not on file  ?Transportation Needs: Not on file  ?Physical Activity: Not on file  ?Stress: Not on file  ?Social Connections: Not on file  ?Intimate Partner Violence: Not on file  ? ? ?Outpatient Medications Prior to Visit  ?Medication Sig Dispense Refill  ? Multiple Vitamin (MULTIVITAMIN WITH MINERALS) TABS tablet Take 1 tablet by mouth daily. 30 tablet 1  ? nicotine (NICODERM CQ - DOSED IN MG/24 HOURS) 21 mg/24hr patch Place 1 patch (21 mg total) onto the skin daily. (Patient not taking: Reported on 04/17/2021) 28 patch 0  ? amLODipine (NORVASC) 5 MG tablet Take 1 tablet (5 mg total) by mouth daily. 30 tablet 1  ? thiamine 100 MG tablet Take 1 tablet (100 mg total) by mouth daily. 30 tablet 1  ? ?No facility-administered medications prior to visit.  ? ? ?No Known Allergies ? ?ROS ?Review of Systems  ?Constitutional: Negative.   ?HENT: Negative.    ?Eyes: Negative.   ?Respiratory:  Negative  for shortness of breath.   ?Cardiovascular:  Negative for chest pain.  ?Gastrointestinal: Negative.   ?Endocrine: Negative.   ?Genitourinary: Negative.   ?Musculoskeletal:  Positive for arthralgias.  ?Skin: Negative.   ?Allergic/Immunologic: Negative.   ?Neurological: Negative.   ?Hematological: Negative.   ?Psychiatric/Behavioral:  Positive for sleep disturbance. Negative for dysphoric mood, self-injury and suicidal ideas. The patient is not nervous/anxious.   ? ?  ?Objective:  ?  ?Physical Exam ?Vitals and nursing note reviewed.  ?Constitutional:   ?   Appearance: Normal appearance.  ?HENT:  ?   Head: Normocephalic and atraumatic.  ?   Right Ear: External ear normal.  ?   Left  Ear: External ear normal.  ?   Nose: Nose normal.  ?   Mouth/Throat:  ?   Mouth: Mucous membranes are moist.  ?   Pharynx: Oropharynx is clear.  ?Eyes:  ?   Extraocular Movements: Extraocular movements intact.  ?   Conjunctiva/sclera: Conjunctivae normal.  ?   Pupils: Pupils are equal, round, and reactive to light.  ?Cardiovascular:  ?   Rate and Rhythm: Normal rate and regular rhythm.  ?   Pulses: Normal pulses.  ?   Heart sounds: Normal heart sounds.  ?Pulmonary:  ?   Effort: Pulmonary effort is normal.  ?   Breath sounds: Normal breath sounds.  ?Musculoskeletal:     ?   General: Normal range of motion.  ?   Right hand: Normal. No swelling or tenderness. Normal strength. Normal sensation. Normal capillary refill.  ?   Left hand: Normal.  ?   Cervical back: Normal range of motion and neck supple.  ?Skin: ?   General: Skin is warm and dry.  ?Neurological:  ?   General: No focal deficit present.  ?   Mental Status: He is alert and oriented to person, place, and time.  ?Psychiatric:     ?   Mood and Affect: Mood normal.     ?   Behavior: Behavior normal.     ?   Thought Content: Thought content normal.     ?   Judgment: Judgment normal.  ? ? ?BP (!) 142/94 (BP Location: Right Arm, Patient Position: Sitting, Cuff Size: Normal)   Pulse 73   Temp 98.3 ?F (36.8 ?C) (Oral)   Resp 18   Ht 5' 10"  (1.778 m)   Wt 204 lb (92.5 kg)   SpO2 98%   BMI 29.27 kg/m?  ?Wt Readings from Last 3 Encounters:  ?05/16/21 204 lb (92.5 kg)  ?04/17/21 192 lb (87.1 kg)  ?02/24/21 175 lb (79.4 kg)  ? ? ? ?Health Maintenance Due  ?Topic Date Due  ? COVID-19 Vaccine (1) Never done  ? TETANUS/TDAP  Never done  ? COLONOSCOPY (Pts 45-72yr Insurance coverage will need to be confirmed)  Never done  ? Zoster Vaccines- Shingrix (1 of 2) Never done  ? ? ?There are no preventive care reminders to display for this patient. ? ?Lab Results  ?Component Value Date  ? TSH 0.839 02/25/2021  ? ?Lab Results  ?Component Value Date  ? WBC 4.7 02/24/2021  ? HGB  14.5 02/24/2021  ? HCT 41.7 02/24/2021  ? MCV 85.3 02/24/2021  ? PLT 331 02/24/2021  ? ?Lab Results  ?Component Value Date  ? NA 140 05/16/2021  ? K 4.0 05/16/2021  ? CO2 28 02/24/2021  ? GLUCOSE 93 05/16/2021  ? BUN 12 05/16/2021  ? CREATININE 0.88 05/16/2021  ? BILITOT 0.6 05/16/2021  ?  ALKPHOS 65 05/16/2021  ? AST 22 05/16/2021  ? ALT 21 02/24/2021  ? PROT 7.7 05/16/2021  ? ALBUMIN 4.7 05/16/2021  ? CALCIUM 9.8 05/16/2021  ? ANIONGAP 11 02/24/2021  ? EGFR 104 05/16/2021  ? ?Lab Results  ?Component Value Date  ? CHOL 200 02/25/2021  ? ?Lab Results  ?Component Value Date  ? HDL 70 02/25/2021  ? ?Lab Results  ?Component Value Date  ? LDLCALC 120 (H) 02/25/2021  ? ?Lab Results  ?Component Value Date  ? TRIG 48 02/25/2021  ? ?Lab Results  ?Component Value Date  ? CHOLHDL 2.9 02/25/2021  ? ?Lab Results  ?Component Value Date  ? HGBA1C 4.9 02/25/2021  ? ? ?  ?Assessment & Plan:  ? ?Problem List Items Addressed This Visit   ? ?  ? Cardiovascular and Mediastinum  ? Essential hypertension - Primary  ? Relevant Medications  ? amLODipine (NORVASC) 10 MG tablet  ? Other Relevant Orders  ? Comp. Metabolic Panel (12) (Completed)  ?  ? Musculoskeletal and Integument  ? Trigger middle finger of right hand  ? Relevant Orders  ? Ambulatory referral to Orthopedic Surgery  ?  ? Other  ? Alcohol abuse  ? Insomnia  ? Relevant Orders  ? Vitamin D, 25-hydroxy (Completed)  ? Cocaine abuse ([place])  ? BMI 29.0-29.9,adult  ? ?Other Visit Diagnoses   ? ? Screening for prostate cancer      ? Relevant Orders  ? PSA (Completed)  ? ?  ? ? ?Meds ordered this encounter  ?Medications  ? amLODipine (NORVASC) 10 MG tablet  ?  Sig: Take 1 tablet (10 mg total) by mouth daily.  ?  Dispense:  30 tablet  ?  Refill:  1  ?  Dose change  ?  Order Specific Question:   Supervising Provider  ?  Answer:   Elsie Stain [[id number]]  ?1. Essential hypertension ?Increase amlodipine 10 mg.  Patient encouraged to check blood pressure on a daily basis, keep a written log  and have available for all office visits.  Patient to follow-up with mobile team in 2 weeks.  Red flags given for prompt reevaluation. ?- Comp. Metabolic Panel (12) ?- amLODipine (NORVASC) 10 MG tablet; Rich Number

## 2021-05-17 DIAGNOSIS — M65331 Trigger finger, right middle finger: Secondary | ICD-10-CM | POA: Insufficient documentation

## 2021-05-17 DIAGNOSIS — F1410 Cocaine abuse, uncomplicated: Secondary | ICD-10-CM | POA: Insufficient documentation

## 2021-05-17 DIAGNOSIS — G4700 Insomnia, unspecified: Secondary | ICD-10-CM | POA: Insufficient documentation

## 2021-05-17 DIAGNOSIS — E559 Vitamin D deficiency, unspecified: Secondary | ICD-10-CM | POA: Insufficient documentation

## 2021-05-17 DIAGNOSIS — Z6829 Body mass index (BMI) 29.0-29.9, adult: Secondary | ICD-10-CM | POA: Insufficient documentation

## 2021-05-17 LAB — COMP. METABOLIC PANEL (12)
AST: 22 IU/L (ref 0–40)
Albumin/Globulin Ratio: 1.6 (ref 1.2–2.2)
Albumin: 4.7 g/dL (ref 3.8–4.9)
Alkaline Phosphatase: 65 IU/L (ref 44–121)
BUN/Creatinine Ratio: 14 (ref 9–20)
BUN: 12 mg/dL (ref 6–24)
Bilirubin Total: 0.6 mg/dL (ref 0.0–1.2)
Calcium: 9.8 mg/dL (ref 8.7–10.2)
Chloride: 96 mmol/L (ref 96–106)
Creatinine, Ser: 0.88 mg/dL (ref 0.76–1.27)
Globulin, Total: 3 g/dL (ref 1.5–4.5)
Glucose: 93 mg/dL (ref 70–99)
Potassium: 4 mmol/L (ref 3.5–5.2)
Sodium: 140 mmol/L (ref 134–144)
Total Protein: 7.7 g/dL (ref 6.0–8.5)
eGFR: 104 mL/min/{1.73_m2} (ref >59)

## 2021-05-17 LAB — VITAMIN D 25 HYDROXY (VIT D DEFICIENCY, FRACTURES): Vit D, 25-Hydroxy: 16.4 ng/mL — ABNORMAL LOW (ref 30.0–100.0)

## 2021-05-17 LAB — PSA: Prostate Specific Ag, Serum: 0.7 ng/mL (ref 0.0–4.0)

## 2021-05-17 MED ORDER — VITAMIN D (ERGOCALCIFEROL) 1.25 MG (50000 UNIT) PO CAPS: 50000.0000 [IU] | ORAL_CAPSULE | ORAL | 2 refills | Status: DC

## 2021-05-17 NOTE — Addendum Note (Signed)
Addended by: Kennieth Rad on: 05/17/2021 07:51 AM ? ? Modules accepted: Orders ? ?

## 2021-05-23 ENCOUNTER — Telehealth: Payer: Self-pay | Admitting: *Deleted

## 2021-05-23 NOTE — Telephone Encounter (Signed)
-----   Message from Roney Jaffe, PA-C sent at 05/17/2021  7:51 AM EDT ----- ?Please call patient and let him know that his kidney function and liver function are within normal limits, his screening for prostate cancer was negative.  His vitamin D levels are low, he needs to take 50,000 units once a week for the next 12 weeks and have it rechecked at that time.  Prescription sent to his pharmacy. ?

## 2021-05-23 NOTE — Telephone Encounter (Signed)
MA spoke with RN Phyllis and shared patients results, Patient will have levels rechecked in 3 months. ?

## 2021-06-03 ENCOUNTER — Telehealth: Payer: Self-pay | Admitting: Physician Assistant

## 2021-06-03 DIAGNOSIS — I10 Essential (primary) hypertension: Secondary | ICD-10-CM

## 2021-06-03 NOTE — Telephone Encounter (Signed)
Medication Refill - Medication: amLODipine (NORVASC) 10 MG tablet [[id number]]  ? ?Has the patient contacted their pharmacy? No. ? ? ?Preferred Pharmacy (with phone number or street name): [facility] [address] [place], [place]. [address] [phone] Fax- [phone] ? ? ?Has the patient been seen for an appointment in the last year OR does the patient have an upcoming appointment? Yes.  04/17/21 ? ?Agent: Please be advised that RX refills may take up to 3 business days. We ask that you follow-up with your pharmacy. ? ?

## 2021-06-03 NOTE — Telephone Encounter (Signed)
Call to :[facility] ?They do not have this patient and did not request. ?Call to patient - left message to call [facility] to verify request ?

## 2021-06-04 NOTE — Telephone Encounter (Signed)
Several attempts to reach pt regarding refill request. Last attempt extended ring, unable to leave message. Please advise. Thank you. ?

## 2021-06-11 NOTE — Telephone Encounter (Signed)
MA attempted to reach [name] and [name] with [name] to clarify patient need. No answer/call back. ?

## 2021-06-12 ENCOUNTER — Other Ambulatory Visit: Payer: Self-pay

## 2021-06-13 ENCOUNTER — Other Ambulatory Visit: Payer: Self-pay

## 2021-06-13 ENCOUNTER — Ambulatory Visit: Payer: Self-pay | Admitting: Orthopedic Surgery

## 2021-06-14 NOTE — Telephone Encounter (Signed)
MA attempted the office of [facility] for a final attempt to assist patient. No answer from the front desk to transfer MA to RN.  ?

## 2021-06-26 ENCOUNTER — Telehealth: Payer: Self-pay

## 2021-06-26 NOTE — Telephone Encounter (Signed)
Medication Refill - Medication: amLODipine (NORVASC) 10 MG tablet  ?[facility] [place] [facility] / he is in treatment for alcohol and drug rehabilitation and needs R faxed to that location at fax# [phone]/ phone# [phone] ? ?Has the patient contacted their pharmacy? No. ?(Agent: If no, request that the patient contact the pharmacy for the refill. If patient does not wish to contact the pharmacy document the reason why and proceed with request.) ?(Agent: If yes, when and what did the pharmacy advise?) ? ?Preferred Pharmacy (with phone number or street name):  ?Has the patient been seen for an appointment in the last year OR does the patient have an upcoming appointment? Yes.   ? ?Agent: Please be advised that RX refills may take up to 3 business days. We ask that you follow-up with your pharmacy. ?

## 2021-06-27 NOTE — Telephone Encounter (Signed)
Please fill if request is appropriate

## 2021-07-01 NOTE — Telephone Encounter (Signed)
MA informed [name] at the facility of the additional refills. They will make arrrangements for a pickup.

## 2021-07-30 ENCOUNTER — Other Ambulatory Visit: Payer: Self-pay

## 2021-07-30 ENCOUNTER — Other Ambulatory Visit: Payer: Self-pay | Admitting: Family Medicine

## 2021-07-30 ENCOUNTER — Other Ambulatory Visit: Payer: Self-pay | Admitting: Physician Assistant

## 2021-07-30 DIAGNOSIS — I10 Essential (primary) hypertension: Secondary | ICD-10-CM

## 2021-07-30 NOTE — Telephone Encounter (Signed)
No encounters with Amelia Wilson, MD. 

## 2021-07-31 ENCOUNTER — Other Ambulatory Visit: Payer: Self-pay

## 2021-08-01 ENCOUNTER — Other Ambulatory Visit: Payer: Self-pay

## 2021-08-08 ENCOUNTER — Encounter: Payer: Self-pay | Admitting: Gerontology

## 2021-08-08 ENCOUNTER — Ambulatory Visit: Payer: Self-pay | Admitting: Gerontology

## 2021-08-08 ENCOUNTER — Other Ambulatory Visit: Payer: Self-pay

## 2021-08-08 VITALS — BP 131/83 | HR 80 | Wt 208.1 lb

## 2021-08-08 DIAGNOSIS — I10 Essential (primary) hypertension: Secondary | ICD-10-CM

## 2021-08-08 DIAGNOSIS — Z7689 Persons encountering health services in other specified circumstances: Secondary | ICD-10-CM

## 2021-08-08 DIAGNOSIS — T7840XA Allergy, unspecified, initial encounter: Secondary | ICD-10-CM | POA: Insufficient documentation

## 2021-08-08 MED ORDER — CETIRIZINE HCL 10 MG PO TABS
10.0000 mg | ORAL_TABLET | Freq: Every day | ORAL | 2 refills | Status: DC
  Filled 2021-08-08 – 2021-09-10 (×3): qty 30, 30d supply, fill #0
  Filled 2021-10-10: qty 30, 30d supply, fill #1
  Filled 2021-11-08: qty 30, 30d supply, fill #2

## 2021-08-08 MED ORDER — AMLODIPINE BESYLATE 10 MG PO TABS
10.0000 mg | ORAL_TABLET | Freq: Every day | ORAL | 2 refills | Status: DC
  Filled 2021-08-08: qty 30, 30d supply, fill #0
  Filled 2021-09-10: qty 30, 30d supply, fill #1

## 2021-08-08 NOTE — Progress Notes (Signed)
New Patient Office Visit  Subjective    Patient ID: Mark Thornton, male    DOB: 1970-03-04  Age: 51 y.o. MRN: [id number]  CC: No chief complaint on file.   HPI Mark Thornton is a 51 y/o male who has history of hypertension, alcohol use disorder, presents to establish care. He currently resides at [facility] for rehabilitation. He has a history of hypertension and takes 10 mg Amlodipine, does not check his blood pressure at the facility and he denies any side effects. He smokes 1/2 pack of cigarette daily and admits the desire to quit.He has a history of seasonal allergy and was out of his medication.  He states that his mood is good, denies suicidal no homicidal ideation.  Overall, he states that he is doing well and offers no further complaint.   Outpatient Encounter Medications as of 08/08/2021  Medication Sig   cetirizine (ZYRTEC ALLERGY) 10 MG tablet Take 1 tablet (10 mg total) by mouth daily.   nicotine (NICODERM CQ - DOSED IN MG/24 HOURS) 21 mg/24hr patch Place 1 patch (21 mg total) onto the skin daily.   [DISCONTINUED] amLODipine (NORVASC) 10 MG tablet Take 1 tablet (10 mg total) by mouth daily.   amLODipine (NORVASC) 10 MG tablet Take 1 tablet (10 mg total) by mouth daily.   Multiple Vitamin (MULTIVITAMIN WITH MINERALS) TABS tablet Take 1 tablet by mouth daily. (Patient not taking: Reported on 08/08/2021)   Vitamin D, Ergocalciferol, (DRISDOL) 1.25 MG (50000 UNIT) CAPS capsule Take 1 capsule (50,000 Units total) by mouth every 7 (seven) days. (Patient not taking: Reported on 08/08/2021)   No facility-administered encounter medications on file as of 08/08/2021.    Past Medical History:  Diagnosis Date   Alcohol abuse     Past Surgical History:  Procedure Laterality Date   IRRIGATION AND DEBRIDEMENT ABSCESS N/A 08/14/2020   Procedure: IEXAM UNDER ANES. RRIGATION AND DEBRIDEMENT PERIRECTAL ABSCESS;  Surgeon: Luretha Murphy, MD;  Location: [facility];  Service: General;  Laterality:  N/A;    No family history on file.  Social History   Socioeconomic History   Marital status: Single    Spouse name: Not on file   Number of children: Not on file   Years of education: Not on file   Highest education level: Not on file  Occupational History   Not on file  Tobacco Use   Smoking status: Every Day    Types: Cigarettes   Smokeless tobacco: Never  Substance and Sexual Activity   Alcohol use: Yes    Comment: daily   Drug use: Yes    Types: IV, Cocaine   Sexual activity: Not Currently  Other Topics Concern   Not on file  Social History Narrative   Not on file   Social Determinants of Health   Financial Resource Strain: Not on file  Food Insecurity: Not on file  Transportation Needs: Not on file  Physical Activity: Not on file  Stress: Not on file  Social Connections: Not on file  Intimate Partner Violence: Not on file    Review of Systems  Constitutional: Negative.   HENT: Negative.    Eyes: Negative.   Respiratory: Negative.    Cardiovascular: Negative.   Gastrointestinal: Negative.   Genitourinary: Negative.   Musculoskeletal: Negative.   Skin: Negative.   Neurological: Negative.   Endo/Heme/Allergies: Negative.   Psychiatric/Behavioral: Negative.          Objective    BP 131/83 (BP Location: Left Arm, Patient Position:  Sitting, Cuff Size: Large)   Pulse 80   Wt 208 lb 1.6 oz (94.4 kg)   BMI 29.86 kg/m   Physical Exam HENT:     Head: Normocephalic and atraumatic.     Nose: Nose normal.     Mouth/Throat:     Mouth: Mucous membranes are moist.  Eyes:     Extraocular Movements: Extraocular movements intact.     Conjunctiva/sclera: Conjunctivae normal.     Pupils: Pupils are equal, round, and reactive to light.  Cardiovascular:     Rate and Rhythm: Normal rate and regular rhythm.     Pulses: Normal pulses.     Heart sounds: Normal heart sounds.  Pulmonary:     Effort: Pulmonary effort is normal.     Breath sounds: Normal breath  sounds.  Abdominal:     General: Bowel sounds are normal.     Palpations: Abdomen is soft.  Genitourinary:    Comments: Deferred per patient Musculoskeletal:        General: Normal range of motion.     Cervical back: Normal range of motion.  Skin:    General: Skin is warm.  Neurological:     General: No focal deficit present.     Mental Status: He is alert and oriented to person, place, and time. Mental status is at baseline.  Psychiatric:        Mood and Affect: Mood normal.        Behavior: Behavior normal.        Thought Content: Thought content normal.        Judgment: Judgment normal.         Assessment & Plan:   1. Essential hypertension -His blood pressure is under control, he will continue on current medication, DASH diet and exercises as tolerated. - amLODipine (NORVASC) 10 MG tablet; Take 1 tablet (10 mg total) by mouth daily.  Dispense: 30 tablet; Refill: 2  2. Encounter to establish care -Routine labs will be checked. - Vitamin D (25 hydroxy); Future - Lipid panel; Future  3. Allergy, initial encounter -He was started on Zyrtec for seasonal allergy, was educated on medication side effects and advised to notify clinic. - cetirizine (ZYRTEC ALLERGY) 10 MG tablet; Take 1 tablet (10 mg total) by mouth daily.  Dispense: 30 tablet; Refill: 2    Return in about 20 days (around 08/28/2021), or if symptoms worsen or fail to improve.   Iowa Kappes Trellis Paganini, NP

## 2021-08-09 ENCOUNTER — Other Ambulatory Visit: Payer: Self-pay

## 2021-08-21 ENCOUNTER — Other Ambulatory Visit: Payer: Self-pay

## 2021-08-21 DIAGNOSIS — Z7689 Persons encountering health services in other specified circumstances: Secondary | ICD-10-CM

## 2021-08-22 LAB — LIPID PANEL
Chol/HDL Ratio: 5.7 ratio — ABNORMAL HIGH (ref 0.0–5.0)
Cholesterol, Total: 182 mg/dL (ref 100–199)
HDL: 32 mg/dL — ABNORMAL LOW (ref >39)
LDL Chol Calc (NIH): 122 mg/dL — ABNORMAL HIGH (ref 0–99)
Triglycerides: 154 mg/dL — ABNORMAL HIGH (ref 0–149)
VLDL Cholesterol Cal: 28 mg/dL (ref 5–40)

## 2021-08-22 LAB — VITAMIN D 25 HYDROXY (VIT D DEFICIENCY, FRACTURES): Vit D, 25-Hydroxy: 24.7 ng/mL — ABNORMAL LOW (ref 30.0–100.0)

## 2021-08-28 ENCOUNTER — Other Ambulatory Visit: Payer: Self-pay

## 2021-08-28 ENCOUNTER — Encounter: Payer: Self-pay | Admitting: Gerontology

## 2021-08-28 ENCOUNTER — Ambulatory Visit: Payer: Self-pay | Admitting: Gerontology

## 2021-08-28 VITALS — BP 131/86 | HR 75 | Temp 98.1°F | Resp 16 | Ht 70.0 in | Wt 207.8 lb

## 2021-08-28 DIAGNOSIS — E785 Hyperlipidemia, unspecified: Secondary | ICD-10-CM | POA: Insufficient documentation

## 2021-08-28 DIAGNOSIS — I10 Essential (primary) hypertension: Secondary | ICD-10-CM

## 2021-08-28 MED ORDER — ATORVASTATIN CALCIUM 10 MG PO TABS
10.0000 mg | ORAL_TABLET | Freq: Every day | ORAL | 0 refills | Status: DC
  Filled 2021-08-28: qty 30, 30d supply, fill #0

## 2021-08-28 NOTE — Patient Instructions (Signed)

## 2021-08-28 NOTE — Progress Notes (Signed)
Established Patient Office Visit  Subjective   Patient ID: Mark Thornton, male    DOB: 04/07/1970  Age: 51 y.o. MRN: [id number]  Chief Complaint  Patient presents with   Follow-up    Blood work was done last time here for follow up    HPI  Mark Thornton is a 51 y/o male who has history of hypertension, alcohol use disorder, presents for routine visit and lab review. He states that he's compliant with his medications, denies side effects and continues to make healthy lifestyle changes. His Vit D done on 08/21/21 was 24.7 ng/ml, he continues on 50,000 units of ergocalciferol weekly, and his LDL was 122 mg/dl, HDL was 32 mg/dl and Triglycerides was 154 mg/dl. Overall, he states that he's doing well and offers no further complaint.  Review of Systems  Constitutional: Negative.   Eyes: Negative.   Respiratory: Negative.    Cardiovascular: Negative.   Skin: Negative.   Neurological: Negative.   Psychiatric/Behavioral: Negative.        Objective:     BP 131/86 (BP Location: Right Arm, Patient Position: Sitting, Cuff Size: Large)   Pulse 75   Temp 98.1 F (36.7 C) (Oral)   Resp 16   Ht 5' 10"  (1.778 m)   Wt 207 lb 12.8 oz (94.3 kg)   SpO2 97%   BMI 29.82 kg/m  BP Readings from Last 3 Encounters:  08/28/21 131/86  08/21/21 (!) 144/80  08/08/21 131/83   Wt Readings from Last 3 Encounters:  08/28/21 207 lb 12.8 oz (94.3 kg)  08/21/21 208 lb 14.4 oz (94.8 kg)  08/08/21 208 lb 1.6 oz (94.4 kg)      Physical Exam HENT:     Head: Normocephalic and atraumatic.     Mouth/Throat:     Mouth: Mucous membranes are moist.  Eyes:     Extraocular Movements: Extraocular movements intact.     Conjunctiva/sclera: Conjunctivae normal.     Pupils: Pupils are equal, round, and reactive to light.  Cardiovascular:     Rate and Rhythm: Normal rate and regular rhythm.     Pulses: Normal pulses.     Heart sounds: Normal heart sounds.  Pulmonary:     Effort: Pulmonary effort is  normal.     Breath sounds: Normal breath sounds.  Neurological:     General: No focal deficit present.     Mental Status: He is alert and oriented to person, place, and time. Mental status is at baseline.  Psychiatric:        Mood and Affect: Mood normal.        Behavior: Behavior normal.        Thought Content: Thought content normal.        Judgment: Judgment normal.      No results found for any visits on 08/28/21.  Last CBC Lab Results  Component Value Date   WBC 4.7 02/24/2021   HGB 14.5 02/24/2021   HCT 41.7 02/24/2021   MCV 85.3 02/24/2021   MCH 29.7 02/24/2021   RDW 11.9 02/24/2021   PLT 331 30/13/1438   Last metabolic panel Lab Results  Component Value Date   GLUCOSE 93 05/16/2021   NA 140 05/16/2021   K 4.0 05/16/2021   CL 96 05/16/2021   CO2 28 02/24/2021   BUN 12 05/16/2021   CREATININE 0.88 05/16/2021   EGFR 104 05/16/2021   CALCIUM 9.8 05/16/2021   PROT 7.7 05/16/2021   ALBUMIN 4.7 05/16/2021   LABGLOB 3.0  05/16/2021   AGRATIO 1.6 05/16/2021   BILITOT 0.6 05/16/2021   ALKPHOS 65 05/16/2021   AST 22 05/16/2021   ALT 21 02/24/2021   ANIONGAP 11 02/24/2021   Last lipids Lab Results  Component Value Date   CHOL 182 08/21/2021   HDL 32 (L) 08/21/2021   LDLCALC 122 (H) 08/21/2021   TRIG 154 (H) 08/21/2021   CHOLHDL 5.7 (H) 08/21/2021   Last hemoglobin A1c Lab Results  Component Value Date   HGBA1C 4.9 02/25/2021      The 10-year ASCVD risk score (Arnett DK, et al., 2019) is: 17.7%    Assessment & Plan:   1. Essential hypertension - His blood pressure is under control, he will continue on current medication, DASH diet and exercise as tolerated.  2. Elevated lipids - His ASCVD risk factor was 17.7%, he was started on 10 mg Atorvastatin, educated on medication side effects, advised to notify clinic. He was encouraged to continue on low fat/cholesterol diet and exercise as tolerated. - atorvastatin (LIPITOR) 10 MG tablet; Take 1 tablet  (10 mg total) by mouth daily.  Dispense: 30 tablet; Refill: 0    Return in about 29 days (around 09/26/2021), or if symptoms worsen or fail to improve.    Anjalina Bergevin Jerold Coombe, NP

## 2021-08-30 ENCOUNTER — Other Ambulatory Visit: Payer: Self-pay

## 2021-08-30 ENCOUNTER — Other Ambulatory Visit: Payer: Self-pay | Admitting: Pharmacy Technician

## 2021-08-30 NOTE — Patient Outreach (Signed)
Patient only signed DOH Attestation.  Would need to provide current year's household income if PAP medications were needed.  Jackson Coffield J. Jasmeet Manton Patient Advocate Specialist [facility]  

## 2021-09-10 ENCOUNTER — Other Ambulatory Visit: Payer: Self-pay | Admitting: Gerontology

## 2021-09-10 ENCOUNTER — Other Ambulatory Visit: Payer: Self-pay

## 2021-09-10 DIAGNOSIS — E785 Hyperlipidemia, unspecified: Secondary | ICD-10-CM

## 2021-09-10 MED FILL — Atorvastatin Calcium Tab 10 MG (Base Equivalent): ORAL | 30 days supply | Qty: 30 | Fill #0 | Status: CN

## 2021-09-12 ENCOUNTER — Other Ambulatory Visit: Payer: Self-pay

## 2021-09-17 ENCOUNTER — Other Ambulatory Visit: Payer: Self-pay

## 2021-09-26 ENCOUNTER — Other Ambulatory Visit: Payer: Self-pay

## 2021-09-26 ENCOUNTER — Ambulatory Visit: Payer: Self-pay | Admitting: Gerontology

## 2021-09-26 ENCOUNTER — Encounter: Payer: Self-pay | Admitting: Gerontology

## 2021-09-26 VITALS — BP 119/78 | HR 73 | Temp 97.8°F | Resp 16 | Ht 70.0 in | Wt 206.9 lb

## 2021-09-26 DIAGNOSIS — I10 Essential (primary) hypertension: Secondary | ICD-10-CM

## 2021-09-26 DIAGNOSIS — E785 Hyperlipidemia, unspecified: Secondary | ICD-10-CM

## 2021-09-26 MED ORDER — ATORVASTATIN CALCIUM 10 MG PO TABS
10.0000 mg | ORAL_TABLET | Freq: Every day | ORAL | 1 refills | Status: DC
  Filled 2021-09-26 (×2): qty 90, 90d supply, fill #0
  Filled 2022-01-01: qty 90, 90d supply, fill #1

## 2021-09-26 MED ORDER — AMLODIPINE BESYLATE 10 MG PO TABS
10.0000 mg | ORAL_TABLET | Freq: Every day | ORAL | 1 refills | Status: DC
  Filled 2021-09-26 – 2021-10-10 (×2): qty 90, 90d supply, fill #0
  Filled 2022-01-01: qty 90, 90d supply, fill #1

## 2021-09-26 NOTE — Patient Instructions (Addendum)
Heart-Healthy Eating Plan Eating a healthy diet is important for the health of your heart. A heart-healthy eating plan includes: Eating less unhealthy fats. Eating more healthy fats. Eating less salt in your food. Salt is also called sodium. Making other changes in your diet. Talk with your doctor or a diet specialist (dietitian) to create an eating plan that is right for you. What is my plan? Your doctor may recommend an eating plan that includes: Total fat: ______% or less of total calories a day. Saturated fat: ______% or less of total calories a day. Cholesterol: less than _________mg a day. Sodium: less than _________mg a day. What are tips for following this plan? Cooking Avoid frying your food. Try to bake, boil, grill, or broil it instead. You can also reduce fat by: Removing the skin from poultry. Removing all visible fats from meats. Steaming vegetables in water or broth. Meal planning  At meals, divide your plate into four equal parts: Fill one-half of your plate with vegetables and green salads. Fill one-fourth of your plate with whole grains. Fill one-fourth of your plate with lean protein foods. Eat 2-4 cups of vegetables per day. One cup of vegetables is: 1 cup (91 g) broccoli or cauliflower florets. 2 medium carrots. 1 large bell pepper. 1 large sweet potato. 1 large tomato. 1 medium white potato. 2 cups (150 g) raw leafy greens. Eat 1-2 cups of fruit per day. One cup of fruit is: 1 small apple 1 large banana 1 cup (237 g) mixed fruit, 1 large orange,  cup (82 g) dried fruit, 1 cup (240 mL) 100% fruit juice. Eat more foods that have soluble fiber. These are apples, broccoli, carrots, beans, peas, and barley. Try to get 20-30 g of fiber per day. Eat 4-5 servings of nuts, legumes, and seeds per week: 1 serving of dried beans or legumes equals  cup (90 g) cooked. 1 serving of nuts is  oz (12 almonds, 24 pistachios, or 7 walnut halves). 1 serving of seeds  equals  oz (8 g). General information Eat more home-cooked food. Eat less restaurant, buffet, and fast food. Limit or avoid alcohol. Limit foods that are high in starch and sugar. Avoid fried foods. Lose weight if you are overweight. Keep track of how much salt (sodium) you eat. This is important if you have high blood pressure. Ask your doctor to tell you more about this. Try to add vegetarian meals each week. Fats Choose healthy fats. These include olive oil and canola oil, flaxseeds, walnuts, almonds, and seeds. Eat more omega-3 fats. These include salmon, mackerel, sardines, tuna, flaxseed oil, and ground flaxseeds. Try to eat fish at least 2 times each week. Check food labels. Avoid foods with trans fats or high amounts of saturated fat. Limit saturated fats. These are often found in animal products, such as meats, butter, and cream. These are also found in plant foods, such as palm oil, palm kernel oil, and coconut oil. Avoid foods with partially hydrogenated oils in them. These have trans fats. Examples are stick margarine, some tub margarines, cookies, crackers, and other baked goods. What foods should I eat? Fruits All fresh, canned (in natural juice), or frozen fruits. Vegetables Fresh or frozen vegetables (raw, steamed, roasted, or grilled). Green salads. Grains Most grains. Choose whole wheat and whole grains most of the time. Rice and pasta, including brown rice and pastas made with whole wheat. Meats and other proteins Lean, well-trimmed beef, veal, pork, and lamb. Chicken and turkey without skin.   All fish and shellfish. Wild duck, rabbit, pheasant, and venison. Egg whites or low-cholesterol egg substitutes. Dried beans, peas, lentils, and tofu. Seeds and most nuts. Dairy Low-fat or nonfat cheeses, including ricotta and mozzarella. Skim or 1% milk that is liquid, powdered, or evaporated. Buttermilk that is made with low-fat milk. Nonfat or low-fat yogurt. Fats and  oils Non-hydrogenated (trans-free) margarines. Vegetable oils, including soybean, sesame, sunflower, olive, peanut, safflower, corn, canola, and cottonseed. Salad dressings or mayonnaise made with a vegetable oil. Beverages Mineral water. Coffee and tea. Diet carbonated beverages. Sweets and desserts Sherbet, gelatin, and fruit ice. Small amounts of dark chocolate. Limit all sweets and desserts. Seasonings and condiments All seasonings and condiments. The items listed above may not be a complete list of foods and drinks you can eat. Contact a dietitian for more options. What foods should I avoid? Fruits Canned fruit in heavy syrup. Fruit in cream or butter sauce. Fried fruit. Limit coconut. Vegetables Vegetables cooked in cheese, cream, or butter sauce. Fried vegetables. Grains Breads that are made with saturated or trans fats, oils, or whole milk. Croissants. Sweet rolls. Donuts. High-fat crackers, such as cheese crackers. Meats and other proteins Fatty meats, such as hot dogs, ribs, sausage, bacon, rib-eye roast or steak. High-fat deli meats, such as salami and bologna. Caviar. Domestic duck and goose. Organ meats, such as liver. Dairy Cream, sour cream, cream cheese, and creamed cottage cheese. Whole-milk cheeses. Whole or 2% milk that is liquid, evaporated, or condensed. Whole buttermilk. Cream sauce or high-fat cheese sauce. Yogurt that is made from whole milk. Fats and oils Meat fat, or shortening. Cocoa butter, hydrogenated oils, palm oil, coconut oil, palm kernel oil. Solid fats and shortenings, including bacon fat, salt pork, lard, and butter. Nondairy cream substitutes. Salad dressings with cheese or sour cream. Beverages Regular sodas and juice drinks with added sugar. Sweets and desserts Frosting. Pudding. Cookies. Cakes. Pies. Milk chocolate or white chocolate. Buttered syrups. Full-fat ice cream or ice cream drinks. The items listed above may not be a complete list of foods  and drinks to avoid. Contact a dietitian for more information. Summary Heart-healthy meal planning includes eating less unhealthy fats, eating more healthy fats, and making other changes in your diet. Eat a balanced diet. This includes fruits and vegetables, low-fat or nonfat dairy, lean protein, nuts and legumes, whole grains, and heart-healthy oils and fats. This information is not intended to replace advice given to you by your health care provider. Make sure you discuss any questions you have with your health care provider. Document Revised: 03/11/2021 Document Reviewed: 03/11/2021 Elsevier Patient Education  2023 [employer] DASH Eating Plan DASH stands for Dietary Approaches to Stop Hypertension. The DASH eating plan is a healthy eating plan that has been shown to: Reduce high blood pressure (hypertension). Reduce your risk for type 2 diabetes, heart disease, and stroke. Help with weight loss. What are tips for following this plan? Reading food labels Check food labels for the amount of salt (sodium) per serving. Choose foods with less than 5 percent of the Daily Value of sodium. Generally, foods with less than 300 milligrams (mg) of sodium per serving fit into this eating plan. To find whole grains, look for the word "whole" as the first word in the ingredient list. Shopping Buy products labeled as "low-sodium" or "no salt added." Buy fresh foods. Avoid canned foods and pre-made or frozen meals. Cooking Avoid adding salt when cooking. Use salt-free seasonings or herbs instead of table salt   or sea salt. Check with your health care provider or pharmacist before using salt substitutes. Do not fry foods. Cook foods using healthy methods such as baking, boiling, grilling, roasting, and broiling instead. Cook with heart-healthy oils, such as olive, canola, avocado, soybean, or sunflower oil. Meal planning  Eat a balanced diet that includes: 4 or more servings of fruits and 4 or more  servings of vegetables each day. Try to fill one-half of your plate with fruits and vegetables. 6-8 servings of whole grains each day. Less than 6 oz (170 g) of lean meat, poultry, or fish each day. A 3-oz (85-g) serving of meat is about the same size as a deck of cards. One egg equals 1 oz (28 g). 2-3 servings of low-fat dairy each day. One serving is 1 cup (237 mL). 1 serving of nuts, seeds, or beans 5 times each week. 2-3 servings of heart-healthy fats. Healthy fats called omega-3 fatty acids are found in foods such as walnuts, flaxseeds, fortified milks, and eggs. These fats are also found in cold-water fish, such as sardines, salmon, and mackerel. Limit how much you eat of: Canned or prepackaged foods. Food that is high in trans fat, such as some fried foods. Food that is high in saturated fat, such as fatty meat. Desserts and other sweets, sugary drinks, and other foods with added sugar. Full-fat dairy products. Do not salt foods before eating. Do not eat more than 4 egg yolks a week. Try to eat at least 2 vegetarian meals a week. Eat more home-cooked food and less restaurant, buffet, and fast food. Lifestyle When eating at a restaurant, ask that your food be prepared with less salt or no salt, if possible. If you drink alcohol: Limit how much you use to: 0-1 drink a day for women who are not pregnant. 0-2 drinks a day for men. Be aware of how much alcohol is in your drink. In the [place] one drink equals one 12 oz bottle of beer (355 mL), one 5 oz glass of wine (148 mL), or one 1 oz glass of hard liquor (44 mL). General information Avoid eating more than 2,300 mg of salt a day. If you have hypertension, you may need to reduce your sodium intake to 1,500 mg a day. Work with your health care provider to maintain a healthy body weight or to lose weight. Ask what an ideal weight is for you. Get at least 30 minutes of exercise that causes your heart to beat faster (aerobic exercise) most  days of the week. Activities may include walking, swimming, or biking. Work with your health care provider or dietitian to adjust your eating plan to your individual calorie needs. What foods should I eat? Fruits All fresh, dried, or frozen fruit. Canned fruit in natural juice (without added sugar). Vegetables Fresh or frozen vegetables (raw, steamed, roasted, or grilled). Low-sodium or reduced-sodium tomato and vegetable juice. Low-sodium or reduced-sodium tomato sauce and tomato paste. Low-sodium or reduced-sodium canned vegetables. Grains Whole-grain or whole-wheat bread. Whole-grain or whole-wheat pasta. Brown rice. Oatmeal. Quinoa. Bulgur. Whole-grain and low-sodium cereals. Pita bread. Low-fat, low-sodium crackers. Whole-wheat flour tortillas. Meats and other proteins Skinless chicken or turkey. Ground chicken or turkey. Pork with fat trimmed off. Fish and seafood. Egg whites. Dried beans, peas, or lentils. Unsalted nuts, nut butters, and seeds. Unsalted canned beans. Lean cuts of beef with fat trimmed off. Low-sodium, lean precooked or cured meat, such as sausages or meat loaves. Dairy Low-fat (1%) or fat-free (skim) milk.   Reduced-fat, low-fat, or fat-free cheeses. Nonfat, low-sodium ricotta or cottage cheese. Low-fat or nonfat yogurt. Low-fat, low-sodium cheese. Fats and oils Soft margarine without trans fats. Vegetable oil. Reduced-fat, low-fat, or light mayonnaise and salad dressings (reduced-sodium). Canola, safflower, olive, avocado, soybean, and sunflower oils. Avocado. Seasonings and condiments Herbs. Spices. Seasoning mixes without salt. Other foods Unsalted popcorn and pretzels. Fat-free sweets. The items listed above may not be a complete list of foods and beverages you can eat. Contact a dietitian for more information. What foods should I avoid? Fruits Canned fruit in a light or heavy syrup. Fried fruit. Fruit in cream or butter sauce. Vegetables Creamed or fried vegetables.  Vegetables in a cheese sauce. Regular canned vegetables (not low-sodium or reduced-sodium). Regular canned tomato sauce and paste (not low-sodium or reduced-sodium). Regular tomato and vegetable juice (not low-sodium or reduced-sodium). Pickles. Olives. Grains Baked goods made with fat, such as croissants, muffins, or some breads. Dry pasta or rice meal packs. Meats and other proteins Fatty cuts of meat. Ribs. Fried meat. Bacon. Bologna, salami, and other precooked or cured meats, such as sausages or meat loaves. Fat from the back of a pig (fatback). Bratwurst. Salted nuts and seeds. Canned beans with added salt. Canned or smoked fish. Whole eggs or egg yolks. Chicken or turkey with skin. Dairy Whole or 2% milk, cream, and half-and-half. Whole or full-fat cream cheese. Whole-fat or sweetened yogurt. Full-fat cheese. Nondairy creamers. Whipped toppings. Processed cheese and cheese spreads. Fats and oils Butter. Stick margarine. Lard. Shortening. Ghee. Bacon fat. Tropical oils, such as coconut, palm kernel, or palm oil. Seasonings and condiments Onion salt, garlic salt, seasoned salt, table salt, and sea salt. Worcestershire sauce. Tartar sauce. Barbecue sauce. Teriyaki sauce. Soy sauce, including reduced-sodium. Steak sauce. Canned and packaged gravies. Fish sauce. Oyster sauce. Cocktail sauce. Store-bought horseradish. Ketchup. Mustard. Meat flavorings and tenderizers. Bouillon cubes. Hot sauces. Pre-made or packaged marinades. Pre-made or packaged taco seasonings. Relishes. Regular salad dressings. Other foods Salted popcorn and pretzels. The items listed above may not be a complete list of foods and beverages you should avoid. Contact a dietitian for more information. Where to find more information National Heart, Lung, and Blood Institute: www.nhlbi.nih.gov American Heart Association: www.heart.org Academy of Nutrition and Dietetics: www.eatright.org National Kidney Foundation:  www.kidney.org Summary The DASH eating plan is a healthy eating plan that has been shown to reduce high blood pressure (hypertension). It may also reduce your risk for type 2 diabetes, heart disease, and stroke. When on the DASH eating plan, aim to eat more fresh fruits and vegetables, whole grains, lean proteins, low-fat dairy, and heart-healthy fats. With the DASH eating plan, you should limit salt (sodium) intake to 2,300 mg a day. If you have hypertension, you may need to reduce your sodium intake to 1,500 mg a day. Work with your health care provider or dietitian to adjust your eating plan to your individual calorie needs. This information is not intended to replace advice given to you by your health care provider. Make sure you discuss any questions you have with your health care provider. Document Revised: 01/07/2019 Document Reviewed: 01/07/2019 Elsevier Patient Education  2023 Elsevier Inc.  

## 2021-09-26 NOTE — Progress Notes (Signed)
Established Patient Office Visit  Subjective   Patient ID: Mark Thornton, male    DOB: [birth year]  Age: 51 y.o. MRN: [id number]  Chief Complaint  Patient presents with   Follow-up   Hypertension    HPI  Romon Devereux is a 51 y/o male who has history of hypertension, alcohol use disorder, presents for routine visit. He was started on Atorvastatin 10 mg during his last visit for ASCVD of 17.7%.  He states that he is compliant with his medications, denies side effects and continues to make healthy lifestyle changes.  Overall, he states that he is doing well and offers no further complaints  Review of Systems  Constitutional: Negative.   Eyes: Negative.   Respiratory: Negative.    Cardiovascular: Negative.   Skin: Negative.   Neurological: Negative.   Psychiatric/Behavioral: Negative.        Objective:     BP 119/78 (BP Location: Right Arm, Patient Position: Sitting, Cuff Size: Large)   Pulse 73   Temp 97.8 F (36.6 C) (Oral)   Resp 16   Ht 5' 10"  (1.778 m)   Wt 206 lb 14.4 oz (93.8 kg)   SpO2 97%   BMI 29.69 kg/m  BP Readings from Last 3 Encounters:  09/26/21 119/78  08/28/21 131/86  08/21/21 (!) 144/80   Wt Readings from Last 3 Encounters:  09/26/21 206 lb 14.4 oz (93.8 kg)  08/28/21 207 lb 12.8 oz (94.3 kg)  08/21/21 208 lb 14.4 oz (94.8 kg)      Physical Exam HENT:     Head: Normocephalic and atraumatic.     Mouth/Throat:     Mouth: Mucous membranes are moist.  Eyes:     Extraocular Movements: Extraocular movements intact.     Conjunctiva/sclera: Conjunctivae normal.     Pupils: Pupils are equal, round, and reactive to light.  Cardiovascular:     Rate and Rhythm: Normal rate and regular rhythm.     Pulses: Normal pulses.     Heart sounds: Normal heart sounds.  Pulmonary:     Effort: Pulmonary effort is normal.     Breath sounds: Normal breath sounds.  Skin:    General: Skin is warm.  Neurological:     General: No focal deficit present.      Mental Status: He is alert and oriented to person, place, and time. Mental status is at baseline.  Psychiatric:        Mood and Affect: Mood normal.        Behavior: Behavior normal.        Thought Content: Thought content normal.        Judgment: Judgment normal.      No results found for any visits on 09/26/21.  Last CBC Lab Results  Component Value Date   WBC 4.7 02/24/2021   HGB 14.5 02/24/2021   HCT 41.7 02/24/2021   MCV 85.3 02/24/2021   MCH 29.7 02/24/2021   RDW 11.9 02/24/2021   PLT 331 48/25/0037   Last metabolic panel Lab Results  Component Value Date   GLUCOSE 93 05/16/2021   NA 140 05/16/2021   K 4.0 05/16/2021   CL 96 05/16/2021   CO2 28 02/24/2021   BUN 12 05/16/2021   CREATININE 0.88 05/16/2021   EGFR 104 05/16/2021   CALCIUM 9.8 05/16/2021   PROT 7.7 05/16/2021   ALBUMIN 4.7 05/16/2021   LABGLOB 3.0 05/16/2021   AGRATIO 1.6 05/16/2021   BILITOT 0.6 05/16/2021   ALKPHOS 65 05/16/2021  AST 22 05/16/2021   ALT 21 02/24/2021   ANIONGAP 11 02/24/2021   Last lipids Lab Results  Component Value Date   CHOL 182 08/21/2021   HDL 32 (L) 08/21/2021   LDLCALC 122 (H) 08/21/2021   TRIG 154 (H) 08/21/2021   CHOLHDL 5.7 (H) 08/21/2021   Last hemoglobin A1c Lab Results  Component Value Date   HGBA1C 4.9 02/25/2021   Last vitamin D Lab Results  Component Value Date   VD25OH 24.7 (L) 08/21/2021      The 10-year ASCVD risk score (Arnett DK, et al., 2019) is: 14.9%    Assessment & Plan:   1. Essential hypertension -Blood pressure is under control, he will continue current medication, DASH diet and exercise as tolerated. - amLODipine (NORVASC) 10 MG tablet; Take 1 tablet (10 mg total) by mouth daily.  Dispense: 90 tablet; Refill: 1 - Vitamin D (25 hydroxy); Future  2. Elevated lipids -His ASCVD is 14.9%, he will continue current medication, low-fat cholesterol diet, exercise as tolerated and will recheck lipids in 3 months. - atorvastatin  (LIPITOR) 10 MG tablet; Take 1 tablet (10 mg total) by mouth daily.  Dispense: 90 tablet; Refill: 1 - Lipid panel; Future    Return in about 14 weeks (around 01/02/2022), or if symptoms worsen or fail to improve.    Alonia Dibuono Jerold Coombe, NP

## 2021-10-10 ENCOUNTER — Other Ambulatory Visit: Payer: Self-pay

## 2021-11-08 ENCOUNTER — Other Ambulatory Visit: Payer: Self-pay

## 2021-11-26 ENCOUNTER — Other Ambulatory Visit: Payer: Self-pay

## 2021-12-23 ENCOUNTER — Other Ambulatory Visit: Payer: Self-pay

## 2021-12-25 ENCOUNTER — Other Ambulatory Visit: Payer: Self-pay

## 2021-12-25 DIAGNOSIS — I10 Essential (primary) hypertension: Secondary | ICD-10-CM

## 2021-12-25 DIAGNOSIS — E785 Hyperlipidemia, unspecified: Secondary | ICD-10-CM

## 2021-12-26 LAB — LIPID PANEL
Chol/HDL Ratio: 2.9 ratio (ref 0.0–5.0)
Cholesterol, Total: 111 mg/dL (ref 100–199)
HDL: 38 mg/dL — ABNORMAL LOW (ref >39)
LDL Chol Calc (NIH): 58 mg/dL (ref 0–99)
Triglycerides: 72 mg/dL (ref 0–149)
VLDL Cholesterol Cal: 15 mg/dL (ref 5–40)

## 2021-12-26 LAB — VITAMIN D 25 HYDROXY (VIT D DEFICIENCY, FRACTURES): Vit D, 25-Hydroxy: 24.6 ng/mL — ABNORMAL LOW (ref 30.0–100.0)

## 2021-12-27 LAB — FECAL OCCULT BLOOD, IMMUNOCHEMICAL: Fecal Occult Bld: NEGATIVE

## 2022-01-01 ENCOUNTER — Ambulatory Visit: Payer: Self-pay | Admitting: Gerontology

## 2022-01-01 ENCOUNTER — Other Ambulatory Visit: Payer: Self-pay

## 2022-01-01 VITALS — BP 131/81 | HR 76 | Temp 97.8°F | Resp 16 | Ht 70.0 in | Wt 211.8 lb

## 2022-01-01 DIAGNOSIS — I10 Essential (primary) hypertension: Secondary | ICD-10-CM

## 2022-01-01 DIAGNOSIS — E559 Vitamin D deficiency, unspecified: Secondary | ICD-10-CM

## 2022-01-01 DIAGNOSIS — T7840XA Allergy, unspecified, initial encounter: Secondary | ICD-10-CM

## 2022-01-01 MED ORDER — VITAMIN D (ERGOCALCIFEROL) 1.25 MG (50000 UNIT) PO CAPS
50000.0000 [IU] | ORAL_CAPSULE | ORAL | 0 refills | Status: DC
  Filled 2022-01-01: qty 12, 84d supply, fill #0

## 2022-01-01 MED ORDER — CETIRIZINE HCL 10 MG PO TABS
10.0000 mg | ORAL_TABLET | Freq: Every day | ORAL | 2 refills | Status: DC
  Filled 2022-01-01: qty 30, 30d supply, fill #0
  Filled 2022-02-06 (×2): qty 30, 30d supply, fill #1
  Filled 2022-03-11: qty 30, 30d supply, fill #2

## 2022-01-01 NOTE — Progress Notes (Signed)
Established Patient Office Visit  Subjective   Patient ID: Mark Thornton, male    DOB: 05/08/1970  Age: 51 y.o. MRN: [id number]  No chief complaint on file.   HPI  Mark Thornton is a 51 y/o male who has history of hypertension and alcohol use disorder who presents for routine visit and lab review. His labs were unremarkable aside from an HDL of 38 mg/dL and a Vitamin D of 24.6 ng/mL. He states that he takes all of his medications as prescribed and is experiencing no adverse effects. He checks his blood pressure every day and records it in a log, but he forgot to bring his log in with him today. He states that his blood pressure typically ranges between 950-722 systolic, with an occasional reading of 150. His blood pressure was initially 160/89 in the clinic, but it was 131/81 on recheck. He is currently staying at [facility] and has been clean from drugs and alcohol for 9 months. Overall, he states that he is doing well and offers no further complaints.   Review of Systems  Constitutional: Negative.   HENT: Negative.    Eyes: Negative.   Respiratory: Negative.    Cardiovascular: Negative.   Gastrointestinal: Negative.   Genitourinary: Negative.   Musculoskeletal: Negative.   Neurological: Negative.   Endo/Heme/Allergies: Negative.   Psychiatric/Behavioral: Negative.        Objective:     BP 131/81 (BP Location: Right Arm, Patient Position: Sitting, Cuff Size: Large)   Pulse 76   Temp 97.8 F (36.6 C) (Oral)   Resp 16   Ht _0  (1.778 m)   Wt 211 lb 12.8 oz (96.1 kg)   SpO2 97%   BMI 30.39 kg/m  BP Readings from Last 3 Encounters:  01/01/22 131/81  12/25/21 137/82  09/26/21 119/78   Wt Readings from Last 3 Encounters:  01/01/22 211 lb 12.8 oz (96.1 kg)  12/25/21 210 lb 8 oz (95.5 kg)  09/26/21 206 lb 14.4 oz (93.8 kg)      Physical Exam Constitutional:      Appearance: Normal appearance.  Eyes:     Conjunctiva/sclera: Conjunctivae normal.   Cardiovascular:     Rate and Rhythm: Normal rate and regular rhythm.     Heart sounds: Normal heart sounds.  Pulmonary:     Effort: Pulmonary effort is normal.     Breath sounds: Normal breath sounds.  Neurological:     General: No focal deficit present.     Mental Status: He is alert. Mental status is at baseline.  Psychiatric:        Mood and Affect: Mood normal.        Behavior: Behavior normal.        Thought Content: Thought content normal.        Judgment: Judgment normal.      No results found for any visits on 01/01/22.  Last CBC Lab Results  Component Value Date   WBC 4.7 02/24/2021   HGB 14.5 02/24/2021   HCT 41.7 02/24/2021   MCV 85.3 02/24/2021   MCH 29.7 02/24/2021   RDW 11.9 02/24/2021   PLT 331 57/50/5183   Last metabolic panel Lab Results  Component Value Date   GLUCOSE 93 05/16/2021   NA 140 05/16/2021   K 4.0 05/16/2021   CL 96 05/16/2021   CO2 28 02/24/2021   BUN 12 05/16/2021   CREATININE 0.88 05/16/2021   EGFR 104 05/16/2021   CALCIUM 9.8 05/16/2021   PROT 7.7  05/16/2021   ALBUMIN 4.7 05/16/2021   LABGLOB 3.0 05/16/2021   AGRATIO 1.6 05/16/2021   BILITOT 0.6 05/16/2021   ALKPHOS 65 05/16/2021   AST 22 05/16/2021   ALT 21 02/24/2021   ANIONGAP 11 02/24/2021   Last lipids Lab Results  Component Value Date   CHOL 111 12/25/2021   HDL 38 (L) 12/25/2021   LDLCALC 58 12/25/2021   TRIG 72 12/25/2021   CHOLHDL 2.9 12/25/2021   Last hemoglobin A1c Lab Results  Component Value Date   HGBA1C 4.9 02/25/2021   Last vitamin D Lab Results  Component Value Date   VD25OH 24.6 (L) 12/25/2021      The ASCVD Risk score (Arnett DK, et al., 2019) failed to calculate for the following reasons:   The valid total cholesterol range is 130 to 320 mg/dL    Assessment & Plan:   Essential hypertension - Blood pressure is under control, so his current medication regimen will be continued. DASH diet and exercise as tolerated. - Lipid panel;  Future  Vitamin D deficiency - His vitamin D levels were decreased on his 12/25/21 recheck (24.6 ng/mL). Vitamin D medication will be ordered and his levels will be rechecked in 3 months. He was educated to take the medication once weekly. - Vitamin D, Ergocalciferol, (DRISDOL) 1.25 MG (50000 UNIT) CAPS capsule; Take 1 capsule (50,000 Units total) by mouth every 7 (seven) days. Dispense: 12; Refill: 0 - Vitamin D (25 hydroxy); Future  Allergy - His cetirizine will be refilled to help control seasonal allergy symptoms. - cetirizine (ZYRTEC ALLERGY) 10 MG tablet; Take 1 tablet (10 mg total) by mouth daily. Dispense: 30; Refill: 2   Return in about 3 months (around 04/03/2022), or if symptoms worsen or fail to improve.    Odelia Gage

## 2022-01-01 NOTE — Patient Instructions (Signed)

## 2022-02-06 ENCOUNTER — Other Ambulatory Visit: Payer: Self-pay

## 2022-03-11 ENCOUNTER — Other Ambulatory Visit: Payer: Self-pay

## 2022-04-02 ENCOUNTER — Ambulatory Visit: Payer: Self-pay | Admitting: Gerontology

## 2022-04-02 ENCOUNTER — Encounter: Payer: Self-pay | Admitting: Gerontology

## 2022-04-02 ENCOUNTER — Other Ambulatory Visit: Payer: Self-pay

## 2022-04-02 VITALS — BP 143/82 | HR 75 | Wt 208.4 lb

## 2022-04-02 DIAGNOSIS — I10 Essential (primary) hypertension: Secondary | ICD-10-CM

## 2022-04-02 DIAGNOSIS — T7840XA Allergy, unspecified, initial encounter: Secondary | ICD-10-CM

## 2022-04-02 DIAGNOSIS — E785 Hyperlipidemia, unspecified: Secondary | ICD-10-CM

## 2022-04-02 MED ORDER — CETIRIZINE HCL 10 MG PO TABS
10.0000 mg | ORAL_TABLET | Freq: Every day | ORAL | 2 refills | Status: DC
  Filled 2022-04-02: qty 30, 30d supply, fill #0
  Filled 2022-05-09 (×2): qty 30, 30d supply, fill #1
  Filled 2022-06-13: qty 30, 30d supply, fill #2

## 2022-04-02 MED ORDER — ATORVASTATIN CALCIUM 10 MG PO TABS
10.0000 mg | ORAL_TABLET | Freq: Every day | ORAL | 1 refills | Status: DC
  Filled 2022-04-02: qty 90, 90d supply, fill #0
  Filled 2022-07-09: qty 90, 90d supply, fill #1

## 2022-04-02 MED ORDER — AMLODIPINE BESYLATE 10 MG PO TABS
10.0000 mg | ORAL_TABLET | Freq: Every day | ORAL | 1 refills | Status: DC
  Filled 2022-04-02: qty 90, 90d supply, fill #0
  Filled 2022-07-09: qty 90, 90d supply, fill #1

## 2022-04-02 NOTE — Patient Instructions (Signed)
Heart-Healthy Eating Plan Many factors influence your heart health, including eating and exercise habits. HeSmoking Tobacco Information, Adult Smoking tobacco can be harmful to your health. Tobacco contains a toxic colorless chemical called nicotine. Nicotine causes changes in your brain that make you want more and more. This is called addiction. This can make it hard to stop smoking once you start. Tobacco also has other toxic chemicals that can hurt your body and raise your risk of many cancers. Menthol or "lite" tobacco or cigarette brands are not safer than regular brands. How can smoking tobacco affect me? Smoking tobacco puts you at risk for: Cancer. Smoking is most commonly associated with lung cancer, but can also lead to cancer in other parts of the body. Chronic obstructive pulmonary disease (COPD). This is a long-term lung condition that makes it hard to breathe. It also gets worse over time. High blood pressure (hypertension), heart disease, stroke, heart attack, and lung infections, such as pneumonia. Cataracts. This is when the lenses in the eyes become clouded. Digestive problems. This may include peptic ulcers, heartburn, and gastroesophageal reflux disease (GERD). Oral health problems, such as gum disease, mouth sores, and tooth loss. Loss of taste and smell. Smoking also affects how you look and smell. Smoking may cause: Wrinkles. Yellow or stained teeth, fingers, and fingernails. Bad breath. Bad-smelling clothes and hair. Smoking tobacco can also affect your social life, because: It may be challenging to find places to smoke when away from home. Many workplaces, [employer], hotels, and public places are tobacco-free. Smoking is expensive. This is due to the cost of tobacco and the long-term costs of treating health problems from smoking. Secondhand smoke may affect those around you. Secondhand smoke can cause lung cancer, breathing problems, and heart disease. Children of  smokers have a higher risk for: Sudden infant death syndrome (SIDS). Ear infections. Lung infections. What actions can I take to prevent health problems? Quit smoking  Do not start smoking. Quit if you already smoke. Do not replace cigarette smoking with vaping devices, such as e-cigarettes. Make a plan to quit smoking and commit to it. Look for programs to help you, and ask your health care provider for recommendations and ideas. Set a date and write down all the reasons you want to quit. Let your friends and family know you are quitting so they can help and support you. Consider finding friends who also want to quit. It can be easier to quit with someone else, so that you can support each other. Talk with your health care provider about using nicotine replacement medicines to help you quit. These include gum, lozenges, patches, sprays, or pills. If you try to quit but return to smoking, stay positive. It is common to slip up when you first quit, so take it one day at a time. Be prepared for cravings. When you feel the urge to smoke, chew gum or suck on hard candy. Lifestyle Stay busy. Take care of your body. Get plenty of exercise, eat a healthy diet, and drink plenty of water. Find ways to manage your stress, such as meditation, yoga, exercise, or time spent with friends and family. Ask your health care provider about having regular tests (screenings) to check for cancer. This may include blood tests, imaging tests, and other tests. Where to find support To get support to quit smoking, consider: Asking your health care provider for more information and resources. Joining a support group for people who want to quit smoking in your local community. There  are many effective programs that may help you to quit. Calling the smokefree.gov counselor helpline at 1-800-QUIT-NOW 820-663-8892). Where to find more information You may find more information about quitting smoking from: Centers for  Disease Control and Prevention: https://www.chang-huffman.com/ https://hall.com/: smokefree.gov American Lung Association: freedomfromsmoking.org Contact a health care provider if: You have problems breathing. Your lips, nose, or fingers turn blue. You have chest pain. You are coughing up blood. You feel like you will faint. You have other health changes that cause you to worry. Summary Smoking tobacco can negatively affect your health, the health of those around you, your finances, and your social life. Do not start smoking. Quit if you already smoke. If you need help quitting, ask your health care provider. Consider joining a support group for people in your local community who want to quit smoking. There are many effective programs that may help you to quit. This information is not intended to replace advice given to you by your health care provider. Make sure you discuss any questions you have with your health care provider. Document Revised: 01/29/2021 Document Reviewed: 01/29/2021 Elsevier Patient Education  [place] is also called coronary health. Coronary risk increases with abnormal blood fat (lipid) levels. A heart-healthy eating plan includes limiting unhealthy fats, increasing healthy fats, limiting salt (sodium) intake, and making other diet and lifestyle changes. What is my plan? Your health care provider may recommend that: You limit your fat intake to _________% or less of your total calories each day. You limit your saturated fat intake to _________% or less of your total calories each day. You limit the amount of cholesterol in your diet to less than _________ mg per day. You limit the amount of sodium in your diet to less than _________ mg per day. What are tips for following this plan? Cooking Cook foods using methods other than frying. Baking, boiling, grilling, and broiling are all good options. Other ways to reduce fat include: Removing the skin from  poultry. Removing all visible fats from meats. Steaming vegetables in water or broth. Meal planning  At meals, imagine dividing your plate into fourths: Fill one-half of your plate with vegetables and green salads. Fill one-fourth of your plate with whole grains. Fill one-fourth of your plate with lean protein foods. Eat 2-4 cups of vegetables per day. One cup of vegetables equals 1 cup (91 g) broccoli or cauliflower florets, 2 medium carrots, 1 large bell pepper, 1 large sweet potato, 1 large tomato, 1 medium white potato, 2 cups (150 g) raw leafy greens. Eat 1-2 cups of fruit per day. One cup of fruit equals 1 small apple, 1 large banana, 1 cup (237 g) mixed fruit, 1 large orange,  cup (82 g) dried fruit, 1 cup (240 mL) 100% fruit juice. Eat more foods that contain soluble fiber. Examples include apples, broccoli, carrots, beans, peas, and barley. Aim to get 25-30 g of fiber per day. Increase your consumption of legumes, nuts, and seeds to 4-5 servings per week. One serving of dried beans or legumes equals  cup (90 g) cooked, 1 serving of nuts is  oz (12 almonds, 24 pistachios, or 7 walnut halves), and 1 serving of seeds equals  oz (8 g). Fats Choose healthy fats more often. Choose monounsaturated and polyunsaturated fats, such as olive and canola oils, avocado oil, flaxseeds, walnuts, almonds, and seeds. Eat more omega-3 fats. Choose salmon, mackerel, sardines, tuna, flaxseed oil, and ground flaxseeds. Aim to eat fish at least 2  times each week. Check food labels carefully to identify foods with trans fats or high amounts of saturated fat. Limit saturated fats. These are found in animal products, such as meats, butter, and cream. Plant sources of saturated fats include palm oil, palm kernel oil, and coconut oil. Avoid foods with partially hydrogenated oils in them. These contain trans fats. Examples are stick margarine, some tub margarines, cookies, crackers, and other baked goods. Avoid  fried foods. General information Eat more home-cooked food and less restaurant, buffet, and fast food. Limit or avoid alcohol. Limit foods that are high in added sugar and simple starches such as foods made using white refined flour (white breads, pastries, sweets). Lose weight if you are overweight. Losing just 5-10% of your body weight can help your overall health and prevent diseases such as diabetes and heart disease. Monitor your sodium intake, especially if you have high blood pressure. Talk with your health care provider about your sodium intake. Try to incorporate more vegetarian meals weekly. What foods should I eat? Fruits All fresh, canned (in natural juice), or frozen fruits. Vegetables Fresh or frozen vegetables (raw, steamed, roasted, or grilled). Green salads. Grains Most grains. Choose whole wheat and whole grains most of the time. Rice and pasta, including brown rice and pastas made with whole wheat. Meats and other proteins Lean, well-trimmed beef, veal, pork, and lamb. Chicken and [place] without skin. All fish and shellfish. Wild duck, rabbit, pheasant, and venison. Egg whites or low-cholesterol egg substitutes. Dried beans, peas, lentils, and tofu. Seeds and most nuts. Dairy Low-fat or nonfat cheeses, including ricotta and mozzarella. Skim or 1% milk (liquid, powdered, or evaporated). Buttermilk made with low-fat milk. Nonfat or low-fat yogurt. Fats and oils Non-hydrogenated (trans-free) margarines. Vegetable oils, including soybean, sesame, sunflower, olive, avocado, peanut, safflower, corn, canola, and cottonseed. Salad dressings or mayonnaise made with a vegetable oil. Beverages Water (mineral or sparkling). Coffee and tea. Unsweetened ice tea. Diet beverages. Sweets and desserts Sherbet, gelatin, and fruit ice. Small amounts of dark chocolate. Limit all sweets and desserts. Seasonings and condiments All seasonings and condiments. The items listed above may not be a  complete list of foods and beverages you can eat. Contact a dietitian for more options. What foods should I avoid? Fruits Canned fruit in heavy syrup. Fruit in cream or butter sauce. Fried fruit. Limit coconut. Vegetables Vegetables cooked in cheese, cream, or butter sauce. Fried vegetables. Grains Breads made with saturated or trans fats, oils, or whole milk. Croissants. Sweet rolls. Donuts. High-fat crackers, such as cheese crackers and chips. Meats and other proteins Fatty meats, such as hot dogs, ribs, sausage, bacon, rib-eye roast or steak. High-fat deli meats, such as salami and bologna. Caviar. Domestic duck and goose. Organ meats, such as liver. Dairy Cream, sour cream, cream cheese, and creamed cottage cheese. Whole-milk cheeses. Whole or 2% milk (liquid, evaporated, or condensed). Whole buttermilk. Cream sauce or high-fat cheese sauce. Whole-milk yogurt. Fats and oils Meat fat, or shortening. Cocoa butter, hydrogenated oils, palm oil, coconut oil, palm kernel oil. Solid fats and shortenings, including bacon fat, salt pork, lard, and butter. Nondairy cream substitutes. Salad dressings with cheese or sour cream. Beverages Regular sodas and any drinks with added sugar. Sweets and desserts Frosting. Pudding. Cookies. Cakes. Pies. Milk chocolate or white chocolate. Buttered syrups. Full-fat ice cream or ice cream drinks. The items listed above may not be a complete list of foods and beverages to avoid. Contact a dietitian for more information. Summary Heart-healthy meal  planning includes limiting unhealthy fats, increasing healthy fats, limiting salt (sodium) intake and making other diet and lifestyle changes. Lose weight if you are overweight. Losing just 5-10% of your body weight can help your overall health and prevent diseases such as diabetes and heart disease. Focus on eating a balance of foods, including fruits and vegetables, low-fat or nonfat dairy, lean protein, nuts and legumes,  whole grains, and heart-healthy oils and fats. This information is not intended to replace advice given to you by your health care provider. Make sure you discuss any questions you have with your health care provider. Document Revised: 03/11/2021 Document Reviewed: 03/11/2021 Elsevier Patient Education  [place] Eating Plan DASH stands for Dietary Approaches to Stop Hypertension. The DASH eating plan is a healthy eating plan that has been shown to: Reduce high blood pressure (hypertension). Reduce your risk for type 2 diabetes, heart disease, and stroke. Help with weight loss. What are tips for following this plan? Reading food labels Check food labels for the amount of salt (sodium) per serving. Choose foods with less than 5 percent of the Daily Value of sodium. Generally, foods with less than 300 milligrams (mg) of sodium per serving fit into this eating plan. To find whole grains, look for the word "whole" as the first word in the ingredient list. Shopping Buy products labeled as "low-sodium" or "no salt added." Buy fresh foods. Avoid canned foods and pre-made or frozen meals. Cooking Avoid adding salt when cooking. Use salt-free seasonings or herbs instead of table salt or sea salt. Check with your health care provider or pharmacist before using salt substitutes. Do not fry foods. Cook foods using healthy methods such as baking, boiling, grilling, roasting, and broiling instead. Cook with heart-healthy oils, such as olive, canola, avocado, soybean, or sunflower oil. Meal planning  Eat a balanced diet that includes: 4 or more servings of fruits and 4 or more servings of vegetables each day. Try to fill one-half of your plate with fruits and vegetables. 6-8 servings of whole grains each day. Less than 6 oz (170 g) of lean meat, poultry, or fish each day. A 3-oz (85-g) serving of meat is about the same size as a deck of cards. One egg equals 1 oz (28 g). 2-3 servings of  low-fat dairy each day. One serving is 1 cup (237 mL). 1 serving of nuts, seeds, or beans 5 times each week. 2-3 servings of heart-healthy fats. Healthy fats called omega-3 fatty acids are found in foods such as walnuts, flaxseeds, fortified milks, and eggs. These fats are also found in cold-water fish, such as sardines, salmon, and mackerel. Limit how much you eat of: Canned or prepackaged foods. Food that is high in trans fat, such as some fried foods. Food that is high in saturated fat, such as fatty meat. Desserts and other sweets, sugary drinks, and other foods with added sugar. Full-fat dairy products. Do not salt foods before eating. Do not eat more than 4 egg yolks a week. Try to eat at least 2 vegetarian meals a week. Eat more home-cooked food and less restaurant, buffet, and fast food. Lifestyle When eating at a restaurant, ask that your food be prepared with less salt or no salt, if possible. If you drink alcohol: Limit how much you use to: 0-1 drink a day for women who are not pregnant. 0-2 drinks a day for men. Be aware of how much alcohol is in your drink. In the [place] one drink equals  one 12 oz bottle of beer (355 mL), one 5 oz glass of wine (148 mL), or one 1 oz glass of hard liquor (44 mL). General information Avoid eating more than 2,300 mg of salt a day. If you have hypertension, you may need to reduce your sodium intake to 1,500 mg a day. Work with your health care provider to maintain a healthy body weight or to lose weight. Ask what an ideal weight is for you. Get at least 30 minutes of exercise that causes your heart to beat faster (aerobic exercise) most days of the week. Activities may include walking, swimming, or biking. Work with your health care provider or dietitian to adjust your eating plan to your individual calorie needs. What foods should I eat? Fruits All fresh, dried, or frozen fruit. Canned fruit in natural juice (without added  sugar). Vegetables Fresh or frozen vegetables (raw, steamed, roasted, or grilled). Low-sodium or reduced-sodium tomato and vegetable juice. Low-sodium or reduced-sodium tomato sauce and tomato paste. Low-sodium or reduced-sodium canned vegetables. Grains Whole-grain or whole-wheat bread. Whole-grain or whole-wheat pasta. Brown rice. [place]. Bulgur. Whole-grain and low-sodium cereals. Pita bread. Low-fat, low-sodium crackers. Whole-wheat flour tortillas. Meats and other proteins Skinless chicken or [place]. Ground chicken or [place]. Pork with fat trimmed off. Fish and seafood. Egg whites. Dried beans, peas, or lentils. Unsalted nuts, nut butters, and seeds. Unsalted canned beans. Lean cuts of beef with fat trimmed off. Low-sodium, lean precooked or cured meat, such as sausages or meat loaves. Dairy Low-fat (1%) or fat-free (skim) milk. Reduced-fat, low-fat, or fat-free cheeses. Nonfat, low-sodium ricotta or cottage cheese. Low-fat or nonfat yogurt. Low-fat, low-sodium cheese. Fats and oils Soft margarine without trans fats. Vegetable oil. Reduced-fat, low-fat, or light mayonnaise and salad dressings (reduced-sodium). Canola, safflower, olive, avocado, soybean, and sunflower oils. Avocado. Seasonings and condiments Herbs. Spices. Seasoning mixes without salt. Other foods Unsalted popcorn and pretzels. Fat-free sweets. The items listed above may not be a complete list of foods and beverages you can eat. Contact a dietitian for more information. What foods should I avoid? Fruits Canned fruit in a light or heavy syrup. Fried fruit. Fruit in cream or butter sauce. Vegetables Creamed or fried vegetables. Vegetables in a cheese sauce. Regular canned vegetables (not low-sodium or reduced-sodium). Regular canned tomato sauce and paste (not low-sodium or reduced-sodium). Regular tomato and vegetable juice (not low-sodium or reduced-sodium). Angie Fava. Olives. Grains Baked goods made with fat, such  as croissants, muffins, or some breads. Dry pasta or rice meal packs. Meats and other proteins Fatty cuts of meat. Ribs. Fried meat. Berniece Salines. Bologna, salami, and other precooked or cured meats, such as sausages or meat loaves. Fat from the back of a pig (fatback). Bratwurst. Salted nuts and seeds. Canned beans with added salt. Canned or smoked fish. Whole eggs or egg yolks. Chicken or [place] with skin. Dairy Whole or 2% milk, cream, and half-and-half. Whole or full-fat cream cheese. Whole-fat or sweetened yogurt. Full-fat cheese. Nondairy creamers. Whipped toppings. Processed cheese and cheese spreads. Fats and oils Butter. Stick margarine. Lard. Shortening. Ghee. Bacon fat. Tropical oils, such as coconut, palm kernel, or palm oil. Seasonings and condiments Onion salt, garlic salt, seasoned salt, table salt, and sea salt. Worcestershire sauce. Tartar sauce. Barbecue sauce. Teriyaki sauce. Soy sauce, including reduced-sodium. Steak sauce. Canned and packaged gravies. Fish sauce. Oyster sauce. Cocktail sauce. Store-bought horseradish. Ketchup. Mustard. Meat flavorings and tenderizers. Bouillon cubes. Hot sauces. Pre-made or packaged marinades. Pre-made or packaged taco seasonings. Relishes. Regular salad  dressings. Other foods Salted popcorn and pretzels. The items listed above may not be a complete list of foods and beverages you should avoid. Contact a dietitian for more information. Where to find more information National Heart, Lung, and Blood Institute: https://wilson-eaton.com/ American Heart Association: www.heart.org Academy of Nutrition and Dietetics: www.eatright.[place]: www.kidney.org Summary The DASH eating plan is a healthy eating plan that has been shown to reduce high blood pressure (hypertension). It may also reduce your risk for type 2 diabetes, heart disease, and stroke. When on the DASH eating plan, aim to eat more fresh fruits and vegetables, whole grains, lean  proteins, low-fat dairy, and heart-healthy fats. With the DASH eating plan, you should limit salt (sodium) intake to 2,300 mg a day. If you have hypertension, you may need to reduce your sodium intake to 1,500 mg a day. Work with your health care provider or dietitian to adjust your eating plan to your individual calorie needs. This information is not intended to replace advice given to you by your health care provider. Make sure you discuss any questions you have with your health care provider. Document Revised: 01/07/2019 Document Reviewed: 01/07/2019 Elsevier Patient Education  [place].

## 2022-04-02 NOTE — Progress Notes (Signed)
Established Patient Office Visit  Subjective   Patient ID: Mark Thornton, male    DOB: 1970/07/30  Age: 52 y.o. MRN: [id number]  Chief Complaint  Patient presents with   Follow-up    HPI  Mark Thornton is a 52 y/o male who has history of hypertension and alcohol use disorder who presents for routine visit and medication refill. He verbalized being compliant with his medication regimen and reports checking his blood pressure every morning. He states that his systolic readings are usually between 130/88 mmHg to 140/79 mmHg. He states that he usually documents his daily blood pressure readings however, he verbalized that he didn't bring his log with him. He reports no alcohol intake for over 1 year and states that he intends on continuing. He verbalized having a 20 year history of smoking and states that he has cut down his cigarette use from a pack a day to a pack every 3 days. He did verbalized intention of quitting. He denies chest pain, shortness of breath, dizziness or dyspnea with exertion. He states that his mood is good and he enjoys his work. Overall, he states that things are going well and endorsed no additional complaint.     Review of Systems  Constitutional: Negative.   HENT: Negative.    Eyes: Negative.   Respiratory: Negative.    Cardiovascular: Negative.   Gastrointestinal: Negative.   Genitourinary: Negative.   Musculoskeletal: Negative.   Skin: Negative.   Neurological: Negative.   Endo/Heme/Allergies: Negative.   Psychiatric/Behavioral: Negative.       Objective:     Pulse 75   Wt 208 lb 6.4 oz (94.5 kg)   SpO2 96%   BMI 29.90 kg/m  BP Readings from Last 3 Encounters:  04/02/22 (!) 143/82  01/01/22 131/81  12/25/21 137/82   Wt Readings from Last 3 Encounters:  04/02/22 208 lb 6.4 oz (94.5 kg)  01/01/22 211 lb 12.8 oz (96.1 kg)  12/25/21 210 lb 8 oz (95.5 kg)   SpO2 Readings from Last 3 Encounters:  04/02/22 96%  01/01/22 97%  12/25/21  96%      Physical Exam Constitutional:      Appearance: Normal appearance.  HENT:     Head: Normocephalic.  Eyes:     Pupils: Pupils are equal, round, and reactive to light.  Cardiovascular:     Rate and Rhythm: Normal rate and regular rhythm.     Pulses: Normal pulses.     Heart sounds: Normal heart sounds.  Pulmonary:     Effort: Pulmonary effort is normal.     Breath sounds: Normal breath sounds.  Abdominal:     General: Abdomen is flat. Bowel sounds are normal.     Palpations: Abdomen is soft.  Musculoskeletal:        General: Normal range of motion.  Skin:    General: Skin is warm.  Neurological:     General: No focal deficit present.     Mental Status: He is alert.  Psychiatric:        Mood and Affect: Mood normal.        Thought Content: Thought content normal.    No results found for any visits on 04/02/22.  Last CBC Lab Results  Component Value Date   WBC 4.7 02/24/2021   HGB 14.5 02/24/2021   HCT 41.7 02/24/2021   MCV 85.3 02/24/2021   MCH 29.7 02/24/2021   RDW 11.9 02/24/2021   PLT 331 [id number]   Last metabolic panel Lab  Results  Component Value Date   GLUCOSE 93 05/16/2021   NA 140 05/16/2021   K 4.0 05/16/2021   CL 96 05/16/2021   CO2 28 02/24/2021   BUN 12 05/16/2021   CREATININE 0.88 05/16/2021   EGFR 104 05/16/2021   CALCIUM 9.8 05/16/2021   PROT 7.7 05/16/2021   ALBUMIN 4.7 05/16/2021   LABGLOB 3.0 05/16/2021   AGRATIO 1.6 05/16/2021   BILITOT 0.6 05/16/2021   ALKPHOS 65 05/16/2021   AST 22 05/16/2021   ALT 21 02/24/2021   ANIONGAP 11 02/24/2021   Last lipids Lab Results  Component Value Date   CHOL 111 12/25/2021   HDL 38 (L) 12/25/2021   LDLCALC 58 12/25/2021   TRIG 72 12/25/2021   CHOLHDL 2.9 12/25/2021   Last vitamin D Lab Results  Component Value Date   VD25OH 24.6 (L) 12/25/2021   Last vitamin B12 and Folate No results found for: "VITAMINB12", "FOLATE"    The ASCVD Risk score (Arnett DK, et al., 2019)  failed to calculate for the following reasons:   The valid total cholesterol range is 130 to 320 mg/dL    Assessment & Plan:  1. Essential hypertension Continue current treatment regimen. His hypertension is not under control. His current BP is 143/82 mmHg. His goal should be a BP of 120/80 mmHg.  - amLODipine (NORVASC) 10 MG tablet; Take 1 tablet (10 mg total) by mouth daily.  Dispense: 90 tablet; Refill: 1 He was advised to record his blood pressure daily and bring log to next clinical appointment  He was advised to continue DASH diet, decrease foods high in sodium, and exercise as tolerated.  2. Elevated lipids Continue current treatment regimen , low fat/cholesterol diet and exercise as tolerated. - atorvastatin (LIPITOR) 10 MG tablet; Take 1 tablet (10 mg total) by mouth daily.  Dispense: 90 tablet; Refill: 1  3. Allergy, initial encounter - cetirizine (ZYRTEC ALLERGY) 10 MG tablet; Take 1 tablet (10 mg total) by mouth daily.  Dispense: 30 tablet; Refill: 2  The following Labs would be checked: -Vit D, Future  -CBC, Future  -CMET, Future       F/U: 07/01/2022 in [place], FNP

## 2022-04-03 ENCOUNTER — Ambulatory Visit: Payer: Self-pay | Admitting: Gerontology

## 2022-05-09 ENCOUNTER — Other Ambulatory Visit: Payer: Self-pay

## 2022-05-14 ENCOUNTER — Other Ambulatory Visit: Payer: Self-pay

## 2022-06-13 ENCOUNTER — Other Ambulatory Visit: Payer: Self-pay

## 2022-06-20 ENCOUNTER — Other Ambulatory Visit: Payer: Self-pay

## 2022-07-09 ENCOUNTER — Other Ambulatory Visit: Payer: Self-pay | Admitting: Gerontology

## 2022-07-09 ENCOUNTER — Other Ambulatory Visit: Payer: Self-pay

## 2022-07-09 DIAGNOSIS — T7840XA Allergy, unspecified, initial encounter: Secondary | ICD-10-CM

## 2022-07-10 ENCOUNTER — Other Ambulatory Visit: Payer: Self-pay

## 2022-07-10 ENCOUNTER — Telehealth: Payer: Self-pay | Admitting: Emergency Medicine

## 2022-07-10 MED ORDER — CETIRIZINE HCL 10 MG PO TABS
10.0000 mg | ORAL_TABLET | Freq: Every day | ORAL | 0 refills | Status: DC
  Filled 2022-07-10: qty 30, 30d supply, fill #0

## 2022-07-10 NOTE — Telephone Encounter (Signed)
Patient needs appointment for labs and OV 1 week after.

## 2022-07-10 NOTE — Telephone Encounter (Signed)
Called patient to advise that he needs a lab appointment and OV 1 week after labs. Patient not available (lives at [facility]). Left message for patient to call [facility].

## 2022-08-19 ENCOUNTER — Other Ambulatory Visit: Payer: Self-pay | Admitting: Gerontology

## 2022-08-19 ENCOUNTER — Other Ambulatory Visit: Payer: Self-pay

## 2022-08-19 DIAGNOSIS — T7840XA Allergy, unspecified, initial encounter: Secondary | ICD-10-CM

## 2022-08-19 DIAGNOSIS — E785 Hyperlipidemia, unspecified: Secondary | ICD-10-CM

## 2022-08-19 DIAGNOSIS — I10 Essential (primary) hypertension: Secondary | ICD-10-CM

## 2022-08-21 ENCOUNTER — Other Ambulatory Visit: Payer: Self-pay

## 2022-08-25 ENCOUNTER — Other Ambulatory Visit: Payer: Self-pay

## 2022-08-26 ENCOUNTER — Other Ambulatory Visit: Payer: Self-pay

## 2022-08-26 MED FILL — Cetirizine HCl Tab 10 MG: ORAL | 14 days supply | Qty: 14 | Fill #0 | Status: AC

## 2022-08-26 MED FILL — Atorvastatin Calcium Tab 10 MG (Base Equivalent): ORAL | 14 days supply | Qty: 14 | Fill #0 | Status: CN

## 2022-08-26 MED FILL — Amlodipine Besylate Tab 10 MG (Base Equivalent): ORAL | 14 days supply | Qty: 14 | Fill #0 | Status: CN

## 2022-08-28 ENCOUNTER — Other Ambulatory Visit: Payer: Self-pay

## 2022-09-09 ENCOUNTER — Other Ambulatory Visit: Payer: Self-pay | Admitting: Gerontology

## 2022-09-09 ENCOUNTER — Other Ambulatory Visit: Payer: Self-pay

## 2022-09-09 DIAGNOSIS — T7840XA Allergy, unspecified, initial encounter: Secondary | ICD-10-CM

## 2022-09-10 ENCOUNTER — Telehealth: Payer: Self-pay

## 2022-09-10 ENCOUNTER — Other Ambulatory Visit: Payer: Self-pay

## 2022-09-10 MED FILL — Cetirizine HCl Tab 10 MG: ORAL | 14 days supply | Qty: 14 | Fill #0 | Status: CN

## 2022-09-10 NOTE — Telephone Encounter (Signed)
Called pt to give info on reestablishing as a pt. Pt said "no sorry" and hung up.

## 2022-09-11 ENCOUNTER — Other Ambulatory Visit: Payer: Self-pay

## 2022-09-25 ENCOUNTER — Other Ambulatory Visit: Payer: Self-pay

## 2023-03-05 ENCOUNTER — Ambulatory Visit
Admission: EM | Admit: 2023-03-05 | Discharge: 2023-03-05 | Disposition: A | Discharge status: Home / Self Care | Payer: Self-pay | Attending: Family Medicine | Admitting: Family Medicine

## 2023-03-05 DIAGNOSIS — I10 Essential (primary) hypertension: Secondary | ICD-10-CM

## 2023-03-05 MED ORDER — AMLODIPINE BESYLATE 10 MG PO TABS: 10.0000 mg | ORAL_TABLET | Freq: Every day | ORAL | 0 refills | Status: DC

## 2023-03-05 NOTE — Discharge Instructions (Signed)
Please start your amlodipine daily.  Please follow-up with your PCP at your scheduled appointment for further workup and evaluation of your blood pressure.  Take your blood pressure twice daily over the next 2 weeks and keep a log of this and take it to your new PCP for further treatment.  If you develop chest pain or shortness of breath please go to the emergency room.  I hope you feel better soon!

## 2023-03-05 NOTE — ED Provider Notes (Signed)
[facility]    CSN: [id number] Arrival date & time: 03/05/23  1018      History   Chief Complaint Chief Complaint  Patient presents with   Hypertension    HPI Mark Thornton is a 53 y.o. male resents for elevated BP.  Patient does have a history of hypertension as well as alcohol abuse.  States he was at the dentist this morning when they checked his blood pressure and it was high.  They referred him to urgent care for evaluation.  He states that he had been on amlodipine 10 mg daily for his blood pressure while he was in alcohol recovery.  He completed that recovery 4 months ago and did not follow-up with any PCP afterwards for refills of his medications and has been off the blood pressure meds for 4 months.  Does have a smoking history.,  Headaches, shortness of breath, dizziness, visual changes, syncope.  No other concerns at this time.   Hypertension    Past Medical History:  Diagnosis Date   Alcohol abuse    Hypertension     Patient Active Problem List   Diagnosis Date Noted   Elevated lipids 08/28/2021   Encounter to establish care 08/08/2021   Allergy 08/08/2021   Trigger middle finger of right hand 05/17/2021   Insomnia 05/17/2021   Cocaine abuse (HCC) 05/17/2021   BMI 29.0-29.9,adult 05/17/2021   Vitamin D deficiency 05/17/2021   Essential hypertension 04/17/2021   Substance induced mood disorder (HCC) 02/25/2021   Perirectal abscess 08/13/2020   Hyponatremia 08/13/2020   Hypokalemia 08/13/2020   Intermittent explosive disorder 12/11/2019   Psychosis, 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 RRIGATION AND DEBRIDEMENT PERIRECTAL ABSCESS;  Surgeon: Luretha Murphy, MD;  Location: [facility];  Service: General;  Laterality: N/A;       Home Medications    Prior to Admission medications    Medication Sig Start Date End Date Taking? Authorizing Provider  amLODipine (NORVASC) 10 MG tablet Take 1 tablet (10 mg total) by mouth daily. 03/05/23  Yes Radford Pax, NP  atorvastatin (LIPITOR) 10 MG tablet Take 1 tablet (10 mg total) by mouth daily. 08/26/22   Iloabachie, Chioma E, NP  cetirizine (ZYRTEC) 10 MG tablet Take 1 tablet (10 mg total) by mouth daily. 09/10/22   Iloabachie, Chioma E, NP  Cholecalciferol (VITAMIN D3 PO) Take 1 tablet by mouth daily.    [provider]  Multiple Vitamin (MULTIVITAMIN WITH MINERALS) TABS tablet Take 1 tablet by mouth daily. 03/01/21   Estella Husk, MD  nicotine (NICODERM CQ - DOSED IN MG/24 HOURS) 21 mg/24hr patch Place 1 patch (21 mg total) onto the skin daily. 03/01/21   Estella Husk, MD  Vitamin D, Ergocalciferol, (DRISDOL) 1.25 MG (50000 UNIT) CAPS capsule Take 1 capsule (50,000 Units total) by mouth every 7 (seven) days. 01/01/22   Iloabachie, Francoise Ceo, NP    Family History Family History  Problem Relation Age of Onset   Other Mother        unknown medical history   Other Father        unknown medical history   Hypertension Maternal Grandmother    Other Maternal Grandfather        unknown medical history   Other Paternal Grandmother        unknown medical history  Other Paternal Grandfather        unknown medical history    Social History Social History   Tobacco Use   Smoking status: Every Day    Current packs/day: 0.25    Types: Cigarettes   Smokeless tobacco: Never   Tobacco comments:    Smokes 6 cigarettes a day- has nicotine patches, which he uses sometimes    Pt resides at [facility]  Vaping Use   Vaping status: Never Used  Substance Use Topics   Alcohol use: Not Currently    Alcohol/week: 84.0 standard drinks of alcohol    Types: 84 Cans of beer per week    Comment: last use 05/21/2021   Drug use: Not Currently    Types: IV, Cocaine    Comment: last use 05/21/2021     Allergies   Patient has no  known allergies.   Review of Systems Review of Systems  Cardiovascular:        High BP     Physical Exam Triage Vital Signs ED Triage Vitals [03/05/23 1030]  Encounter Vitals Group     BP (!) 205/120     Systolic BP Percentile      Diastolic BP Percentile      Pulse Rate 82     Resp 16     Temp 97.8 F (36.6 C)     Temp Source Oral     SpO2 98 %     Weight      Height      Head Circumference      Peak Flow      Pain Score 0     Pain Loc      Pain Education      Exclude from Growth Chart    No data found.  Updated Vital Signs BP (!) 163/103 (BP Location: Right Arm)   Pulse 82   Temp 97.8 F (36.6 C) (Oral)   Resp 16   SpO2 98%   Visual Acuity Right Eye Distance:   Left Eye Distance:   Bilateral Distance:    Right Eye Near:   Left Eye Near:    Bilateral Near:     Physical Exam Vitals and nursing note reviewed.  Constitutional:      General: He is not in acute distress.    Appearance: Normal appearance. He is not ill-appearing.  HENT:     Head: Normocephalic and atraumatic.  Eyes:     Pupils: Pupils are equal, round, and reactive to light.  Cardiovascular:     Rate and Rhythm: Normal rate and regular rhythm.     Heart sounds: Normal heart sounds. No murmur heard. Pulmonary:     Effort: Pulmonary effort is normal.     Breath sounds: Normal breath sounds.  Skin:    General: Skin is warm and dry.  Neurological:     General: No focal deficit present.     Mental Status: He is alert and oriented to person, place, and time.  Psychiatric:        Mood and Affect: Mood normal.        Behavior: Behavior normal.      UC Treatments / Results  Labs (all labs ordered are listed, but only abnormal results are displayed) Labs Reviewed - No data to display  EKG   Radiology No results found.  Procedures ED EKG  Date/Time: 03/05/2023 10:59 AM  Performed by: Radford Pax, NP Authorized by: Radford Pax, NP   ECG interpreted by  ED Physician in  the absence of a cardiologist: no   Previous ECG:    Previous ECG:  Compared to current   Similarity:  No change Interpretation:    Interpretation: abnormal   Rate:    ECG rate:  79   ECG rate assessment: normal   Rhythm:    Rhythm: sinus rhythm   Ectopy:    Ectopy: none   ST segments:    ST segments:  Normal T waves:    T waves: inverted     Inverted:  V4, V5 and V6 Q waves:    Abnormal Q-waves: not present   Comments:     Sinus rhythm heart rate 79 with T wave abnormalities, similar to previous EKG from January 2023.  (including critical care time)  Medications Ordered in UC Medications - No data to display  Initial Impression / Assessment and Plan / UC Course  I have reviewed the triage vital signs and the nursing notes.  Pertinent labs & imaging results that were available during my care of the patient were reviewed by me and considered in my medical decision making (see chart for details).     Reviewed symptoms and exam with patient.  No red flags.  Blood pressure did improve on recheck but still remains elevated.  EKG is abnormal with T wave abnormalities but this is unchanged from his EKG from 2023.  He again denies any chest pain or shortness of breath.  Will restart amlodipine 10 mg daily.  He was advised to keep a BP log checking it twice daily for 2 weeks and take this to his PCP.  Nursing staff was able to establish patient with a PCP for follow-up.  Discussed DASH diet.  Strict ER precautions reviewed and patient verbalized understanding. Final Clinical Impressions(s) / UC Diagnoses   Final diagnoses:  Hypertension, unspecified type     Discharge Instructions      Please start your amlodipine daily.  Please follow-up with your PCP at your scheduled appointment for further workup and evaluation of your blood pressure.  Take your blood pressure twice daily over the next 2 weeks and keep a log of this and take it to your new PCP for further treatment.  If you  develop chest pain or shortness of breath please go to the emergency room.  I hope you feel better soon!     ED Prescriptions     Medication Sig Dispense Auth. Provider   amLODipine (NORVASC) 10 MG tablet Take 1 tablet (10 mg total) by mouth daily. 30 tablet Radford Pax, NP      PDMP not reviewed this encounter.   Radford Pax, NP 03/05/23 [phone]

## 2023-03-05 NOTE — ED Triage Notes (Signed)
Pt states he was at the dentist and his BP was high. States he has been off his BP medicine for the past 4 months. States he was taking amlodipine 10mg .

## 2023-04-02 ENCOUNTER — Encounter: Payer: Self-pay | Admitting: Nurse Practitioner

## 2023-04-02 ENCOUNTER — Ambulatory Visit (INDEPENDENT_AMBULATORY_CARE_PROVIDER_SITE_OTHER): Payer: Self-pay | Admitting: Nurse Practitioner

## 2023-04-02 VITALS — BP 141/98 | HR 91 | Temp 97.9°F | Wt 196.2 lb

## 2023-04-02 DIAGNOSIS — Z125 Encounter for screening for malignant neoplasm of prostate: Secondary | ICD-10-CM

## 2023-04-02 DIAGNOSIS — I10 Essential (primary) hypertension: Secondary | ICD-10-CM

## 2023-04-02 DIAGNOSIS — Z13220 Encounter for screening for lipoid disorders: Secondary | ICD-10-CM

## 2023-04-02 DIAGNOSIS — Z1329 Encounter for screening for other suspected endocrine disorder: Secondary | ICD-10-CM

## 2023-04-02 DIAGNOSIS — E559 Vitamin D deficiency, unspecified: Secondary | ICD-10-CM

## 2023-04-02 MED ORDER — AMLODIPINE BESYLATE 10 MG PO TABS: 10.0000 mg | ORAL_TABLET | Freq: Every day | ORAL | 0 refills | Status: DC

## 2023-04-02 NOTE — Progress Notes (Signed)
Subjective   Patient ID: Mark Thornton, male    DOB: May 31, 1970, 53 y.o.   MRN: [id number]  Chief Complaint  Patient presents with   Hypertension   Establish Care    Referring provider: No ref. provider found  Mark Thornton is a 53 y.o. male with Past Medical History: No date: Alcohol abuse No date: Hypertension   HPI  Patient presents today to establish care.  Patient does have a history of hypertension alcohol use disorder.  Patient does need a refill on his blood pressure medicine today. Denies f/c/s, n/v/d, hemoptysis, PND, leg swelling Denies chest pain or edema      No Known Allergies   There is no immunization history on file for this patient.  Tobacco History: Social History   Tobacco Use  Smoking Status Every Day   Current packs/day: 0.25   Types: Cigarettes  Smokeless Tobacco Never  Tobacco Comments   Smokes 6 cigarettes a day- has nicotine patches, which he uses sometimes   Pt resides at [facility]   Ready to quit: No Counseling given: Yes Tobacco comments: Smokes 6 cigarettes a day- has nicotine patches, which he uses sometimes Pt resides at [facility]   Outpatient Encounter Medications as of 04/02/2023  Medication Sig   Cholecalciferol (VITAMIN D3 PO) Take 1 tablet by mouth daily.   Multiple Vitamin (MULTIVITAMIN WITH MINERALS) TABS tablet Take 1 tablet by mouth daily.   Vitamin D, Ergocalciferol, (DRISDOL) 1.25 MG (50000 UNIT) CAPS capsule Take 1 capsule (50,000 Units total) by mouth every 7 (seven) days.   [DISCONTINUED] amLODipine (NORVASC) 10 MG tablet Take 1 tablet (10 mg total) by mouth daily.   amLODipine (NORVASC) 10 MG tablet Take 1 tablet (10 mg total) by mouth daily.   atorvastatin (LIPITOR) 10 MG tablet Take 1 tablet (10 mg total) by mouth daily. (Patient not taking: Reported on 04/02/2023)   cetirizine (ZYRTEC) 10 MG tablet Take 1 tablet (10 mg total) by mouth daily. (Patient not taking: Reported on 04/02/2023)   nicotine (NICODERM CQ -  DOSED IN MG/24 HOURS) 21 mg/24hr patch Place 1 patch (21 mg total) onto the skin daily. (Patient not taking: Reported on 04/02/2023)   No facility-administered encounter medications on file as of 04/02/2023.    Review of Systems  Review of Systems  Constitutional: Negative.   HENT: Negative.    Cardiovascular: Negative.   Gastrointestinal: Negative.   Allergic/Immunologic: Negative.   Neurological: Negative.   Psychiatric/Behavioral: Negative.       Objective:   BP (!) 141/98   Pulse 91   Temp 97.9 F (36.6 C)   Wt 196 lb 3.2 oz (89 kg)   SpO2 100%   BMI 28.15 kg/m   Wt Readings from Last 5 Encounters:  04/02/23 196 lb 3.2 oz (89 kg)  04/02/22 208 lb 6.4 oz (94.5 kg)  01/01/22 211 lb 12.8 oz (96.1 kg)  12/25/21 210 lb 8 oz (95.5 kg)  09/26/21 206 lb 14.4 oz (93.8 kg)     Physical Exam Vitals and nursing note reviewed.  Constitutional:      General: He is not in acute distress.    Appearance: He is well-developed.  Cardiovascular:     Rate and Rhythm: Normal rate and regular rhythm.  Pulmonary:     Effort: Pulmonary effort is normal.     Breath sounds: Normal breath sounds.  Skin:    General: Skin is warm and dry.  Neurological:     Mental Status: He is alert and  oriented to person, place, and time.       Assessment & Plan:   Prostate cancer screening -     PSA  Hypertension, unspecified type -     amLODIPine Besylate; Take 1 tablet (10 mg total) by mouth daily.  Dispense: 30 tablet; Refill: 0 -     CBC -     Comprehensive metabolic panel  Lipid screening -     Lipid panel  Thyroid disorder screen -     TSH  Vitamin D deficiency -     VITAMIN D 25 Hydroxy (Vit-D Deficiency, Fractures)     Return in about 3 months (around 06/30/2023).   Ivonne Andrew, NP 04/02/2023

## 2023-04-02 NOTE — Patient Instructions (Signed)
1. Hypertension, unspecified type  - amLODipine (NORVASC) 10 MG tablet; Take 1 tablet (10 mg total) by mouth daily.  Dispense: 30 tablet; Refill: 0 - CBC - Comprehensive metabolic panel  2. Prostate cancer screening (Primary)  - PSA  3. Lipid screening  - Lipid Panel  4. Thyroid disorder screen  - TSH  5. Vitamin D deficiency  - Vitamin D, 25-hydroxy

## 2023-04-09 ENCOUNTER — Other Ambulatory Visit: Payer: Self-pay

## 2023-04-16 ENCOUNTER — Other Ambulatory Visit: Payer: Self-pay

## 2023-04-17 ENCOUNTER — Other Ambulatory Visit: Payer: Self-pay

## 2023-04-17 ENCOUNTER — Other Ambulatory Visit: Payer: Self-pay | Admitting: Nurse Practitioner

## 2023-04-17 DIAGNOSIS — E785 Hyperlipidemia, unspecified: Secondary | ICD-10-CM

## 2023-04-17 DIAGNOSIS — E559 Vitamin D deficiency, unspecified: Secondary | ICD-10-CM

## 2023-04-17 LAB — COMPREHENSIVE METABOLIC PANEL
ALT: 17 [IU]/L (ref 0–44)
AST: 25 [IU]/L (ref 0–40)
Albumin: 4.6 g/dL (ref 3.8–4.9)
Alkaline Phosphatase: 76 [IU]/L (ref 44–121)
BUN/Creatinine Ratio: 11 (ref 9–20)
BUN: 10 mg/dL (ref 6–24)
Bilirubin Total: 0.3 mg/dL (ref 0.0–1.2)
CO2: 25 mmol/L (ref 20–29)
Calcium: 9.8 mg/dL (ref 8.7–10.2)
Chloride: 100 mmol/L (ref 96–106)
Creatinine, Ser: 0.87 mg/dL (ref 0.76–1.27)
Globulin, Total: 3.3 g/dL (ref 1.5–4.5)
Glucose: 75 mg/dL (ref 70–99)
Potassium: 4.4 mmol/L (ref 3.5–5.2)
Sodium: 140 mmol/L (ref 134–144)
Total Protein: 7.9 g/dL (ref 6.0–8.5)
eGFR: 103 mL/min/{1.73_m2} (ref >59)

## 2023-04-17 LAB — CBC
Hematocrit: 36.8 % — ABNORMAL LOW (ref 37.5–51.0)
Hemoglobin: 12.4 g/dL — ABNORMAL LOW (ref 13.0–17.7)
MCH: 28.2 pg (ref 26.6–33.0)
MCHC: 33.7 g/dL (ref 31.5–35.7)
MCV: 84 fL (ref 79–97)
Platelets: 394 10*3/uL (ref 150–450)
RBC: 4.39 x10E6/uL (ref 4.14–5.80)
RDW: 12.7 % (ref 11.6–15.4)
WBC: 5.6 10*3/uL (ref 3.4–10.8)

## 2023-04-17 LAB — TSH: TSH: 0.816 u[IU]/mL (ref 0.450–4.500)

## 2023-04-17 LAB — LIPID PANEL
Chol/HDL Ratio: 5 {ratio} (ref 0.0–5.0)
Cholesterol, Total: 210 mg/dL — ABNORMAL HIGH (ref 100–199)
HDL: 42 mg/dL (ref >39)
LDL Chol Calc (NIH): 145 mg/dL — ABNORMAL HIGH (ref 0–99)
Triglycerides: 126 mg/dL (ref 0–149)
VLDL Cholesterol Cal: 23 mg/dL (ref 5–40)

## 2023-04-17 LAB — VITAMIN D 25 HYDROXY (VIT D DEFICIENCY, FRACTURES): Vit D, 25-Hydroxy: 18.2 ng/mL — ABNORMAL LOW (ref 30.0–100.0)

## 2023-04-17 LAB — PSA: Prostate Specific Ag, Serum: 1.1 ng/mL (ref 0.0–4.0)

## 2023-04-17 MED ORDER — VITAMIN D (ERGOCALCIFEROL) 1.25 MG (50000 UNIT) PO CAPS
50000.0000 [IU] | ORAL_CAPSULE | ORAL | 0 refills | Status: DC
  Filled 2023-04-17: qty 12, 84d supply, fill #0

## 2023-04-17 MED ORDER — ATORVASTATIN CALCIUM 10 MG PO TABS
10.0000 mg | ORAL_TABLET | Freq: Every day | ORAL | 0 refills | Status: DC
Start: 2023-04-17 — End: 2023-04-27
  Filled 2023-04-17: qty 14, 14d supply, fill #0

## 2023-04-27 ENCOUNTER — Other Ambulatory Visit: Payer: Self-pay

## 2023-04-27 ENCOUNTER — Telehealth: Payer: Self-pay

## 2023-04-27 DIAGNOSIS — E559 Vitamin D deficiency, unspecified: Secondary | ICD-10-CM

## 2023-04-27 DIAGNOSIS — E785 Hyperlipidemia, unspecified: Secondary | ICD-10-CM

## 2023-04-27 MED ORDER — VITAMIN D (ERGOCALCIFEROL) 1.25 MG (50000 UNIT) PO CAPS: 50000.0000 [IU] | ORAL_CAPSULE | ORAL | 0 refills | Status: DC

## 2023-04-27 MED ORDER — ATORVASTATIN CALCIUM 10 MG PO TABS: 10.0000 mg | ORAL_TABLET | Freq: Every day | ORAL | 0 refills | Status: DC

## 2023-04-27 MED ORDER — ATORVASTATIN CALCIUM 10 MG PO TABS
10.0000 mg | ORAL_TABLET | Freq: Every day | ORAL | 0 refills | Status: DC
  Filled 2023-04-27: qty 14, 14d supply, fill #0

## 2023-04-27 MED ORDER — VITAMIN D (ERGOCALCIFEROL) 1.25 MG (50000 UNIT) PO CAPS
50000.0000 [IU] | ORAL_CAPSULE | ORAL | 0 refills | Status: DC
  Filled 2023-04-27: qty 12, 84d supply, fill #0

## 2023-04-27 NOTE — Telephone Encounter (Signed)
 Copied from CRM [id number]. Topic: General - Other >> Apr 27, 2023 12:51 PM Emylou G wrote: Reason for CRM: Patient calling checking status of medication from being seen.. [facility]... medication from visit: Vitamin D, Ergocalciferol, (DRISDOL) 1.25 MG (50000 UNIT) CAPS capsule and atorvastatin (LIPITOR) 10 MG tablet >> Apr 27, 2023  1:11 PM CMA Carollee Herter L wrote: Medication sent

## 2023-04-28 ENCOUNTER — Telehealth: Payer: Self-pay

## 2023-04-28 NOTE — Telephone Encounter (Signed)
 Copied from CRM [id number]. Topic: Clinical - Medication Question >> Apr 27, 2023  5:14 PM DeAngela L wrote: Reason for CRM: Patient wants to ask why his refill for the cholesterol pills is a 14 day supply and he would like to ask why its a small refill.

## 2023-04-29 ENCOUNTER — Other Ambulatory Visit: Payer: Self-pay | Admitting: Nurse Practitioner

## 2023-04-29 DIAGNOSIS — I10 Essential (primary) hypertension: Secondary | ICD-10-CM

## 2023-04-29 DIAGNOSIS — E785 Hyperlipidemia, unspecified: Secondary | ICD-10-CM

## 2023-04-29 MED ORDER — AMLODIPINE BESYLATE 10 MG PO TABS
10.0000 mg | ORAL_TABLET | Freq: Every day | ORAL | 0 refills | Status: DC
Start: 2023-04-29 — End: 2023-05-29

## 2023-04-29 MED ORDER — ATORVASTATIN CALCIUM 10 MG PO TABS: 10.0000 mg | ORAL_TABLET | Freq: Every day | ORAL | 1 refills | Status: DC

## 2023-04-29 NOTE — Telephone Encounter (Signed)
 Copied from CRM [id number]. Topic: Clinical - Medication Refill >> Apr 29, 2023  2:54 PM Emylou G wrote: Most Recent Primary Care Visit:  Provider: [facility]  Department: [facility]  Visit Type: LAB  Date: 04/16/2023  Medication: amLODipine (NORVASC) 10 MG tablet  Has the patient contacted their pharmacy? No (Agent: If no, request that the patient contact the pharmacy for the refill. If patient does not wish to contact the pharmacy document the reason why and proceed with request.) (Agent: If yes, when and what did the pharmacy advise?)  Is this the correct pharmacy for this prescription? Yes If no, delete pharmacy and type the correct one.  This is the patient's preferred pharmacy:  [facility] - [place], [place] - [address] [place] [address] Phone: [phone] Fax: [phone]   Has the prescription been filled recently? No  Is the patient out of the medication? Yes 7 left  Has the patient been seen for an appointment in the last year OR does the patient have an upcoming appointment? Yes  Can we respond through MyChart? No  Agent: Please be advised that Rx refills may take up to 3 business days. We ask that you follow-up with your pharmacy.

## 2023-04-29 NOTE — Telephone Encounter (Signed)
 Copied from CRM [id number]. Topic: Clinical - Medication Refill >> Apr 29, 2023  2:51 PM Emylou G wrote: Most Recent Primary Care Visit:  Provider: [facility]  Department[facility]  Visit Type: LAB  Date: 04/16/2023  Medication: amLODipine (NORVASC) 10 MG tablet  Has the patient contacted their pharmacy? No (Agent: If no, request that the patient contact the pharmacy for the refill. If patient does not wish to contact the pharmacy document the reason why and proceed with request.) (Agent: If yes, when and what did the pharmacy advise?)  Is this the correct pharmacy for this prescription? Yes If no, delete pharmacy and type the correct one.  This is the patient's preferred pharmacy:  [facility] - [place], [place] - [address] [place] [address] Phone: [phone] Fax: [phone]   Has the prescription been filled recently? No  Is the patient out of the medication? Yes 7 left  Has the patient been seen for an appointment in the last year OR does the patient have an upcoming appointment? Yes  Can we respond through MyChart? No  Agent: Please be advised that Rx refills may take up to 3 business days. We ask that you follow-up with your pharmacy.

## 2023-05-29 ENCOUNTER — Other Ambulatory Visit: Payer: Self-pay | Admitting: Nurse Practitioner

## 2023-05-29 DIAGNOSIS — I10 Essential (primary) hypertension: Secondary | ICD-10-CM

## 2023-07-02 ENCOUNTER — Encounter: Payer: Self-pay | Admitting: Nurse Practitioner

## 2023-07-02 ENCOUNTER — Ambulatory Visit (INDEPENDENT_AMBULATORY_CARE_PROVIDER_SITE_OTHER): Payer: Self-pay | Admitting: Nurse Practitioner

## 2023-07-02 VITALS — BP 133/89 | HR 77 | Temp 97.9°F | Wt 198.2 lb

## 2023-07-02 DIAGNOSIS — E559 Vitamin D deficiency, unspecified: Secondary | ICD-10-CM

## 2023-07-02 DIAGNOSIS — I10 Essential (primary) hypertension: Secondary | ICD-10-CM

## 2023-07-02 MED ORDER — AMLODIPINE BESYLATE 10 MG PO TABS: 10.0000 mg | ORAL_TABLET | Freq: Every day | ORAL | 0 refills | Status: DC

## 2023-07-02 NOTE — Progress Notes (Signed)
 Subjective   Patient ID: Mark Thornton, male    DOB: Apr 07, 1970, 53 y.o.   MRN: [id number]  Chief Complaint  Patient presents with   Medical Management of Chronic Issues    Patient stated that he is gaining weight     Referring provider: No ref. provider found  Christafer Mcclurg is a 53 y.o. male with Past Medical History: No date: Alcohol abuse No date: Hypertension No date: Substance abuse (HCC)   HPI  Patient presents today for follow-up visit.  Patient does have a history of hypertension and alcohol use disorder.  Overall patient has been doing well.  He does need a refill on blood pressure medicine.  He states that he has been taking multivitamin daily but did not take vitamin D  supplement.  We will recheck vitamin D  level today. Denies f/c/s, n/v/d, hemoptysis, PND, leg swelling Denies chest pain or edema    No Known Allergies   There is no immunization history on file for this patient.  Tobacco History: Social History   Tobacco Use  Smoking Status Every Day   Current packs/day: 0.25   Types: Cigarettes  Smokeless Tobacco Never  Tobacco Comments   Smokes 6 cigarettes a day- has nicotine  patches, which he uses sometimes   Pt resides at [facility]   Ready to quit: No Counseling given: Yes Tobacco comments: Smokes 6 cigarettes a day- has nicotine  patches, which he uses sometimes Pt resides at [facility]   Outpatient Encounter Medications as of 07/02/2023  Medication Sig   atorvastatin  (LIPITOR) 10 MG tablet Take 1 tablet (10 mg total) by mouth daily.   Multiple Vitamin (MULTIVITAMIN WITH MINERALS) TABS tablet Take 1 tablet by mouth daily.   [DISCONTINUED] amLODipine  (NORVASC ) 10 MG tablet TAKE 1 TABLET BY MOUTH DAILY   amLODipine  (NORVASC ) 10 MG tablet Take 1 tablet (10 mg total) by mouth daily.   cetirizine  (ZYRTEC ) 10 MG tablet Take 1 tablet (10 mg total) by mouth daily. (Patient not taking: Reported on 04/02/2023)   Cholecalciferol (VITAMIN D3 PO) Take 1 tablet  by mouth daily. (Patient not taking: Reported on 07/02/2023)   nicotine  (NICODERM CQ  - DOSED IN MG/24 HOURS) 21 mg/24hr patch Place 1 patch (21 mg total) onto the skin daily. (Patient not taking: Reported on 04/02/2023)   Vitamin D , Ergocalciferol , (DRISDOL ) 1.25 MG (50000 UNIT) CAPS capsule Take 1 capsule (50,000 Units total) by mouth every 7 (seven) days. (Patient not taking: Reported on 07/02/2023)   No facility-administered encounter medications on file as of 07/02/2023.    Review of Systems  Review of Systems  Constitutional: Negative.   HENT: Negative.    Cardiovascular: Negative.   Gastrointestinal: Negative.   Allergic/Immunologic: Negative.   Neurological: Negative.   Psychiatric/Behavioral: Negative.       Objective:   BP 133/89   Pulse 77   Temp 97.9 F (36.6 C) (Oral)   Wt 198 lb 3.2 oz (89.9 kg)   SpO2 100%   BMI 28.44 kg/m   Wt Readings from Last 5 Encounters:  07/02/23 198 lb 3.2 oz (89.9 kg)  04/02/23 196 lb 3.2 oz (89 kg)  04/02/22 208 lb 6.4 oz (94.5 kg)  01/01/22 211 lb 12.8 oz (96.1 kg)  12/25/21 210 lb 8 oz (95.5 kg)     Physical Exam Vitals and nursing note reviewed.  Constitutional:      General: He is not in acute distress.    Appearance: He is well-developed.  Cardiovascular:     Rate and Rhythm:  Normal rate and regular rhythm.  Pulmonary:     Effort: Pulmonary effort is normal.     Breath sounds: Normal breath sounds.  Skin:    General: Skin is warm and dry.  Neurological:     Mental Status: He is alert and oriented to person, place, and time.       Assessment & Plan:   Vitamin D  deficiency -     VITAMIN D  25 Hydroxy (Vit-D Deficiency, Fractures)  Hypertension, unspecified type -     amLODIPine  Besylate; Take 1 tablet (10 mg total) by mouth daily.  Dispense: 30 tablet; Refill: 0     Return in about 6 months (around 01/02/2024).   Jerrlyn Morel, NP 07/02/2023

## 2023-07-02 NOTE — Patient Instructions (Signed)
 Hypertension, Adult Hypertension is another name for high blood pressure. High blood pressure forces your heart to work harder to pump blood. This can cause problems over time. There are two numbers in a blood pressure reading. There is a top number (systolic) over a bottom number (diastolic). It is best to have a blood pressure that is below 120/80. What are the causes? The cause of this condition is not known. Some other conditions can lead to high blood pressure. What increases the risk? Some lifestyle factors can make you more likely to develop high blood pressure: Smoking. Not getting enough exercise or physical activity. Being overweight. Having too much fat, sugar, calories, or salt (sodium) in your diet. Drinking too much alcohol. Other risk factors include: Having any of these conditions: Heart disease. Diabetes. High cholesterol. Kidney disease. Obstructive sleep apnea. Having a family history of high blood pressure and high cholesterol. Age. The risk increases with age. Stress. What are the signs or symptoms? High blood pressure may not cause symptoms. Very high blood pressure (hypertensive crisis) may cause: Headache. Fast or uneven heartbeats (palpitations). Shortness of breath. Nosebleed. Vomiting or feeling like you may vomit (nauseous). Changes in how you see. Very bad chest pain. Feeling dizzy. Seizures. How is this treated? This condition is treated by making healthy lifestyle changes, such as: Eating healthy foods. Exercising more. Drinking less alcohol. Your doctor may prescribe medicine if lifestyle changes do not help enough and if: Your top number is above 130. Your bottom number is above 80. Your personal target blood pressure may vary. Follow these instructions at home: Eating and drinking  If told, follow the DASH eating plan. To follow this plan: Fill one half of your plate at each meal with fruits and vegetables. Fill one fourth of your plate  at each meal with whole grains. Whole grains include whole-wheat pasta, brown rice, and whole-grain bread. Eat or drink low-fat dairy products, such as skim milk or low-fat yogurt. Fill one fourth of your plate at each meal with low-fat (lean) proteins. Low-fat proteins include fish, chicken without skin, eggs, beans, and tofu. Avoid fatty meat, cured and processed meat, or chicken with skin. Avoid pre-made or processed food. Limit the amount of salt in your diet to less than 1,500 mg each day. Do not drink alcohol if: Your doctor tells you not to drink. You are pregnant, may be pregnant, or are planning to become pregnant. If you drink alcohol: Limit how much you have to: 0-1 drink a day for women. 0-2 drinks a day for men. Know how much alcohol is in your drink. In the [place] one drink equals one 12 oz bottle of beer (355 mL), one 5 oz glass of wine (148 mL), or one 1 oz glass of hard liquor (44 mL). Lifestyle  Work with your doctor to stay at a healthy weight or to lose weight. Ask your doctor what the best weight is for you. Get at least 30 minutes of exercise that causes your heart to beat faster (aerobic exercise) most days of the week. This may include walking, swimming, or biking. Get at least 30 minutes of exercise that strengthens your muscles (resistance exercise) at least 3 days a week. This may include lifting weights or doing Pilates. Do not smoke or use any products that contain nicotine or tobacco. If you need help quitting, ask your doctor. Check your blood pressure at home as told by your doctor. Keep all follow-up visits. Medicines Take over-the-counter and prescription medicines  only as told by your doctor. Follow directions carefully. Do not skip doses of blood pressure medicine. The medicine does not work as well if you skip doses. Skipping doses also puts you at risk for problems. Ask your doctor about side effects or reactions to medicines that you should watch  for. Contact a doctor if: You think you are having a reaction to the medicine you are taking. You have headaches that keep coming back. You feel dizzy. You have swelling in your ankles. You have trouble with your vision. Get help right away if: You get a very bad headache. You start to feel mixed up (confused). You feel weak or numb. You feel faint. You have very bad pain in your: Chest. Belly (abdomen). You vomit more than once. You have trouble breathing. These symptoms may be an emergency. Get help right away. Call 911. Do not wait to see if the symptoms will go away. Do not drive yourself to the hospital. Summary Hypertension is another name for high blood pressure. High blood pressure forces your heart to work harder to pump blood. For most people, a normal blood pressure is less than 120/80. Making healthy choices can help lower blood pressure. If your blood pressure does not get lower with healthy choices, you may need to take medicine. This information is not intended to replace advice given to you by your health care provider. Make sure you discuss any questions you have with your health care provider. Document Revised: 11/22/2020 Document Reviewed: 11/22/2020 Elsevier Patient Education  2024 [employer].

## 2023-07-03 ENCOUNTER — Other Ambulatory Visit: Payer: Self-pay | Admitting: Nurse Practitioner

## 2023-07-03 ENCOUNTER — Ambulatory Visit: Payer: Self-pay | Admitting: Nurse Practitioner

## 2023-07-03 DIAGNOSIS — E559 Vitamin D deficiency, unspecified: Secondary | ICD-10-CM

## 2023-07-03 LAB — VITAMIN D 25 HYDROXY (VIT D DEFICIENCY, FRACTURES): Vit D, 25-Hydroxy: 23.3 ng/mL — ABNORMAL LOW (ref 30.0–100.0)

## 2023-07-03 MED ORDER — VITAMIN D (ERGOCALCIFEROL) 1.25 MG (50000 UNIT) PO CAPS: 50000.0000 [IU] | ORAL_CAPSULE | ORAL | 0 refills | Status: AC

## 2023-08-10 ENCOUNTER — Other Ambulatory Visit: Payer: Self-pay | Admitting: Nurse Practitioner

## 2023-08-10 DIAGNOSIS — I10 Essential (primary) hypertension: Secondary | ICD-10-CM

## 2023-08-10 DIAGNOSIS — E785 Hyperlipidemia, unspecified: Secondary | ICD-10-CM

## 2023-08-10 MED ORDER — AMLODIPINE BESYLATE 10 MG PO TABS: 10.0000 mg | ORAL_TABLET | Freq: Every day | ORAL | 0 refills | Status: DC

## 2023-08-10 MED ORDER — ATORVASTATIN CALCIUM 10 MG PO TABS
10.0000 mg | ORAL_TABLET | Freq: Every day | ORAL | 1 refills | Status: DC
Start: 2023-08-10 — End: 2023-11-16

## 2023-08-10 NOTE — Telephone Encounter (Signed)
 Copied from CRM [id number]. Topic: Clinical - Medication Refill >> Aug 10, 2023  1:56 PM Corin V wrote: Medication: atorvastatin  (LIPITOR) 10 MG tablet amLODipine  (NORVASC ) 10 MG tablet  Has the patient contacted their pharmacy? Yes (Agent: If no, request that the patient contact the pharmacy for the refill. If patient does not wish to contact the pharmacy document the reason why and proceed with request.) (Agent: If yes, when and what did the pharmacy advise?)  This is the patient's preferred pharmacy:  [facility] - [place], [place] - [address] [place] [address] Phone: [phone] Fax: [phone]  Is this the correct pharmacy for this prescription? Yes If no, delete pharmacy and type the correct one.   Has the prescription been filled recently? No  Is the patient out of the medication? No- only has 2 left of each  Has the patient been seen for an appointment in the last year OR does the patient have an upcoming appointment? Yes  Can we respond through MyChart? No  Agent: Please be advised that Rx refills may take up to 3 business days. We ask that you follow-up with your pharmacy.

## 2023-08-19 ENCOUNTER — Ambulatory Visit
Admission: EM | Admit: 2023-08-19 | Discharge: 2023-08-19 | Disposition: A | Discharge status: Home / Self Care | Payer: Self-pay | Attending: Family Medicine | Admitting: Family Medicine

## 2023-08-19 DIAGNOSIS — S71001A Unspecified open wound, right hip, initial encounter: Secondary | ICD-10-CM | POA: Insufficient documentation

## 2023-08-19 MED ORDER — DOXYCYCLINE HYCLATE 100 MG PO CAPS: 100.0000 mg | ORAL_CAPSULE | Freq: Two times a day (BID) | ORAL | 0 refills | Status: AC

## 2023-08-19 NOTE — Discharge Instructions (Addendum)
 The clinic will contact you with results of the wound culture done today if positive.  Start doxycycline twice daily for 10 days.  Keep the wound clean and dry.  Please follow-up with your PCP ideally in 2 to 3 days or soon as possible for recheck.  Please go to the ER if you develop any worsening symptoms.  This includes but is not limited to fevers, worsening redness or drainage from wound, or any new concerns that arise.  I feel better soon!

## 2023-08-19 NOTE — ED Triage Notes (Addendum)
 Pt present with a possible infection to the rt side of the wasiteline. Pt states he thinks he might have had an ingrown hair that got bigger. He was treating it at home and then noticed the wound was getting bigger.   Pt states he feels irritation and pain.

## 2023-08-19 NOTE — ED Provider Notes (Signed)
 [facility]    CSN: [id number] Arrival date & time: 08/19/23  1614      History   Chief Complaint Chief Complaint  Patient presents with   Insect Bite    HPI Mark Thornton is a 53 y.o. male presents for leg wound.  Patient reports about 5 days ago he had a palpable every thought was a bug bite on his right hip.  He states it popped on its own and continue to.  He does endorse some drainage.  He denies fevers and chills to the area.  No history of MRSA.  He has tried Neosporin to the area without improvement.  No other concerning with time.  HPI  Past Medical History:  Diagnosis Date   Alcohol abuse    Hypertension    Substance abuse [facility])     Patient Active Problem List   Diagnosis Date Noted   Elevated lipids 08/28/2021   Encounter to establish care 08/08/2021   Allergy 08/08/2021   Trigger middle finger of right hand 05/17/2021   Insomnia 05/17/2021   Cocaine abuse (HCC) 05/17/2021   BMI 29.0-29.9,adult 05/17/2021   Vitamin D  deficiency 05/17/2021   Essential hypertension 04/17/2021   Substance induced mood disorder (HCC) 02/25/2021   Perirectal abscess 08/13/2020   Hyponatremia 08/13/2020   Hypokalemia 08/13/2020   Intermittent explosive disorder 12/11/2019   Psychosis, 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 RRIGATION AND DEBRIDEMENT PERIRECTAL ABSCESS;  Surgeon: Gladis Cough, MD;  Location: [facility];  Service: General;  Laterality: N/A;       Home Medications    Prior to Admission medications   Medication Sig Start Date End Date Taking? Authorizing Provider  doxycycline (VIBRAMYCIN) 100 MG capsule Take 1 capsule (100 mg total) by mouth 2 (two) times daily. 08/19/23  Yes Cinque Begley, Jodi R, NP  amLODipine  (NORVASC ) 10 MG tablet Take 1 tablet (10 mg total) by mouth daily. 08/10/23   Oley Bascom RAMAN,  NP  atorvastatin  (LIPITOR) 10 MG tablet Take 1 tablet (10 mg total) by mouth daily. 08/10/23   Oley Bascom RAMAN, NP  cetirizine  (ZYRTEC ) 10 MG tablet Take 1 tablet (10 mg total) by mouth daily. Patient not taking: Reported on 04/02/2023 09/10/22   Iloabachie, Chioma E, NP  Cholecalciferol (VITAMIN D3 PO) Take 1 tablet by mouth daily. Patient not taking: Reported on 07/02/2023    [provider]  Multiple Vitamin (MULTIVITAMIN WITH MINERALS) TABS tablet Take 1 tablet by mouth daily. 03/01/21   Hansel Comer RAMAN, MD  nicotine  (NICODERM CQ  - DOSED IN MG/24 HOURS) 21 mg/24hr patch Place 1 patch (21 mg total) onto the skin daily. Patient not taking: Reported on 04/02/2023 03/01/21   Hansel Comer RAMAN, MD  Vitamin D , Ergocalciferol , (DRISDOL ) 1.25 MG (50000 UNIT) CAPS capsule Take 1 capsule (50,000 Units total) by mouth every 7 (seven) days. 07/03/23   Oley Bascom RAMAN, NP    Family History Family History  Problem Relation Age of Onset   Other Mother        unknown medical history   Other Father        unknown medical history   Hypertension Maternal Grandmother    Other Maternal Grandfather        unknown medical history   Other Paternal Grandmother        unknown medical history   Other  Paternal Grandfather        unknown medical history    Social History Social History   Tobacco Use   Smoking status: Every Day    Current packs/day: 0.25    Types: Cigarettes   Smokeless tobacco: Never   Tobacco comments:    Smokes 6 cigarettes a day- has nicotine  patches, which he uses sometimes    Pt resides at [facility]  Vaping Use   Vaping status: Never Used  Substance Use Topics   Alcohol use: Not Currently    Alcohol/week: 84.0 standard drinks of alcohol    Types: 84 Cans of beer per week    Comment: last use 05/21/2021   Drug use: Not Currently    Types: IV, Cocaine, Crack cocaine    Comment: last use 05/21/2021     Allergies   Patient has no known allergies.   Review of  Systems Review of Systems  Skin:  Positive for wound.     Physical Exam Triage Vital Signs ED Triage Vitals  Encounter Vitals Group     BP 08/19/23 1637 (!) 140/90     Girls Systolic BP Percentile --      Girls Diastolic BP Percentile --      Boys Systolic BP Percentile --      Boys Diastolic BP Percentile --      Pulse Rate 08/19/23 1637 74     Resp 08/19/23 1637 15     Temp 08/19/23 1637 98.5 F (36.9 C)     Temp Source 08/19/23 1637 Oral     SpO2 08/19/23 1637 95 %     Weight --      Height --      Head Circumference --      Peak Flow --      Pain Score 08/19/23 1636 7     Pain Loc --      Pain Education --      Exclude from Growth Chart --    No data found.  Updated Vital Signs BP (!) 140/90 (BP Location: Left Arm)   Pulse 74   Temp 98.5 F (36.9 C) (Oral)   Resp 15   SpO2 95%   Visual Acuity Right Eye Distance:   Left Eye Distance:   Bilateral Distance:    Right Eye Near:   Left Eye Near:    Bilateral Near:     Physical Exam Vitals and nursing note reviewed.  Constitutional:      General: He is not in acute distress.    Appearance: Normal appearance. He is not ill-appearing.  HENT:     Head: Normocephalic and atraumatic.  Eyes:     Pupils: Pupils are equal, round, and reactive to light.  Cardiovascular:     Rate and Rhythm: Normal rate.  Pulmonary:     Effort: Pulmonary effort is normal.  Abdominal:      Comments: 3 cm laceration to the right hip.  No active drainage.  Mild induration surrounding.  See photo  Skin:    General: Skin is warm and dry.  Neurological:     General: No focal deficit present.     Mental Status: He is alert and oriented to person, place, and time.  Psychiatric:        Mood and Affect: Mood normal.        Behavior: Behavior normal.      UC Treatments / Results  Labs (all labs ordered are listed, but only abnormal results are displayed)  Labs Reviewed  AEROBIC CULTURE W GRAM STAIN (SUPERFICIAL SPECIMEN)    Comprehensive metabolic panel Order: [id number]  Status: Final result     Next appt: 10/01/2023 at 08:40 AM in Internal Medicine Vernia GORMAN Borer, NP)     Dx: Hypertension, unspecified type   Test Result Released: No (inaccessible in MyChart)   3 Result Notes          Component Ref Range & Units (hover) 4 mo ago (04/16/23) 2 yr ago (05/16/21) 2 yr ago (02/24/21) 2 yr ago (02/24/21) 2 yr ago (12/18/20) 2 yr ago (11/25/20) 3 yr ago (08/15/20)  Glucose 75 93 98 CM 125 High  CM 158 High  CM 120 High  CM 152 High  CM  BUN 10 12 20  R 20 R 11 R 6 R 9 R  Creatinine, Ser 0.87 0.88 0.86 R 0.83 R 0.76 R 0.76 R 0.76 R  eGFR 103 104       BUN/Creatinine Ratio 11 14       Sodium 140 140 136 R 137 R 134 Low  R 138 R 132 Low  R  Potassium 4.4 4.0 4.5 R, CM 3.4 Low  R 3.4 Low  R 3.6 R 4.2 R  Chloride 100 96 97 Low  R 99 R 94 Low  R 105 R 97 Low  R  CO2 25  28 R 27 R 30 R 24 R 28 R  Calcium  9.8 9.8 9.7 R 9.4 R 9.6 R 8.2 Low  R 8.7 Low  R  Total Protein 7.9 7.7 9.4 High  R 9.2 High  R 8.4 High  R 8.2 High  R   Albumin 4.6 4.7 4.8 R 4.8 R 4.0 R 3.9 R   Globulin, Total 3.3 3.0       Bilirubin Total 0.3 0.6 1.7 High  R 1.5 High  R 1.0 R 0.6 R   Alkaline Phosphatase 76 65 60 R 61 R 61 R 63 R   AST 25 22 31  R 31 R 209 High  R 24 R   ALT 17  21 R 21 R 104 High  R 15 R   Resulting Agency [facility]         Narrative Performed by: [facility] Performed at:  [address] [place], [place]  [id number] Lab Director: Frankey Sas MD, Phone:  [phone]  Specimen Collected: 04/16/23 13:11 Last Resulted: 04/17/23 05:36   EKG   Radiology No results found.  Procedures Procedures (including critical care time)  Medications Ordered in UC Medications - No data to display  Initial Impression / Assessment and Plan / UC Course  I have reviewed the triage vital signs and the nursing notes.  Pertinent labs & imaging  results that were available during my care of the patient were reviewed by me and considered in my medical decision making (see chart for details).     Reviewed exam and symptoms with patient.  He is afebrile and clinically well-appearing.  Wound culture obtained and will start doxycycline twice daily for 10 days.  Wound was cleansed and dressed by nursing staff.  Patient to take Tylenol  and ibuprofen OTC as needed for pain.  Advised to follow-up with his PCP in 2 to 3 days for recheck.  Strict ER precautions reviewed and patient verbalized understand Final Clinical Impressions(s) / UC Diagnoses   Final diagnoses:  Open wound  of right hip, initial encounter     Discharge Instructions      The clinic will contact you with results of the wound culture done today if positive.  Start doxycycline twice daily for 10 days.  Keep the wound clean and dry.  Please follow-up with your PCP ideally in 2 to 3 days or soon as possible for recheck.  Please go to the ER if you develop any worsening symptoms.  This includes but is not limited to fevers, worsening redness or drainage from wound, or any new concerns that arise.  I feel better soon!     ED Prescriptions     Medication Sig Dispense Auth. Provider   doxycycline (VIBRAMYCIN) 100 MG capsule Take 1 capsule (100 mg total) by mouth 2 (two) times daily. 20 capsule Jirah Rider, Jodi R, NP      PDMP not reviewed this encounter.   Loreda Myla SAUNDERS, NP 08/19/23 [phone]

## 2023-08-22 LAB — AEROBIC CULTURE W GRAM STAIN (SUPERFICIAL SPECIMEN)

## 2023-08-24 ENCOUNTER — Ambulatory Visit (HOSPITAL_COMMUNITY): Payer: Self-pay

## 2023-09-04 ENCOUNTER — Ambulatory Visit (INDEPENDENT_AMBULATORY_CARE_PROVIDER_SITE_OTHER): Payer: Self-pay | Admitting: Nurse Practitioner

## 2023-09-04 ENCOUNTER — Encounter: Payer: Self-pay | Admitting: Nurse Practitioner

## 2023-09-04 VITALS — BP 148/81 | HR 72 | Temp 97.9°F | Wt 198.2 lb

## 2023-09-04 DIAGNOSIS — E559 Vitamin D deficiency, unspecified: Secondary | ICD-10-CM

## 2023-09-04 DIAGNOSIS — I10 Essential (primary) hypertension: Secondary | ICD-10-CM

## 2023-09-04 MED ORDER — AMLODIPINE BESYLATE 10 MG PO TABS: 10.0000 mg | ORAL_TABLET | Freq: Every day | ORAL | 0 refills | Status: DC

## 2023-09-04 NOTE — Progress Notes (Signed)
 Subjective   Patient ID: Mark Thornton, male    DOB: Dec 09, 1970, 53 y.o.   MRN: [id number]  Chief Complaint  Patient presents with   Hospitalization Follow-up    Patient had a bite, but it is healing     Referring provider: Oley Mark RAMAN, NP  Mark Thornton is a 53 y.o. male with Past Medical History: No date: Alcohol abuse No date: Hypertension No date: Substance abuse [facility]   HPI  Patient presents today for a urgent care follow-up.  He was seen in the urgent care recently for an insect bite to his right thigh.  He was treated with doxycycline  and the area is well-healed.  He has been keeping the area clean and dry.  He is requested a recheck of his vitamin D  today. Denies f/c/s, n/v/d, hemoptysis, PND, leg swelling Denies chest pain or edema     No Known Allergies   There is no immunization history on file for this patient.  Tobacco History: Social History   Tobacco Use  Smoking Status Every Day   Current packs/day: 0.25   Types: Cigarettes  Smokeless Tobacco Never  Tobacco Comments   Smokes 6 cigarettes a day- has nicotine  patches, which he uses sometimes   Pt resides at [facility]   Ready to quit: Not Answered Counseling given: Yes Tobacco comments: Smokes 6 cigarettes a day- has nicotine  patches, which he uses sometimes Pt resides at [facility]   Outpatient Encounter Medications as of 09/04/2023  Medication Sig   atorvastatin  (LIPITOR) 10 MG tablet Take 1 tablet (10 mg total) by mouth daily.   Multiple Vitamin (MULTIVITAMIN WITH MINERALS) TABS tablet Take 1 tablet by mouth daily.   Vitamin D , Ergocalciferol , (DRISDOL ) 1.25 MG (50000 UNIT) CAPS capsule Take 1 capsule (50,000 Units total) by mouth every 7 (seven) days.   [DISCONTINUED] amLODipine  (NORVASC ) 10 MG tablet Take 1 tablet (10 mg total) by mouth daily.   amLODipine  (NORVASC ) 10 MG tablet Take 1 tablet (10 mg total) by mouth daily.   cetirizine  (ZYRTEC ) 10 MG tablet Take 1 tablet (10 mg total) by  mouth daily. (Patient not taking: Reported on 04/02/2023)   Cholecalciferol (VITAMIN D3 PO) Take 1 tablet by mouth daily. (Patient not taking: Reported on 07/02/2023)   doxycycline  (VIBRAMYCIN ) 100 MG capsule Take 1 capsule (100 mg total) by mouth 2 (two) times daily. (Patient not taking: Reported on 09/04/2023)   nicotine  (NICODERM CQ  - DOSED IN MG/24 HOURS) 21 mg/24hr patch Place 1 patch (21 mg total) onto the skin daily. (Patient not taking: Reported on 04/02/2023)   No facility-administered encounter medications on file as of 09/04/2023.    Review of Systems  Review of Systems  Constitutional: Negative.   HENT: Negative.    Cardiovascular: Negative.   Gastrointestinal: Negative.   Allergic/Immunologic: Negative.   Neurological: Negative.   Psychiatric/Behavioral: Negative.       Objective:   BP (!) 148/81   Pulse 72   Temp 97.9 F (36.6 C) (Oral)   Wt 198 lb 3.2 oz (89.9 kg)   SpO2 98%   BMI 28.44 kg/m   Wt Readings from Last 5 Encounters:  09/04/23 198 lb 3.2 oz (89.9 kg)  07/02/23 198 lb 3.2 oz (89.9 kg)  04/02/23 196 lb 3.2 oz (89 kg)  04/02/22 208 lb 6.4 oz (94.5 kg)  01/01/22 211 lb 12.8 oz (96.1 kg)     Physical Exam Vitals and nursing note reviewed.  Constitutional:      General: He is  not in acute distress.    Appearance: He is well-developed.  Cardiovascular:     Rate and Rhythm: Normal rate and regular rhythm.  Pulmonary:     Effort: Pulmonary effort is normal.     Breath sounds: Normal breath sounds.  Skin:    General: Skin is warm and dry.  Neurological:     Mental Status: He is alert and oriented to person, place, and time.       Assessment & Plan:   Vitamin D  deficiency -     VITAMIN D  25 Hydroxy (Vit-D Deficiency, Fractures)  Hypertension, unspecified type -     amLODIPine  Besylate; Take 1 tablet (10 mg total) by mouth daily.  Dispense: 30 tablet; Refill: 0     Return if symptoms worsen or fail to improve.   Mark GORMAN Borer,  NP 09/04/2023

## 2023-09-05 LAB — VITAMIN D 25 HYDROXY (VIT D DEFICIENCY, FRACTURES): Vit D, 25-Hydroxy: 45.1 ng/mL (ref 30.0–100.0)

## 2023-09-07 ENCOUNTER — Telehealth: Payer: Self-pay

## 2023-09-07 ENCOUNTER — Ambulatory Visit: Payer: Self-pay | Admitting: Nurse Practitioner

## 2023-09-07 NOTE — Telephone Encounter (Unsigned)
 Copied from CRM [id number]. Topic: Clinical - Request for Lab/Test Order >> Sep 07, 2023  4:00 PM Jasmin G wrote: Reason for CRM: Pt got a missed call from Mrs. Vicki Pasqual, please call patient back ASAP to relay lab results.

## 2023-10-01 ENCOUNTER — Encounter: Payer: Self-pay | Admitting: Nurse Practitioner

## 2023-10-01 ENCOUNTER — Ambulatory Visit (INDEPENDENT_AMBULATORY_CARE_PROVIDER_SITE_OTHER): Payer: Self-pay | Admitting: Nurse Practitioner

## 2023-10-01 VITALS — BP 133/74 | HR 76 | Temp 98.6°F | Wt 196.0 lb

## 2023-10-01 DIAGNOSIS — I10 Essential (primary) hypertension: Secondary | ICD-10-CM

## 2023-10-01 NOTE — Progress Notes (Signed)
 Subjective   Patient ID: Mark Thornton, male    DOB: 19-Mar-1970, 53 y.o.   MRN: [id number]  Chief Complaint  Patient presents with   Medical Management of Chronic Issues    Referring provider: Oley Bascom RAMAN, NP  Mark Thornton is a 53 y.o. male with Past Medical History: No date: Alcohol abuse No date: Hypertension No date: Substance abuse [facility])    HPI   Patient presents today for follow-up visit.  Patient does have a history of hypertension and alcohol use disorder.  Overall patient has been doing well.  He does need a refill on blood pressure medicine.  He states that he has been taking multivitamin and  vitamin D  supplement.  Denies f/c/s, n/v/d, hemoptysis, PND, leg swelling. Denies chest pain or edema.     No Known Allergies   There is no immunization history on file for this patient.  Tobacco History: Social History   Tobacco Use  Smoking Status Every Day   Current packs/day: 0.25   Types: Cigarettes  Smokeless Tobacco Never  Tobacco Comments   Smokes 6 cigarettes a day- has nicotine  patches, which he uses sometimes   Pt resides at [facility]   Ready to quit: Not Answered Counseling given: Yes Tobacco comments: Smokes 6 cigarettes a day- has nicotine  patches, which he uses sometimes Pt resides at [facility]   Outpatient Encounter Medications as of 10/01/2023  Medication Sig   amLODipine  (NORVASC ) 10 MG tablet Take 1 tablet (10 mg total) by mouth daily.   atorvastatin  (LIPITOR) 10 MG tablet Take 1 tablet (10 mg total) by mouth daily.   Multiple Vitamin (MULTIVITAMIN WITH MINERALS) TABS tablet Take 1 tablet by mouth daily.   Vitamin D , Ergocalciferol , (DRISDOL ) 1.25 MG (50000 UNIT) CAPS capsule Take 1 capsule (50,000 Units total) by mouth every 7 (seven) days.   cetirizine  (ZYRTEC ) 10 MG tablet Take 1 tablet (10 mg total) by mouth daily. (Patient not taking: Reported on 04/02/2023)   Cholecalciferol (VITAMIN D3 PO) Take 1 tablet by mouth daily. (Patient not  taking: Reported on 07/02/2023)   doxycycline  (VIBRAMYCIN ) 100 MG capsule Take 1 capsule (100 mg total) by mouth 2 (two) times daily. (Patient not taking: Reported on 09/04/2023)   nicotine  (NICODERM CQ  - DOSED IN MG/24 HOURS) 21 mg/24hr patch Place 1 patch (21 mg total) onto the skin daily. (Patient not taking: Reported on 04/02/2023)   No facility-administered encounter medications on file as of 10/01/2023.    Review of Systems  Review of Systems  Constitutional: Negative.   HENT: Negative.    Cardiovascular: Negative.   Gastrointestinal: Negative.   Allergic/Immunologic: Negative.   Neurological: Negative.   Psychiatric/Behavioral: Negative.       Objective:   BP 133/74   Pulse 76   Temp 98.6 F (37 C) (Oral)   Wt 196 lb (88.9 kg)   SpO2 100%   BMI 28.12 kg/m   Wt Readings from Last 5 Encounters:  10/01/23 196 lb (88.9 kg)  09/04/23 198 lb 3.2 oz (89.9 kg)  07/02/23 198 lb 3.2 oz (89.9 kg)  04/02/23 196 lb 3.2 oz (89 kg)  04/02/22 208 lb 6.4 oz (94.5 kg)     Physical Exam Vitals and nursing note reviewed.  Constitutional:      General: He is not in acute distress.    Appearance: He is well-developed.  Cardiovascular:     Rate and Rhythm: Normal rate and regular rhythm.  Pulmonary:     Effort: Pulmonary effort is normal.  Breath sounds: Normal breath sounds.  Skin:    General: Skin is warm and dry.  Neurological:     Mental Status: He is alert and oriented to person, place, and time.       Assessment & Plan:   Essential hypertension     Return in about 6 months (around 04/02/2024).     Bascom GORMAN Borer, NP 10/01/2023

## 2023-10-15 ENCOUNTER — Telehealth: Payer: Self-pay | Admitting: Nurse Practitioner

## 2023-10-15 NOTE — Telephone Encounter (Signed)
 Copied from CRM [id number]. Topic: Clinical - Medication Refill >> Oct 15, 2023  2:46 PM Harlene ORN wrote: Medication: amLODipine  (NORVASC ) 10 MG tablet (would like 90 day supply)  Has the patient contacted their pharmacy? Yes (Agent: If no, request that the patient contact the pharmacy for the refill. If patient does not wish to contact the pharmacy document the reason why and proceed with request.) (Agent: If yes, when and what did the pharmacy advise?)  This is the patient's preferred pharmacy:  [facility] - [place], [place] - [address] [place] [address] Phone: [phone] Fax: [phone]  Is this the correct pharmacy for this prescription? Yes If no, delete pharmacy and type the correct one.   Has the prescription been filled recently? No  Is the patient out of the medication? No  Has the patient been seen for an appointment in the last year OR does the patient have an upcoming appointment? Yes  Can we respond through MyChart? No  Agent: Please be advised that Rx refills may take up to 3 business days. We ask that you follow-up with your pharmacy.

## 2023-10-16 ENCOUNTER — Other Ambulatory Visit: Payer: Self-pay

## 2023-10-16 DIAGNOSIS — I10 Essential (primary) hypertension: Secondary | ICD-10-CM

## 2023-10-16 MED ORDER — AMLODIPINE BESYLATE 10 MG PO TABS: 10.0000 mg | ORAL_TABLET | Freq: Every day | ORAL | 0 refills | Status: DC

## 2023-10-16 NOTE — Telephone Encounter (Signed)
 Med was sent in to pharmacy. Kh

## 2023-11-12 IMAGING — CT CT HEAD W/O CM
3 series · 16 of 47 positions shown, 19 images · non-contrast
Comparison: Prior head CT 11/25/2020

CLINICAL DATA: Mental status changes, possible overdose

EXAM:
CT HEAD WITHOUT CONTRAST
TECHNIQUE: Contiguous axial images were obtained from the base of the skull
through the vertex without intravenous contrast.

[Series 3: head wo · axial · 0.47mm/px · z∈[+1477,+1617]mm · 10 of 34 slices shown, 13 images]
[im 3/34  brain]
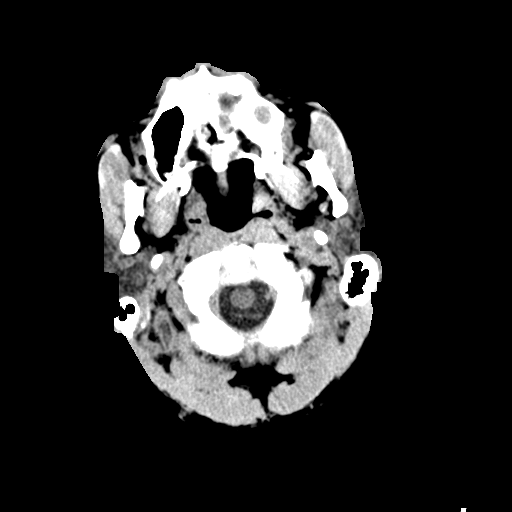
[im 3/34  bone]
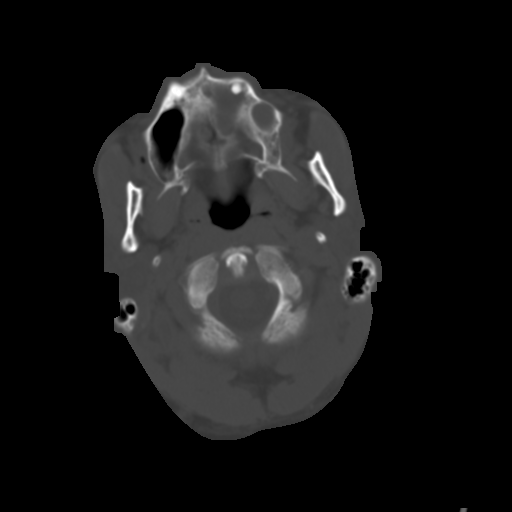
[im 6/34  brain]
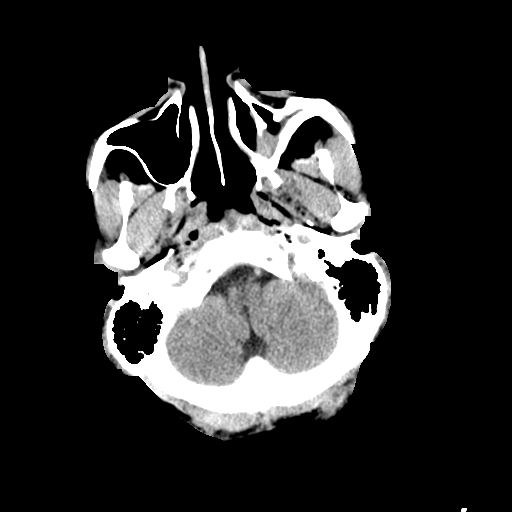
[im 10/34  brain]
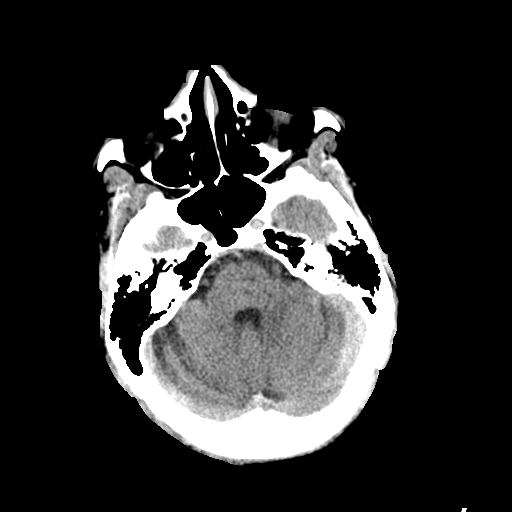
[im 12/34  brain]
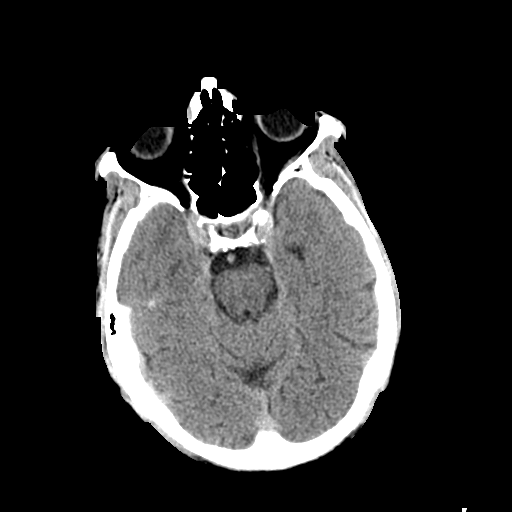
[im 15/34  brain]
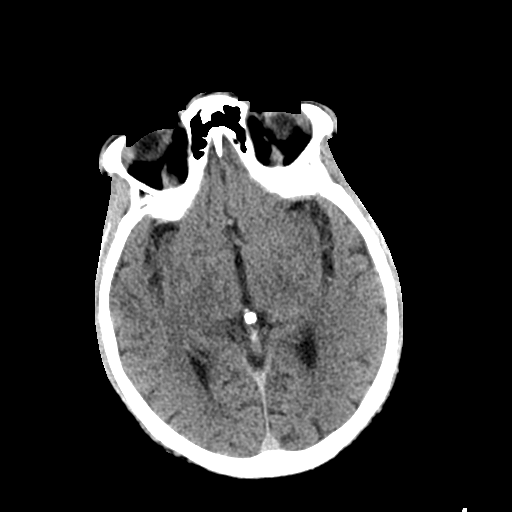
[im 15/34  bone]
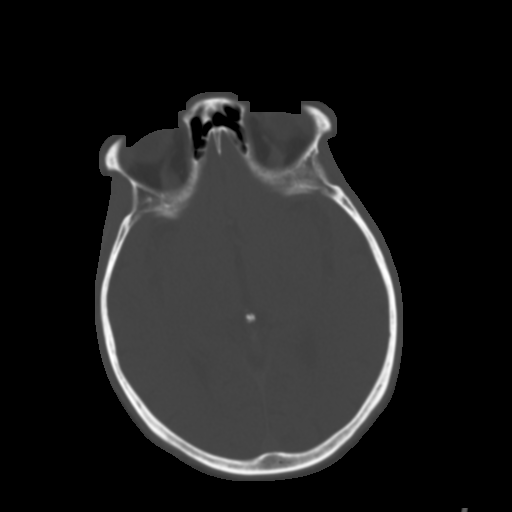
[im 19/34  brain]
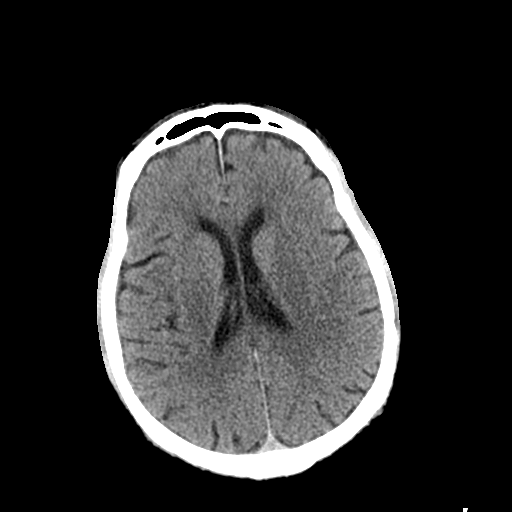
[im 22/34  brain]
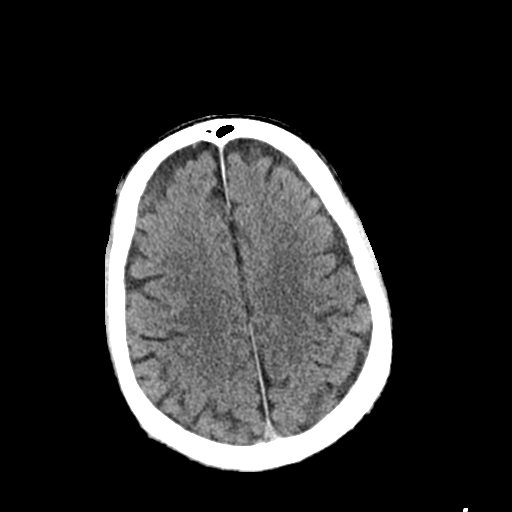
[im 26/34  brain]
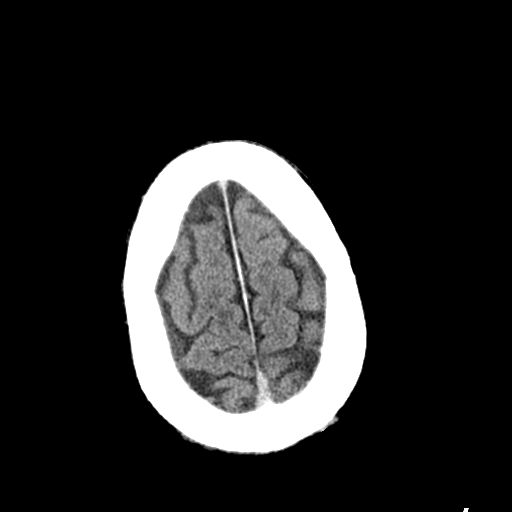
[im 28/34  brain]
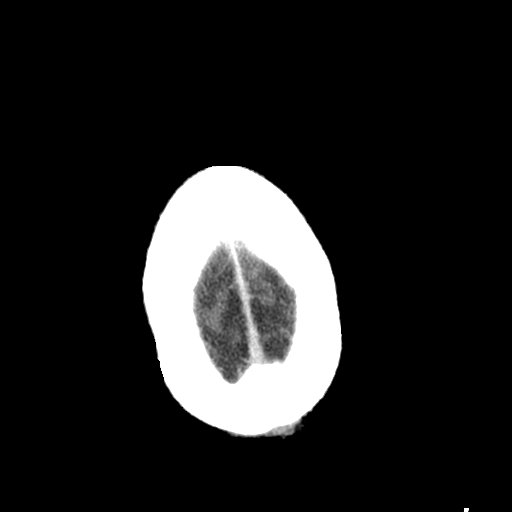
[im 28/34  bone]
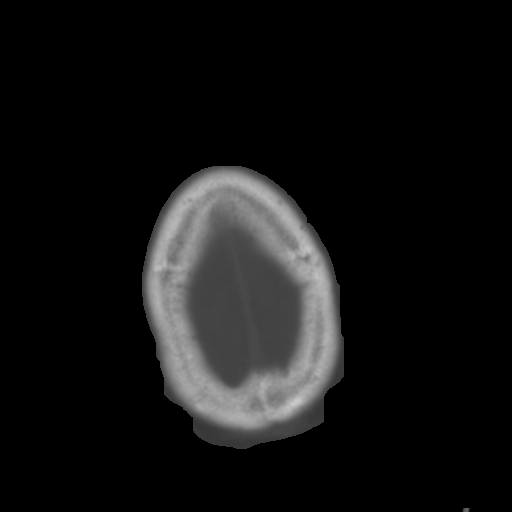
[im 31/34  brain]
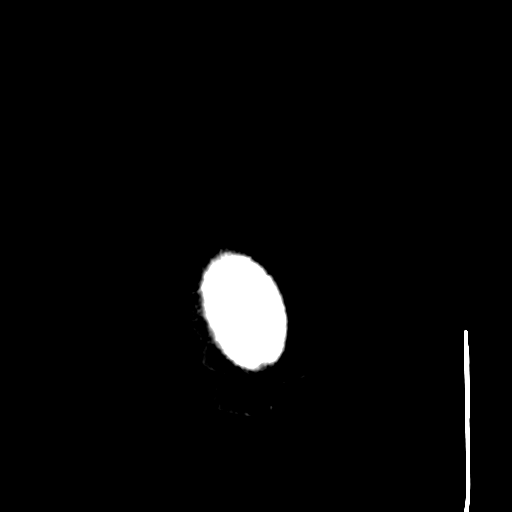

[Series 5: coronal soft tissue · coronal · 0.33mm/px · 3 of 73 slices shown]
[im 25/73  brain]
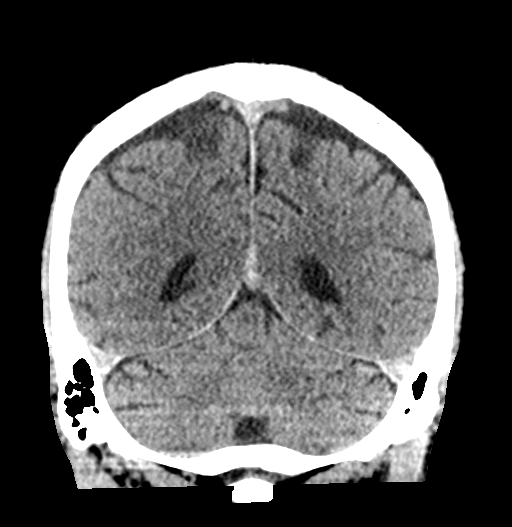
[im 33/73  brain]
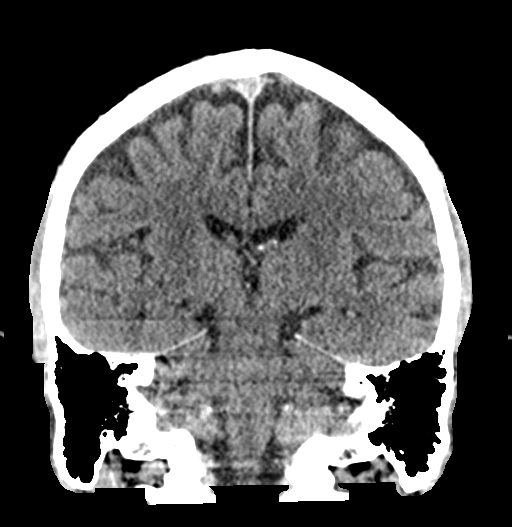
[im 41/73  brain]
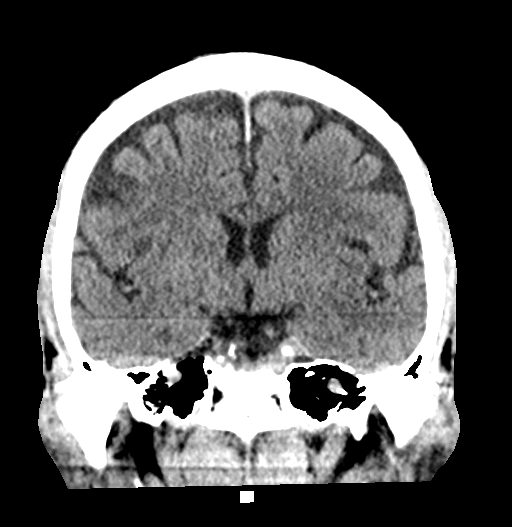

[Series 6: sagittal soft tissue · sagittal · 0.34mm/px · 3 of 56 slices shown]
[im 19/56  brain]
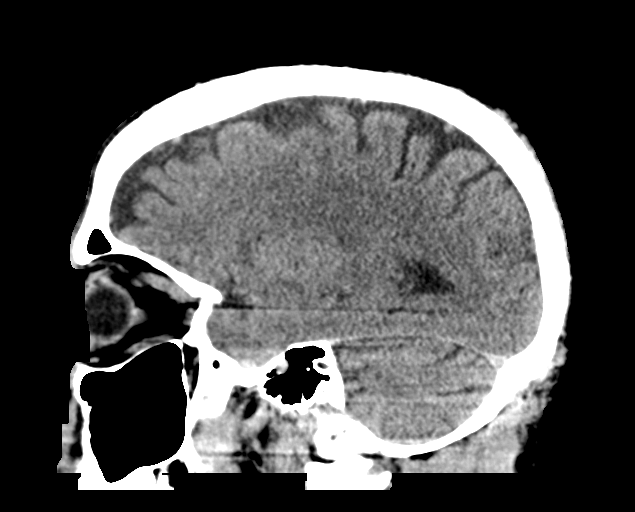
[im 28/56  brain]
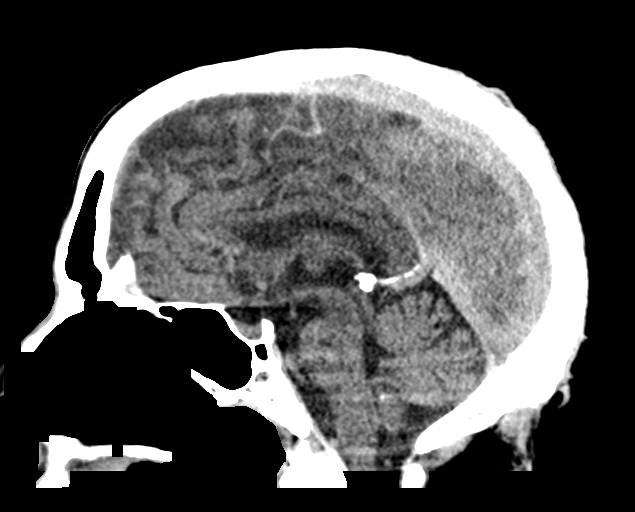
[im 37/56  brain]
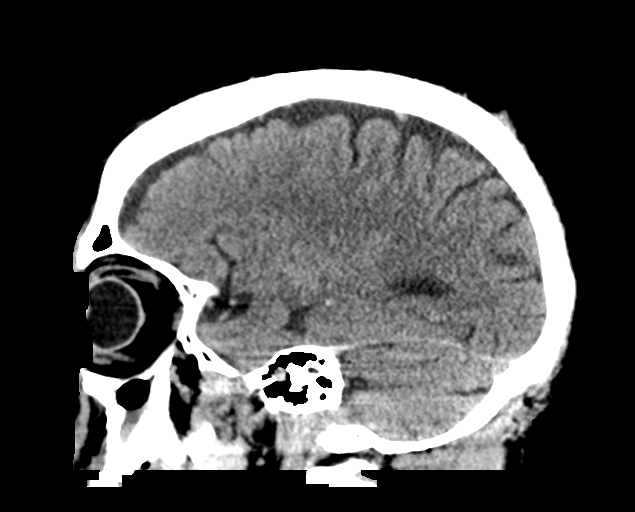

[16 of 47 positions shown; findings below may reference images not displayed]

FINDINGS: Brain: No evidence of acute infarction, hemorrhage, hydrocephalus,
extra-axial collection or mass lesion/mass effect.

Vascular: No hyperdense vessel or unexpected calcification.

Skull: Normal. Negative for fracture or focal lesion.

Sinuses/Orbits: Partial opacification of the left maxillary sinus is
similar compared to prior. Evidence of prior remote left maxillary
anterior wall fracture.

Other: None.
IMPRESSION: 1. No acute intracranial abnormality.
2. Chronic partial opacification of the left maxillary sinus.

## 2023-11-16 ENCOUNTER — Other Ambulatory Visit: Payer: Self-pay | Admitting: Nurse Practitioner

## 2023-11-16 DIAGNOSIS — E785 Hyperlipidemia, unspecified: Secondary | ICD-10-CM

## 2023-11-16 DIAGNOSIS — I10 Essential (primary) hypertension: Secondary | ICD-10-CM

## 2023-11-16 MED ORDER — ATORVASTATIN CALCIUM 10 MG PO TABS: 10.0000 mg | ORAL_TABLET | Freq: Every day | ORAL | 1 refills | Status: AC

## 2023-11-16 MED ORDER — AMLODIPINE BESYLATE 10 MG PO TABS: 10.0000 mg | ORAL_TABLET | Freq: Every day | ORAL | 0 refills | Status: DC

## 2023-11-16 NOTE — Telephone Encounter (Signed)
 Copied from CRM #[id number]. Topic: Clinical - Medication Refill >> Nov 16, 2023  4:01 PM Wess S wrote: Medication: amLODipine  (NORVASC ) 10 MG tablet  atorvastatin  (LIPITOR) 10 MG tablet    Has the patient contacted their pharmacy? No (Agent: If no, request that the patient contact the pharmacy for the refill. If patient does not wish to contact the pharmacy document the reason why and proceed with request.) (Agent: If yes, when and what did the pharmacy advise?)  This is the patient's preferred pharmacy:  [facility] - [place], [place] - [address] [place] [address] Phone: [phone] Fax: [phone]  Is this the correct pharmacy for this prescription? Yes If no, delete pharmacy and type the correct one.   Has the prescription been filled recently? Yes  Is the patient out of the medication? No  Has the patient been seen for an appointment in the last year OR does the patient have an upcoming appointment? Yes  Can we respond through MyChart? No  Agent: Please be advised that Rx refills may take up to 3 business days. We ask that you follow-up with your pharmacy.

## 2024-01-25 ENCOUNTER — Other Ambulatory Visit: Payer: Self-pay

## 2024-02-25 ENCOUNTER — Other Ambulatory Visit: Payer: Self-pay | Admitting: Nurse Practitioner

## 2024-02-25 DIAGNOSIS — I10 Essential (primary) hypertension: Secondary | ICD-10-CM

## 2024-02-25 NOTE — Telephone Encounter (Signed)
 amLODipine  (NORVASC ) 10 MG tablet [Pharmacy Med Name: amlodipine  10 mg tablet]

## 2024-03-22 ENCOUNTER — Ambulatory Visit (INDEPENDENT_AMBULATORY_CARE_PROVIDER_SITE_OTHER)

## 2024-03-22 ENCOUNTER — Ambulatory Visit
Admission: EM | Admit: 2024-03-22 | Discharge: 2024-03-22 | Disposition: A | Discharge status: Home / Self Care | Source: Ambulatory Visit

## 2024-03-22 ENCOUNTER — Ambulatory Visit: Payer: Self-pay | Admitting: Nurse Practitioner

## 2024-03-22 DIAGNOSIS — R0789 Other chest pain: Secondary | ICD-10-CM | POA: Diagnosis not present

## 2024-03-22 DIAGNOSIS — S298XXA Other specified injuries of thorax, initial encounter: Secondary | ICD-10-CM

## 2024-03-22 MED ORDER — IBUPROFEN 600 MG PO TABS: 600.0000 mg | ORAL_TABLET | Freq: Three times a day (TID) | ORAL | 0 refills | Status: AC | PRN

## 2024-03-22 NOTE — ED Triage Notes (Signed)
 Pt present with lt shoulder and side pain x 4 days.  Pt fell last Friday on ice. States he went to work and could not bear the pain. Pt states he fell really hard and landed on the lt shoulder. Pt states when he coughs the pain is triggered.

## 2024-04-08 ENCOUNTER — Ambulatory Visit: Payer: Self-pay | Admitting: Nurse Practitioner
